# Patient Record
Sex: Female | Born: 1955
Health system: Southern US, Community
[De-identification: ages and names within clinical notes are randomized; demographics above are authoritative.]

## PROBLEM LIST (undated history)

## (undated) ENCOUNTER — Ambulatory Visit

## (undated) DIAGNOSIS — E785 Hyperlipidemia, unspecified: Secondary | ICD-10-CM

## (undated) DIAGNOSIS — I10 Essential (primary) hypertension: Secondary | ICD-10-CM

## (undated) DIAGNOSIS — C4491 Basal cell carcinoma of skin, unspecified: Secondary | ICD-10-CM

## (undated) DIAGNOSIS — M810 Age-related osteoporosis without current pathological fracture: Secondary | ICD-10-CM

## (undated) DIAGNOSIS — B019 Varicella without complication: Secondary | ICD-10-CM

## (undated) DIAGNOSIS — F419 Anxiety disorder, unspecified: Secondary | ICD-10-CM

## (undated) DIAGNOSIS — R1310 Dysphagia, unspecified: Secondary | ICD-10-CM

## (undated) DIAGNOSIS — K921 Melena: Secondary | ICD-10-CM

## (undated) DIAGNOSIS — K219 Gastro-esophageal reflux disease without esophagitis: Secondary | ICD-10-CM

## (undated) DIAGNOSIS — I341 Nonrheumatic mitral (valve) prolapse: Secondary | ICD-10-CM

## (undated) DIAGNOSIS — K828 Other specified diseases of gallbladder: Secondary | ICD-10-CM

## (undated) DIAGNOSIS — I471 Supraventricular tachycardia, unspecified: Secondary | ICD-10-CM

## (undated) HISTORY — DX: Varicella without complication: B01.9

## (undated) HISTORY — DX: Gastro-esophageal reflux disease without esophagitis: K21.9

## (undated) HISTORY — DX: Age-related osteoporosis without current pathological fracture: M81.0

## (undated) HISTORY — DX: Basal cell carcinoma of skin, unspecified: C44.91

## (undated) HISTORY — PX: TONSILLECTOMY: SUR1361

## (undated) HISTORY — DX: Hyperlipidemia, unspecified: E78.5

## (undated) HISTORY — DX: Melena: K92.1

## (undated) HISTORY — DX: Other specified diseases of gallbladder: K82.8

## (undated) HISTORY — DX: Supraventricular tachycardia, unspecified: I47.10

## (undated) HISTORY — DX: Anxiety disorder, unspecified: F41.9

## (undated) HISTORY — DX: Essential (primary) hypertension: I10

---

## 1999-02-24 HISTORY — PX: BREAST BIOPSY: SHX20

## 2013-02-23 HISTORY — PX: COLON SURGERY: SHX602

## 2013-02-23 HISTORY — PX: APPENDECTOMY: SHX54

## 2013-09-24 ENCOUNTER — Encounter (HOSPITAL_COMMUNITY): Payer: Self-pay | Admitting: Emergency Medicine

## 2013-09-24 DIAGNOSIS — K560 Paralytic ileus: Secondary | ICD-10-CM | POA: Diagnosis not present

## 2013-09-24 DIAGNOSIS — I519 Heart disease, unspecified: Secondary | ICD-10-CM | POA: Diagnosis not present

## 2013-09-24 DIAGNOSIS — R1031 Right lower quadrant pain: Secondary | ICD-10-CM | POA: Diagnosis not present

## 2013-09-24 DIAGNOSIS — R1310 Dysphagia, unspecified: Secondary | ICD-10-CM | POA: Diagnosis present

## 2013-09-24 DIAGNOSIS — T40605A Adverse effect of unspecified narcotics, initial encounter: Secondary | ICD-10-CM | POA: Diagnosis not present

## 2013-09-24 DIAGNOSIS — Z7982 Long term (current) use of aspirin: Secondary | ICD-10-CM

## 2013-09-24 DIAGNOSIS — I059 Rheumatic mitral valve disease, unspecified: Secondary | ICD-10-CM | POA: Diagnosis present

## 2013-09-24 DIAGNOSIS — R110 Nausea: Secondary | ICD-10-CM | POA: Diagnosis not present

## 2013-09-24 DIAGNOSIS — Z5331 Laparoscopic surgical procedure converted to open procedure: Secondary | ICD-10-CM

## 2013-09-24 DIAGNOSIS — K3580 Unspecified acute appendicitis: Principal | ICD-10-CM | POA: Diagnosis present

## 2013-09-24 LAB — COMPREHENSIVE METABOLIC PANEL
ALK PHOS: 102 U/L (ref 39–117)
ALT: 15 U/L (ref 0–35)
AST: 18 U/L (ref 0–37)
Albumin: 4.2 g/dL (ref 3.5–5.2)
Anion gap: 15 (ref 5–15)
BUN: 11 mg/dL (ref 6–23)
CO2: 23 mEq/L (ref 19–32)
Calcium: 10 mg/dL (ref 8.4–10.5)
Chloride: 100 mEq/L (ref 96–112)
Creatinine, Ser: 0.78 mg/dL (ref 0.50–1.10)
GFR calc Af Amer: >90 mL/min (ref >90)
GFR, EST NON AFRICAN AMERICAN: 90 mL/min — AB (ref >90)
GLUCOSE: 115 mg/dL — AB (ref 70–99)
POTASSIUM: 4.3 meq/L (ref 3.7–5.3)
Sodium: 138 mEq/L (ref 137–147)
Total Bilirubin: 0.7 mg/dL (ref 0.3–1.2)
Total Protein: 7.8 g/dL (ref 6.0–8.3)

## 2013-09-24 LAB — URINALYSIS, ROUTINE W REFLEX MICROSCOPIC
Bilirubin Urine: NEGATIVE
GLUCOSE, UA: NEGATIVE mg/dL
KETONES UR: NEGATIVE mg/dL
Nitrite: NEGATIVE
Protein, ur: NEGATIVE mg/dL
Specific Gravity, Urine: 1.013 (ref 1.005–1.030)
Urobilinogen, UA: 0.2 mg/dL (ref 0.0–1.0)
pH: 7 (ref 5.0–8.0)

## 2013-09-24 LAB — CBC WITH DIFFERENTIAL/PLATELET
BASOS ABS: 0 10*3/uL (ref 0.0–0.1)
Basophils Relative: 0 % (ref 0–1)
Eosinophils Absolute: 0.2 10*3/uL (ref 0.0–0.7)
Eosinophils Relative: 1 % (ref 0–5)
HCT: 39.3 % (ref 36.0–46.0)
Hemoglobin: 13.6 g/dL (ref 12.0–15.0)
LYMPHS ABS: 1.7 10*3/uL (ref 0.7–4.0)
LYMPHS PCT: 11 % — AB (ref 12–46)
MCH: 31.8 pg (ref 26.0–34.0)
MCHC: 34.6 g/dL (ref 30.0–36.0)
MCV: 91.8 fL (ref 78.0–100.0)
Monocytes Absolute: 1.4 10*3/uL — ABNORMAL HIGH (ref 0.1–1.0)
Monocytes Relative: 9 % (ref 3–12)
NEUTROS ABS: 12.5 10*3/uL — AB (ref 1.7–7.7)
NEUTROS PCT: 79 % — AB (ref 43–77)
PLATELETS: 332 10*3/uL (ref 150–400)
RBC: 4.28 MIL/uL (ref 3.87–5.11)
RDW: 12.9 % (ref 11.5–15.5)
WBC: 15.8 10*3/uL — ABNORMAL HIGH (ref 4.0–10.5)

## 2013-09-24 LAB — LIPASE, BLOOD: Lipase: 30 U/L (ref 11–59)

## 2013-09-24 LAB — URINE MICROSCOPIC-ADD ON

## 2013-09-24 NOTE — ED Notes (Signed)
Pt reports starting on Thursday she started having mis abdominal pain that sometimes radiates to RLQ. Denies nvd. sts she had fever of 101 at home 2 hours ago.

## 2013-09-25 ENCOUNTER — Encounter (HOSPITAL_COMMUNITY): Payer: BC Managed Care – PPO | Admitting: Anesthesiology

## 2013-09-25 ENCOUNTER — Emergency Department (HOSPITAL_COMMUNITY): Payer: BC Managed Care – PPO

## 2013-09-25 ENCOUNTER — Inpatient Hospital Stay (HOSPITAL_COMMUNITY)
Admission: EM | Admit: 2013-09-25 | Discharge: 2013-10-01 | DRG: 330 | Disposition: A | Discharge status: Home / Self Care | Payer: BC Managed Care – PPO | Attending: Surgery | Admitting: Surgery

## 2013-09-25 ENCOUNTER — Observation Stay (HOSPITAL_COMMUNITY): Payer: BC Managed Care – PPO | Admitting: Anesthesiology

## 2013-09-25 ENCOUNTER — Encounter (HOSPITAL_COMMUNITY): Payer: Self-pay | Admitting: Emergency Medicine

## 2013-09-25 ENCOUNTER — Encounter (HOSPITAL_COMMUNITY): Admission: EM | Disposition: A | Discharge status: Home / Self Care | Payer: Self-pay | Source: Home / Self Care

## 2013-09-25 DIAGNOSIS — K3580 Unspecified acute appendicitis: Secondary | ICD-10-CM

## 2013-09-25 DIAGNOSIS — K37 Unspecified appendicitis: Secondary | ICD-10-CM

## 2013-09-25 DIAGNOSIS — Z9049 Acquired absence of other specified parts of digestive tract: Secondary | ICD-10-CM

## 2013-09-25 DIAGNOSIS — R1031 Right lower quadrant pain: Secondary | ICD-10-CM

## 2013-09-25 HISTORY — DX: Nonrheumatic mitral (valve) prolapse: I34.1

## 2013-09-25 HISTORY — PX: LAPAROSCOPIC APPENDECTOMY: SHX408

## 2013-09-25 HISTORY — DX: Dysphagia, unspecified: R13.10

## 2013-09-25 HISTORY — DX: Unspecified appendicitis: K37

## 2013-09-25 LAB — TROPONIN I

## 2013-09-25 LAB — SURGICAL PCR SCREEN
MRSA, PCR: NEGATIVE
Staphylococcus aureus: NEGATIVE

## 2013-09-25 SURGERY — APPENDECTOMY, LAPAROSCOPIC
Anesthesia: General | Site: Abdomen

## 2013-09-25 MED ORDER — ARTIFICIAL TEARS OP OINT
TOPICAL_OINTMENT | OPHTHALMIC | Status: AC
  Filled 2013-09-25: qty 3.5

## 2013-09-25 MED ORDER — PROPOFOL 10 MG/ML IV BOLUS
INTRAVENOUS | Status: DC | PRN
  Administered 2013-09-25: 200 mg via INTRAVENOUS

## 2013-09-25 MED ORDER — 0.9 % SODIUM CHLORIDE (POUR BTL) OPTIME
TOPICAL | Status: DC | PRN
  Administered 2013-09-25 (×2): 1000 mL

## 2013-09-25 MED ORDER — HYDROMORPHONE HCL PF 1 MG/ML IJ SOLN
INTRAMUSCULAR | Status: AC
  Administered 2013-09-25: 0.5 mg via INTRAVENOUS
  Filled 2013-09-25: qty 1

## 2013-09-25 MED ORDER — PANTOPRAZOLE SODIUM 40 MG IV SOLR
40.0000 mg | Freq: Every day | INTRAVENOUS | Status: DC
  Administered 2013-09-25 – 2013-09-27 (×3): 40 mg via INTRAVENOUS
  Filled 2013-09-25 (×6): qty 40

## 2013-09-25 MED ORDER — ONDANSETRON HCL 4 MG/2ML IJ SOLN
INTRAMUSCULAR | Status: AC
  Filled 2013-09-25: qty 2

## 2013-09-25 MED ORDER — LIDOCAINE HCL (CARDIAC) 20 MG/ML IV SOLN
INTRAVENOUS | Status: DC | PRN
  Administered 2013-09-25: 60 mg via INTRAVENOUS

## 2013-09-25 MED ORDER — GLYCOPYRROLATE 0.2 MG/ML IJ SOLN
INTRAMUSCULAR | Status: DC | PRN
  Administered 2013-09-25: 0.2 mg via INTRAVENOUS
  Administered 2013-09-25: 0.6 mg via INTRAVENOUS
  Administered 2013-09-25: 0.2 mg via INTRAVENOUS

## 2013-09-25 MED ORDER — SODIUM CHLORIDE 0.9 % IR SOLN
Status: DC | PRN
  Administered 2013-09-25: 1000 mL

## 2013-09-25 MED ORDER — NEOSTIGMINE METHYLSULFATE 10 MG/10ML IV SOLN
INTRAVENOUS | Status: AC
  Filled 2013-09-25: qty 1

## 2013-09-25 MED ORDER — ONDANSETRON HCL 4 MG/2ML IJ SOLN
4.0000 mg | Freq: Once | INTRAMUSCULAR | Status: AC
  Administered 2013-09-25: 4 mg via INTRAVENOUS
  Filled 2013-09-25: qty 2

## 2013-09-25 MED ORDER — BUPIVACAINE-EPINEPHRINE 0.25% -1:200000 IJ SOLN
INTRAMUSCULAR | Status: DC | PRN
  Administered 2013-09-25: 30 mL

## 2013-09-25 MED ORDER — LIDOCAINE HCL (CARDIAC) 20 MG/ML IV SOLN
INTRAVENOUS | Status: AC
  Filled 2013-09-25: qty 5

## 2013-09-25 MED ORDER — DEXTROSE 5 % IV SOLN
2.0000 g | INTRAVENOUS | Status: DC
  Administered 2013-09-25 – 2013-09-30 (×6): 2 g via INTRAVENOUS
  Filled 2013-09-25 (×7): qty 2

## 2013-09-25 MED ORDER — PROPOFOL 10 MG/ML IV BOLUS
INTRAVENOUS | Status: AC
  Filled 2013-09-25: qty 20

## 2013-09-25 MED ORDER — ONDANSETRON HCL 4 MG/2ML IJ SOLN: 4.0000 mg | Freq: Once | INTRAMUSCULAR | Status: DC | PRN

## 2013-09-25 MED ORDER — MORPHINE SULFATE 4 MG/ML IJ SOLN
4.0000 mg | Freq: Once | INTRAMUSCULAR | Status: AC
  Administered 2013-09-25: 4 mg via INTRAVENOUS
  Filled 2013-09-25: qty 1

## 2013-09-25 MED ORDER — ARTIFICIAL TEARS OP OINT
TOPICAL_OINTMENT | OPHTHALMIC | Status: DC | PRN
  Administered 2013-09-25: 1 via OPHTHALMIC

## 2013-09-25 MED ORDER — FENTANYL CITRATE 0.05 MG/ML IJ SOLN
INTRAMUSCULAR | Status: AC
  Filled 2013-09-25: qty 5

## 2013-09-25 MED ORDER — ENOXAPARIN SODIUM 40 MG/0.4ML ~~LOC~~ SOLN
40.0000 mg | SUBCUTANEOUS | Status: DC
  Administered 2013-09-26 – 2013-09-30 (×5): 40 mg via SUBCUTANEOUS
  Filled 2013-09-25 (×10): qty 0.4

## 2013-09-25 MED ORDER — FENTANYL CITRATE 0.05 MG/ML IJ SOLN
INTRAMUSCULAR | Status: DC | PRN
  Administered 2013-09-25 (×4): 50 ug via INTRAVENOUS
  Administered 2013-09-25: 100 ug via INTRAVENOUS

## 2013-09-25 MED ORDER — BUPIVACAINE-EPINEPHRINE (PF) 0.25% -1:200000 IJ SOLN
INTRAMUSCULAR | Status: AC
  Filled 2013-09-25: qty 30

## 2013-09-25 MED ORDER — KCL IN DEXTROSE-NACL 20-5-0.45 MEQ/L-%-% IV SOLN
INTRAVENOUS | Status: DC
  Administered 2013-09-25 – 2013-09-26 (×4): via INTRAVENOUS
  Filled 2013-09-25 (×9): qty 1000

## 2013-09-25 MED ORDER — GLYCOPYRROLATE 0.2 MG/ML IJ SOLN
INTRAMUSCULAR | Status: AC
  Filled 2013-09-25: qty 3

## 2013-09-25 MED ORDER — LACTATED RINGERS IV SOLN
INTRAVENOUS | Status: DC | PRN
  Administered 2013-09-25 (×2): via INTRAVENOUS

## 2013-09-25 MED ORDER — ROCURONIUM BROMIDE 50 MG/5ML IV SOLN
INTRAVENOUS | Status: AC
  Filled 2013-09-25: qty 1

## 2013-09-25 MED ORDER — KCL IN DEXTROSE-NACL 20-5-0.45 MEQ/L-%-% IV SOLN
INTRAVENOUS | Status: AC
Start: 2013-09-25 — End: 2013-09-26
  Filled 2013-09-25: qty 1000

## 2013-09-25 MED ORDER — IOHEXOL 300 MG/ML  SOLN
100.0000 mL | Freq: Once | INTRAMUSCULAR | Status: AC | PRN
  Administered 2013-09-25: 100 mL via INTRAVENOUS

## 2013-09-25 MED ORDER — SCOPOLAMINE 1 MG/3DAYS TD PT72
MEDICATED_PATCH | TRANSDERMAL | Status: AC
  Filled 2013-09-25: qty 1

## 2013-09-25 MED ORDER — PROPOFOL INFUSION 10 MG/ML OPTIME
INTRAVENOUS | Status: DC | PRN
  Administered 2013-09-25: 25 ug/kg/min via INTRAVENOUS

## 2013-09-25 MED ORDER — LIDOCAINE HCL 4 % MT SOLN
OROMUCOSAL | Status: DC | PRN
  Administered 2013-09-25: 4 mL via TOPICAL

## 2013-09-25 MED ORDER — ROCURONIUM BROMIDE 100 MG/10ML IV SOLN
INTRAVENOUS | Status: DC | PRN
  Administered 2013-09-25: 35 mg via INTRAVENOUS
  Administered 2013-09-25: 15 mg via INTRAVENOUS

## 2013-09-25 MED ORDER — ONDANSETRON HCL 4 MG/2ML IJ SOLN
INTRAMUSCULAR | Status: DC | PRN
  Administered 2013-09-25 (×2): 4 mg via INTRAVENOUS

## 2013-09-25 MED ORDER — PHENYLEPHRINE HCL 10 MG/ML IJ SOLN
INTRAMUSCULAR | Status: AC
  Filled 2013-09-25: qty 1

## 2013-09-25 MED ORDER — SCOPOLAMINE 1 MG/3DAYS TD PT72
MEDICATED_PATCH | TRANSDERMAL | Status: DC | PRN
  Administered 2013-09-25: 1 via TRANSDERMAL

## 2013-09-25 MED ORDER — MORPHINE SULFATE 2 MG/ML IJ SOLN
2.0000 mg | INTRAMUSCULAR | Status: DC | PRN
  Administered 2013-09-25: 2 mg via INTRAVENOUS
  Administered 2013-09-26 (×2): 4 mg via INTRAVENOUS
  Filled 2013-09-25: qty 2
  Filled 2013-09-25: qty 1
  Filled 2013-09-25: qty 2

## 2013-09-25 MED ORDER — MORPHINE SULFATE 2 MG/ML IJ SOLN
2.0000 mg | INTRAMUSCULAR | Status: DC | PRN
  Administered 2013-09-25 (×2): 4 mg via INTRAVENOUS
  Filled 2013-09-25 (×2): qty 2

## 2013-09-25 MED ORDER — HYDROMORPHONE HCL PF 1 MG/ML IJ SOLN
0.2500 mg | INTRAMUSCULAR | Status: DC | PRN
Start: 2013-09-25 — End: 2013-09-25
  Administered 2013-09-25 (×4): 0.5 mg via INTRAVENOUS

## 2013-09-25 MED ORDER — NEOSTIGMINE METHYLSULFATE 10 MG/10ML IV SOLN
INTRAVENOUS | Status: DC | PRN
  Administered 2013-09-25: 4 mg via INTRAVENOUS

## 2013-09-25 MED ORDER — ONDANSETRON HCL 4 MG/2ML IJ SOLN
4.0000 mg | Freq: Four times a day (QID) | INTRAMUSCULAR | Status: DC | PRN
  Administered 2013-09-25 – 2013-09-28 (×8): 4 mg via INTRAVENOUS
  Filled 2013-09-25 (×9): qty 2

## 2013-09-25 MED ORDER — METRONIDAZOLE IN NACL 5-0.79 MG/ML-% IV SOLN
500.0000 mg | Freq: Three times a day (TID) | INTRAVENOUS | Status: DC
  Administered 2013-09-25 – 2013-09-30 (×15): 500 mg via INTRAVENOUS
  Filled 2013-09-25 (×21): qty 100

## 2013-09-25 MED ORDER — IOHEXOL 300 MG/ML  SOLN
25.0000 mL | INTRAMUSCULAR | Status: DC
  Administered 2013-09-25: 25 mL via ORAL

## 2013-09-25 MED ORDER — ALBUMIN HUMAN 5 % IV SOLN
INTRAVENOUS | Status: DC | PRN
  Administered 2013-09-25: 12:00:00 via INTRAVENOUS

## 2013-09-25 SURGICAL SUPPLY — 79 items
APPLIER CLIP ROT 10 11.4 M/L (STAPLE)
BENZOIN TINCTURE PRP APPL 2/3 (GAUZE/BANDAGES/DRESSINGS) IMPLANT
BLADE SURG 10 STRL SS (BLADE) ×3 IMPLANT
BLADE SURG ROTATE 9660 (MISCELLANEOUS) IMPLANT
CANISTER SUCTION 2500CC (MISCELLANEOUS) ×3 IMPLANT
CELLS DAT CNTRL 66122 CELL SVR (MISCELLANEOUS) ×1 IMPLANT
CHLORAPREP W/TINT 26ML (MISCELLANEOUS) ×3 IMPLANT
CLIP APPLIE ROT 10 11.4 M/L (STAPLE) IMPLANT
CONT SPEC 4OZ CLIKSEAL STRL BL (MISCELLANEOUS) ×3 IMPLANT
COVER SURGICAL LIGHT HANDLE (MISCELLANEOUS) ×3 IMPLANT
CUTTER FLEX LINEAR 45M (STAPLE) ×3 IMPLANT
DERMABOND ADVANCED (GAUZE/BANDAGES/DRESSINGS)
DERMABOND ADVANCED .7 DNX12 (GAUZE/BANDAGES/DRESSINGS) IMPLANT
DRAPE UTILITY 15X26 W/TAPE STR (DRAPE) ×6 IMPLANT
ELECT CAUTERY BLADE 6.4 (BLADE) ×3 IMPLANT
ELECT REM PT RETURN 9FT ADLT (ELECTROSURGICAL) ×3
ELECTRODE REM PT RTRN 9FT ADLT (ELECTROSURGICAL) ×1 IMPLANT
ENDOLOOP SUT PDS II  0 18 (SUTURE)
ENDOLOOP SUT PDS II 0 18 (SUTURE) IMPLANT
GAUZE SPONGE 2X2 8PLY STRL LF (GAUZE/BANDAGES/DRESSINGS) IMPLANT
GLOVE BIOGEL M STRL SZ7.5 (GLOVE) ×6 IMPLANT
GLOVE BIOGEL PI IND STRL 6 (GLOVE) ×1 IMPLANT
GLOVE BIOGEL PI IND STRL 6.5 (GLOVE) ×1 IMPLANT
GLOVE BIOGEL PI IND STRL 7.0 (GLOVE) ×3 IMPLANT
GLOVE BIOGEL PI IND STRL 8 (GLOVE) ×1 IMPLANT
GLOVE BIOGEL PI INDICATOR 6 (GLOVE) ×2
GLOVE BIOGEL PI INDICATOR 6.5 (GLOVE) ×2
GLOVE BIOGEL PI INDICATOR 7.0 (GLOVE) ×6
GLOVE BIOGEL PI INDICATOR 8 (GLOVE) ×2
GLOVE ECLIPSE 6.5 STRL STRAW (GLOVE) ×3 IMPLANT
GLOVE SS BIOGEL STRL SZ 6.5 (GLOVE) ×2 IMPLANT
GLOVE SS BIOGEL STRL SZ 7.5 (GLOVE) ×2 IMPLANT
GLOVE SUPERSENSE BIOGEL SZ 6.5 (GLOVE) ×4
GLOVE SUPERSENSE BIOGEL SZ 7.5 (GLOVE) ×4
GLOVE SURG SS PI 7.0 STRL IVOR (GLOVE) ×9 IMPLANT
GOWN STRL REUS W/ TWL LRG LVL3 (GOWN DISPOSABLE) ×3 IMPLANT
GOWN STRL REUS W/ TWL XL LVL3 (GOWN DISPOSABLE) ×1 IMPLANT
GOWN STRL REUS W/TWL LRG LVL3 (GOWN DISPOSABLE) ×6
GOWN STRL REUS W/TWL XL LVL3 (GOWN DISPOSABLE) ×2
KIT BASIN OR (CUSTOM PROCEDURE TRAY) ×3 IMPLANT
KIT ROOM TURNOVER OR (KITS) ×3 IMPLANT
LIGASURE IMPACT 36 18CM CVD LR (INSTRUMENTS) ×3 IMPLANT
NS IRRIG 1000ML POUR BTL (IV SOLUTION) ×3 IMPLANT
PAD ABD 8X10 STRL (GAUZE/BANDAGES/DRESSINGS) ×3 IMPLANT
PAD ARMBOARD 7.5X6 YLW CONV (MISCELLANEOUS) ×6 IMPLANT
PENCIL BUTTON HOLSTER BLD 10FT (ELECTRODE) ×6 IMPLANT
POUCH SPECIMEN RETRIEVAL 10MM (ENDOMECHANICALS) ×3 IMPLANT
RELOAD 45 VASCULAR/THIN (ENDOMECHANICALS) ×3 IMPLANT
RELOAD PROXIMATE 75MM BLUE (ENDOMECHANICALS) ×6 IMPLANT
RELOAD STAPLE TA45 3.5 REG BLU (ENDOMECHANICALS) IMPLANT
RTRCTR WOUND ALEXIS 18CM MED (MISCELLANEOUS) ×3
SCALPEL HARMONIC ACE (MISCELLANEOUS) ×3 IMPLANT
SCISSORS LAP 5X35 DISP (ENDOMECHANICALS) ×3 IMPLANT
SET IRRIG TUBING LAPAROSCOPIC (IRRIGATION / IRRIGATOR) ×3 IMPLANT
SPECIMEN JAR LARGE (MISCELLANEOUS) ×3 IMPLANT
SPECIMEN JAR SMALL (MISCELLANEOUS) ×3 IMPLANT
SPONGE GAUZE 2X2 STER 10/PKG (GAUZE/BANDAGES/DRESSINGS)
SPONGE GAUZE 4X4 12PLY STER LF (GAUZE/BANDAGES/DRESSINGS) ×3 IMPLANT
SPONGE LAP 18X18 X RAY DECT (DISPOSABLE) ×3 IMPLANT
STAPLER GUN LINEAR PROX 60 (STAPLE) ×3 IMPLANT
STAPLER PROXIMATE 75MM BLUE (STAPLE) ×3 IMPLANT
SUT MNCRL AB 4-0 PS2 18 (SUTURE) ×3 IMPLANT
SUT NOVA 1 T20/GS 25DT (SUTURE) ×3 IMPLANT
SUT SILK 2 0 (SUTURE) ×2
SUT SILK 2 0 SH CR/8 (SUTURE) ×3 IMPLANT
SUT SILK 2-0 18XBRD TIE 12 (SUTURE) ×1 IMPLANT
SUT SILK 3 0 (SUTURE) ×2
SUT SILK 3 0 SH CR/8 (SUTURE) ×3 IMPLANT
SUT SILK 3-0 18XBRD TIE 12 (SUTURE) ×1 IMPLANT
SUT VICRYL 0 UR6 27IN ABS (SUTURE) IMPLANT
TOWEL OR 17X24 6PK STRL BLUE (TOWEL DISPOSABLE) IMPLANT
TOWEL OR 17X26 10 PK STRL BLUE (TOWEL DISPOSABLE) ×3 IMPLANT
TRAY FOLEY CATH 16FR SILVER (SET/KITS/TRAYS/PACK) ×3 IMPLANT
TRAY LAPAROSCOPIC (CUSTOM PROCEDURE TRAY) ×3 IMPLANT
TROCAR XCEL BLADELESS 5X75MML (TROCAR) ×6 IMPLANT
TROCAR XCEL BLUNT TIP 100MML (ENDOMECHANICALS) ×3 IMPLANT
TUBE CONNECTING 12'X1/4 (SUCTIONS) ×1
TUBE CONNECTING 12X1/4 (SUCTIONS) ×2 IMPLANT
YANKAUER SUCT BULB TIP NO VENT (SUCTIONS) ×6 IMPLANT

## 2013-09-25 NOTE — Anesthesia Procedure Notes (Signed)
Procedure Name: Intubation Date/Time: 09/25/2013 10:35 AM Performed by: Terrill Mohr Pre-anesthesia Checklist: Patient identified, Emergency Drugs available, Suction available and Patient being monitored Patient Re-evaluated:Patient Re-evaluated prior to inductionOxygen Delivery Method: Circle system utilized Preoxygenation: Pre-oxygenation with 100% oxygen Intubation Type: IV induction Ventilation: Mask ventilation without difficulty Laryngoscope Size: Mac and 3 Grade View: Grade II Tube type: Oral Tube size: 7.5 mm Number of attempts: 1 Airway Equipment and Method: Stylet Placement Confirmation: ETT inserted through vocal cords under direct vision,  breath sounds checked- equal and bilateral and positive ETCO2 Secured at: 22 (cm at teeth) cm Tube secured with: Tape Dental Injury: Teeth and Oropharynx as per pre-operative assessment

## 2013-09-25 NOTE — ED Notes (Signed)
Patient returned from CT

## 2013-09-25 NOTE — Transfer of Care (Signed)
Immediate Anesthesia Transfer of Care Note  Patient: Lenola Lockner  Procedure(s) Performed: Procedure(s):  LAPAROSCOPIC APPENDECTOMY CONVERTED TO LAPAROSCOPIC ASSISTED RIGHT COLECTOMY (N/A)  Patient Location: PACU  Anesthesia Type:General  Level of Consciousness: awake, sedated and patient cooperative  Airway & Oxygen Therapy: Patient Spontanous Breathing and Patient connected to nasal cannula oxygen  Post-op Assessment: Report given to PACU RN, Post -op Vital signs reviewed and stable and Patient moving all extremities  Post vital signs: Reviewed and stable  Complications: No apparent anesthesia complications

## 2013-09-25 NOTE — ED Notes (Signed)
Transporting patient to new room assignment. 

## 2013-09-25 NOTE — Brief Op Note (Addendum)
09/25/2013  12:58 PM  PATIENT:  Donna Hawkins  58 y.o. female  PRE-OPERATIVE DIAGNOSIS:  Appendicitis  POST-OPERATIVE DIAGNOSIS:  Gangrenous appendicitis , possible cecal mass  PROCEDURE:  Procedure(s):  LAPAROSCOPIC APPENDECTOMY CONVERTED TO LAPAROSCOPIC ASSISTED RIGHT COLECTOMY (N/A)  SURGEON:  Surgeon(s) and Role:    * Gayland Curry, MD - Primary  PHYSICIAN ASSISTANT: none  ASSISTANTS: none   ANESTHESIA:   general  EBL:  Total I/O In: 1250 [I.V.:1000; IV Piggyback:250] Out: 100 [Urine:100]  BLOOD ADMINISTERED:none  DRAINS: Urinary Catheter (Foley)   LOCAL MEDICATIONS USED:  MARCAINE     SPECIMEN:  Source of Specimen:  TI and rt colon with gangrenous appendix  DISPOSITION OF SPECIMEN:  PATHOLOGY  COUNTS:  YES  TOURNIQUET:  * No tourniquets in log *  DICTATION: .Other Dictation: Dictation Number [id number]   PLAN OF CARE: PACU then SDU  PATIENT DISPOSITION:  PACU - hemodynamically stable.   Delay start of Pharmacological VTE agent (>24hrs) due to surgical blood loss or risk of bleeding: not applicable   Leighton Ruff. [name], MD, FACS General, Bariatric, & Minimally Invasive Surgery [facility], [place]

## 2013-09-25 NOTE — ED Provider Notes (Signed)
CSN: [id number]     Arrival date & time 09/24/13  1953 History   First MD Initiated Contact with Patient 09/25/13 0019     Chief Complaint  Patient presents with  . Abdominal Pain     (Consider location/radiation/quality/duration/timing/severity/associated sxs/prior Treatment) HPI 58 year old female presents to the emergency department with complaint of worsening abdominal pain.  She reports that she had abdominal discomfort starting on Thursday, pain has progressively gotten worse over the next 4 days.  She denies any nausea vomiting diarrhea.  She had fever today to 101. No prior abdominal surgeries.  Pain is worse in the right lower quadrant.  She denies any urinary symptoms, no vaginal discharge. Past Medical History  Diagnosis Date  . Dysphagia   . Mitral valve prolapse    Past Surgical History  Procedure Laterality Date  . Tonsillectomy     No family history on file. History  Substance Use Topics  . Smoking status: Never Smoker   . Smokeless tobacco: Not on file  . Alcohol Use: Yes   OB History   Grav Para Term Preterm Abortions TAB SAB Ect Mult Living                 Review of Systems   See History of Present Illness; otherwise all other systems are reviewed and negative  Allergies  Review of patient's allergies indicates no known allergies.  Home Medications   Prior to Admission medications   Medication Sig Start Date End Date Taking? Authorizing Provider  aspirin 81 MG chewable tablet Chew 648 mg by mouth daily as needed for headache.   Yes Historical Provider, MD  Multiple Vitamin (MULTIVITAMIN WITH MINERALS) TABS tablet Take 1 tablet by mouth daily.   Yes Historical Provider, MD  omega-3 acid ethyl esters (LOVAZA) 1 G capsule Take 1 g by mouth daily.   Yes Historical Provider, MD  OVER THE COUNTER MEDICATION Take 0.5 tablets by mouth at bedtime as needed (for sleep OTC).   Yes Historical Provider, MD   BP 157/91  Pulse 82  Temp(Src) 98.7 F (37.1 C)  (Oral)  Resp 15  Ht 5\' 7"  (1.702 m)  Wt 131 lb 8 oz (59.648 kg)  BMI 20.59 kg/m2  SpO2 100% Physical Exam  Nursing note and vitals reviewed. Constitutional: She is oriented to person, place, and time. She appears well-developed and well-nourished.  HENT:  Head: Normocephalic and atraumatic.  Nose: Nose normal.  Mouth/Throat: Oropharynx is clear and moist.  Eyes: Conjunctivae and EOM are normal. Pupils are equal, round, and reactive to light.  Neck: Normal range of motion. Neck supple. No JVD present. No tracheal deviation present. No thyromegaly present.  Cardiovascular: Normal rate, regular rhythm, normal heart sounds and intact distal pulses.  Exam reveals no gallop and no friction rub.   No murmur heard. Pulmonary/Chest: Effort normal and breath sounds normal. No stridor. No respiratory distress. She has no wheezes. She has no rales. She exhibits no tenderness.  Abdominal: Soft. Bowel sounds are normal. She exhibits no distension and no mass. There is tenderness (patient has significant tenderness with rebound to right lower quadrant at McBurney's point). There is rebound. There is no guarding.  Musculoskeletal: Normal range of motion. She exhibits no edema and no tenderness.  Lymphadenopathy:    She has no cervical adenopathy.  Neurological: She is alert and oriented to person, place, and time. She exhibits normal muscle tone. Coordination normal.  Skin: Skin is warm and dry. No rash noted. No erythema. No  pallor.  Psychiatric: She has a normal mood and affect. Her behavior is normal. Judgment and thought content normal.    ED Course  Procedures (including critical care time) Labs Review Labs Reviewed  CBC WITH DIFFERENTIAL - Abnormal; Notable for the following:    WBC 15.8 (*)    Neutrophils Relative % 79 (*)    Neutro Abs 12.5 (*)    Lymphocytes Relative 11 (*)    Monocytes Absolute 1.4 (*)    All other components within normal limits  COMPREHENSIVE METABOLIC PANEL -  Abnormal; Notable for the following:    Glucose, Bld 115 (*)    GFR calc non Af Amer 90 (*)    All other components within normal limits  URINALYSIS, ROUTINE W REFLEX MICROSCOPIC - Abnormal; Notable for the following:    APPearance CLOUDY (*)    Hgb urine dipstick SMALL (*)    Leukocytes, UA TRACE (*)    All other components within normal limits  LIPASE, BLOOD  URINE MICROSCOPIC-ADD ON    Imaging Review Ct Abdomen Pelvis W Contrast  09/25/2013   ADDENDUM REPORT: 09/25/2013 03:25  ADDENDUM: There is a dictation error in the body portion of the report. The body should stage findings are consistent with acute appendicitis (not pancreatitis). This is correctly stated in the impression portion of the report.   Electronically Signed   By: Jeannine Boga M.D.   On: 09/25/2013 03:25   09/25/2013   CLINICAL DATA:  ABDOMINAL PAIN concern for appendicitis, concern for appendicitis.  EXAM: CT ABDOMEN AND PELVIS WITH CONTRAST  TECHNIQUE: Multidetector CT imaging of the abdomen and pelvis was performed using the standard protocol following bolus administration of intravenous contrast.  CONTRAST:  148mL OMNIPAQUE IOHEXOL 300 MG/ML  SOLN  COMPARISON:  None.  FINDINGS: Mild subsegmental atelectasis present dependently within the visualized lung bases.  The liver demonstrates a normal contrast enhanced appearance. Gallbladder within normal limits. No biliary ductal dilatation. The spleen, adrenal glands, and pancreas demonstrate a normal contrast enhanced appearance.  Kidneys are equal size with symmetric enhancement. No nephrolithiasis, hydronephrosis, or focal enhancing renal mass.  Stomach within normal limits.  No evidence of bowel obstruction.  The appendix is well visualized in the right lower quadrant, and is markedly dilated up to 1.3 cm with mucosal enhancement and periappendiceal fat stranding. Findings are consistent with acute pancreatitis. No free air to suggest perforation. There is associated free  fluid within the right pelvic cul-de-sac and right lower quadrant, likely reactive in nature. No periappendiceal abscess.  Bladder within normal limits.  Uterus and ovaries are unremarkable.  No pathologically enlarged intra-abdominal pelvic lymph nodes. Normal intravascular enhancement seen throughout the intra-abdominal aorta and its branch vessels.  No acute osseous abnormality. No worrisome lytic or blastic osseous lesions.  IMPRESSION: Findings consistent with acute appendicitis. No evidence of perforation.  Electronically Signed: By: Jeannine Boga M.D. On: 09/25/2013 03:09     EKG Interpretation None      MDM   Final diagnoses:  Acute appendicitis, unspecified acute appendicitis type    58 year old female with right lower quadrant pain fever elevated white blood cell count concerning for appendicitis.  Differential also includes diverticulitis.  Plan for CT abdomen pelvis with contrast.   3:32 AM CT scan with acute appendicitis.  We'll discuss with surgery.  Patient has been updated on findings and plan.   Kalman Drape, MD 09/25/13 [phone]

## 2013-09-25 NOTE — Anesthesia Preprocedure Evaluation (Addendum)
Anesthesia Evaluation  Patient identified by MRN, date of birth, ID band Patient awake    Reviewed: Allergy & Precautions, H&P , NPO status , Patient's Chart, lab work & pertinent test results  Airway       Dental   Pulmonary          Cardiovascular + Valvular Problems/Murmurs MVP     Neuro/Psych    GI/Hepatic   Endo/Other    Renal/GU      Musculoskeletal   Abdominal   Peds  Hematology   Anesthesia Other Findings Dysphagia  Reproductive/Obstetrics                           Anesthesia Physical Anesthesia Plan  ASA: I  Anesthesia Plan: General   Post-op Pain Management:    Induction: Intravenous  Airway Management Planned: Oral ETT  Additional Equipment:   Intra-op Plan:   Post-operative Plan: Extubation in OR  Informed Consent:   Plan Discussed with: CRNA, Anesthesiologist and Surgeon  Anesthesia Plan Comments:         Anesthesia Quick Evaluation

## 2013-09-25 NOTE — Anesthesia Postprocedure Evaluation (Signed)
  Anesthesia Post-op Note  Patient: Donna Hawkins  Procedure(s) Performed: Procedure(s):  LAPAROSCOPIC APPENDECTOMY CONVERTED TO LAPAROSCOPIC ASSISTED RIGHT COLECTOMY (N/A)  Patient Location: PACU  Anesthesia Type:General  Level of Consciousness: awake, alert  and oriented  Airway and Oxygen Therapy: Patient Spontanous Breathing and Patient connected to nasal cannula oxygen  Post-op Pain: mild  Post-op Assessment: Post-op Vital signs reviewed, Patient's Cardiovascular Status Stable, Respiratory Function Stable, Patent Airway and Pain level controlled  Post-op Vital Signs: stable  Last Vitals:  Filed Vitals:   09/25/13 1800  BP: 128/70  Pulse: 109  Temp:   Resp: 19    Complications: No apparent anesthesia complications

## 2013-09-25 NOTE — Progress Notes (Signed)
Subjective: Pt still with some RLQ pain. Nervous about surgery  Objective: Vital signs in last 24 hours: Temp:  [98.7 F (37.1 C)] 98.7 F (37.1 C) (08/02 2055) Pulse Rate:  [82-96] 82 (08/03 0500) Resp:  [14-22] 16 (08/03 0500) BP: (127-174)/(57-94) 143/72 mmHg (08/03 0500) SpO2:  [98 %-100 %] 100 % (08/03 0500) Weight:  [131 lb 8 oz (59.648 kg)] 131 lb 8 oz (59.648 kg) (08/02 2055) Last BM Date: 09/23/13  Intake/Output from previous day:   Intake/Output this shift:    Alert, a little anxious cta b/l Reg Soft, nd, RLQ TTP  Lab Results:   Recent Labs  09/24/13 2058  WBC 15.8*  HGB 13.6  HCT 39.3  PLT 332   BMET  Recent Labs  09/24/13 2058  NA 138  K 4.3  CL 100  CO2 23  GLUCOSE 115*  BUN 11  CREATININE 0.78  CALCIUM 10.0   PT/INR No results found for this basename: LABPROT, INR,  in the last 72 hours ABG No results found for this basename: PHART, PCO2, PO2, HCO3,  in the last 72 hours  Studies/Results: Ct Abdomen Pelvis W Contrast  09/25/2013   ADDENDUM REPORT: 09/25/2013 03:51  ADDENDUM: In addition to the above findings for acute appendicitis, a possible underlying mucinous neoplasm of the appendix could be considered given the relative marked appendiceal dilatation and hypodense fluid signal intensity seen throughout the appendiceal lumen. No evidence of metastatic mucinous neoplasm identified in the abdomen (pseudomyxoma peritonei or other peritoneal metastasis).  These results were called by telephone at the time of interpretation on 09/25/2013 at 3:40 am to Dr. Linton Flemings , who verbally acknowledged these results. Findings were also communicated to Dr. Grandville Silos of the surgical service at 3:49 a.m. on 09/25/2013.   Electronically Signed   By: Jeannine Boga M.D.   On: 09/25/2013 03:51   09/25/2013   ADDENDUM REPORT: 09/25/2013 03:25  ADDENDUM: There is a dictation error in the body portion of the report. The body should stage findings are consistent  with acute appendicitis (not pancreatitis). This is correctly stated in the impression portion of the report.   Electronically Signed   By: Jeannine Boga M.D.   On: 09/25/2013 03:25   09/25/2013   CLINICAL DATA:  ABDOMINAL PAIN concern for appendicitis, concern for appendicitis.  EXAM: CT ABDOMEN AND PELVIS WITH CONTRAST  TECHNIQUE: Multidetector CT imaging of the abdomen and pelvis was performed using the standard protocol following bolus administration of intravenous contrast.  CONTRAST:  123mL OMNIPAQUE IOHEXOL 300 MG/ML  SOLN  COMPARISON:  None.  FINDINGS: Mild subsegmental atelectasis present dependently within the visualized lung bases.  The liver demonstrates a normal contrast enhanced appearance. Gallbladder within normal limits. No biliary ductal dilatation. The spleen, adrenal glands, and pancreas demonstrate a normal contrast enhanced appearance.  Kidneys are equal size with symmetric enhancement. No nephrolithiasis, hydronephrosis, or focal enhancing renal mass.  Stomach within normal limits.  No evidence of bowel obstruction.  The appendix is well visualized in the right lower quadrant, and is markedly dilated up to 1.3 cm with mucosal enhancement and periappendiceal fat stranding. Findings are consistent with acute pancreatitis. No free air to suggest perforation. There is associated free fluid within the right pelvic cul-de-sac and right lower quadrant, likely reactive in nature. No periappendiceal abscess.  Bladder within normal limits.  Uterus and ovaries are unremarkable.  No pathologically enlarged intra-abdominal pelvic lymph nodes. Normal intravascular enhancement seen throughout the intra-abdominal aorta and its branch vessels.  No acute osseous abnormality. No worrisome lytic or blastic osseous lesions.  IMPRESSION: Findings consistent with acute appendicitis. No evidence of perforation.  Electronically Signed: By: Jeannine Boga M.D. On: 09/25/2013 03:09     Anti-infectives: Anti-infectives   Start     Dose/Rate Route Frequency Ordered Stop   09/25/13 0500  metroNIDAZOLE (FLAGYL) IVPB 500 mg     500 mg 100 mL/hr over 60 Minutes Intravenous Every 8 hours 09/25/13 0447     09/25/13 0500  cefTRIAXone (ROCEPHIN) 2 g in dextrose 5 % 50 mL IVPB     2 g 100 mL/hr over 30 Minutes Intravenous Every 24 hours 09/25/13 0447        Assessment/Plan: Acute appendicitis.   I recommended operative management along with IV antibiotics.  We discussed laparoscopic appendectomy. We discussed the risk and benefits of surgery including but not limited to bleeding, infection, injury to surrounding structures, need to convert to an open procedure, blood clot formation, post operative abscess or wound infection, staple line complications such as leak or bleeding, hernia formation, post operative ileus, need for additional procedures, anesthesia complications, and the typical postoperative course. I explained that the patient should expect a good improvement in their symptoms.  Leighton Ruff. [name], MD, FACS General, Bariatric, & Minimally Invasive Surgery [facility], [place]   LOS: 0 days    Gayland Curry 09/25/2013

## 2013-09-25 NOTE — Op Note (Signed)
NAMESHELBI, VACCARO NO.:  [id number]  MEDICAL RECORD NO.:  [id number]  LOCATION:  2C05C                        FACILITY:  [place]  PHYSICIAN:  Leighton Ruff. [name], MD, FACSDATE OF BIRTH:  11-May-1955  DATE OF PROCEDURE:  09/25/2013 DATE OF DISCHARGE:                              OPERATIVE REPORT   PREOPERATIVE DIAGNOSIS:  Acute appendicitis.  POSTOPERATIVE DIAGNOSIS:  Gangrenous appendicitis, possible cecal mass.  PROCEDURE:  Laparoscopic appendectomy converted to laparoscopic-assisted right colectomy.  SURGEON:  Leighton Ruff. [name], MD, FACS.  ANESTHESIA:  General.  ESTIMATED BLOOD LOSS:  Less than 100.  SPECIMEN:  Terminal ileum and right colon with gangrenous appendix.  INDICATIONS FOR PROCEDURE:  The patient is a very pleasant, Caucasian female, who presented to our service and was admitted yesterday early this morning with 3-day history of mid abdominal pain that gradually localized to her right lower quadrant.  She underwent CT scan in the emergency department which demonstrated a markedly dilated appendix. Findings was acute appendicitis.  There was nothing to suggest a free perforation on the CT.  There was a little bit of fluid, but no peripendular abscess.  We discussed the risks and benefits of surgery including, but not limited to, bleeding, infection, injury to surrounding structures, need to convert to an open procedure and more extensive resection, blood clot formation, abscess formation, hernia formation, and general anesthesia complications.  We did discuss the potential need for bowel resection, if there was a question about the staple line.  We discussed the typical postop recovery course.  She elected to proceed with surgery.  DESCRIPTION OF PROCEDURE:  After obtaining informed consent, the patient was taken to the operating room 1 at [facility] placed supine on the operating room table.  General endotracheal anesthesia  was established.  A surgical time-out was performed.  She was prepped and draped in usual standard surgical fashion with ChloraPrep.  A Foley catheter had been placed.  She was on therapeutic antibiotics.  A surgical time-out was performed.  Local was infiltrated at the base of her umbilicus.  Next, an incision was made just below her umbilicus. The fascia was grasped and lifted anteriorly.  The fascia was incised and a 12 mm Hasson trocar was placed and pneumoperitoneum was smoothly established up to a patient pressure of 15 mmHg.  Laparoscope was advanced.  There was no evidence of injury to surrounding structures.  A 5 mm trocar was placed in the suprapubic location and 1 in the left lower quadrant all under direct visualization after local had been infiltrated.  The patient was placed in Trendelenburg and rotated to the left.  The small bowel was swept out of the pelvis.  The tip of the cecum was very thickened and hyperemic.  The terminal ileum was also inflamed and thickened.  The midportion of the appendix was inflamed and indurated in gangrenous and appeared that the inflammation and the necrosis extended toward the base.  I grabbed the distal part of the appendix and when I did the proximal appendix essentially ruptured leaking yellow white mucinous material.  I quickly evacuated that with the suction irrigator catheter.  I took down some  of the mesoappendix with a Harmonic Scalpel with the more I dissected close to the base of the appendix, it became quite obvious that the base of the appendix was not intact.  I took down some of the lateral attachments of the cecum and right colon with the Harmonic Scalpel, mobilizing it to see if I could get a stapler across the cecum without compromising the terminal ileum.  The more I mobilized the cecum and the right colon it became quite obvious that it would not accommodate the stapler due to the induration and inflammation of the cecum.   Therefore, I decided to make a small mini midline incision extending from the Hasson trocar down a several inches.  A wound protector was placed.  Because when I exteriorized the colon, I felt what appeared to be a hard mass in the cecum.  I was concerned that she might have in fact a cecal mass which had caused the appendicitis, therefore, I decided to do a formal right hemicolectomy.  She also had adenopathy in the mesentery and it was unclear if this was due to reactive inflammation or true carcinoma and, therefore, I decided just to do a formal right hemicolectomy.  The terminal ileum was divided with a GIA stapler with a 75 mm blue load.  I identified the middle colic vessels and divided the transverse colon just to the right of the middle colic vessels and at the right branch of the middle colic vessel with another fire of the GIA stapler.  I then took down the mesentery in a serial fashion using the LigaSure device going down to the near the base of the mesentery near the ileocolonic pedicle.  The ileocolonic pedicle was clamped with a Claiborne Billings and then ligated twice with a 2-0 silk suture.  I had previously identified the duodenum prior to resecting any bowel.  I then brought the terminal ileum and the transverse colon together in a side-by-side fashion, aligning them with a stay suture.  I ensured that the small bowel was not twisted.  Enterotomies were made in the terminal ileum and in the transverse colon.  One limb of the GIA 75 stapler with a blue load was placed through each enterotomy and the staples brought together and fired to create a common channel.  A TIA 60 stapler was used to close the defect.  The mesenteric defect was closed with interrupted 2-0 silk sutures.  The tension relieving suture was placed in the crotch of the anastomosis.  The anastomosis was widely patent.  It had excellent blood supply.  The staple line was invaginated with 3-0 silk sutures in a Lembert  fashion.  The abdomen was copiously irrigated.  We then changed gloves and removed the wound protector.  The midline fascia was closed with interrupted #1 Novafil.  I then reestablished pneumoperitoneum, inspected the fascia closure.  There was nothing trapped within our closure.  The fascia was airtight.  The 2 trocars were removed.  The pneumoperitoneum was released.  The 2 trocar sites were closed with a 4- 0 Monocryl and the midline subcutaneous base was left open and packed with a moist 4 x 4 and covered with gauze.  The patient was extubated and taken to the recovery room in stable condition.  It should be noted that when we converted to an open procedure and around the same time that I lifted up and exteriorized the bowel, the patient had a vagal episode and there where 1 or 2 beats of  asystole.  She was given Robinul and quickly recovered with no additional EKG findings.  She will be getting an EKG in PACU and going to step-down for monitoring.  All needle, instrument, and sponge counts were correct x2.  The patient tolerated the procedure well.     Leighton Ruff. [name], MD, FACS     EMW/MEDQ  D:  09/25/2013  T:  09/25/2013  Job:  [id number]

## 2013-09-25 NOTE — Interval H&P Note (Signed)
History and Physical Interval Note:  09/25/2013 10:07 AM  Donna Hawkins  has presented today for surgery, with the diagnosis of Appendicitis  The various methods of treatment have been discussed with the patient and family. After consideration of risks, benefits and other options for treatment, the patient has consented to  Procedure(s): APPENDECTOMY LAPAROSCOPIC (N/A) as a surgical intervention .  The patient's history has been reviewed, patient examined, no change in status, stable for surgery.  I have reviewed the patient's chart and labs.  Questions were answered to the patient's satisfaction.    Leighton Ruff. [name], MD, [place], Bariatric, & Minimally Invasive Surgery [facility], [place]   [facility]

## 2013-09-25 NOTE — H&P (Signed)
Donna Hawkins is an 58 y.o. female.   Chief Complaint: Right lower quadrant abdominal pain HPI: Patient presented to the emergency room with three-day history of mid abdominal pain that has gradually localized to her right lower quadrant. No significant nausea or vomiting. The pain initially was mild but gradually worsened. No change in bowel function. Evaluation in the emergency department demonstrated leukocytosis and CT scan of the abdomen and pelvis is consistent with acute appendicitis.  Past Medical History  Diagnosis Date  . Dysphagia   . Mitral valve prolapse     Past Surgical History  Procedure Laterality Date  . Tonsillectomy      No family history on file. Social History:  reports that she has never smoked. She does not have any smokeless tobacco history on file. She reports that she drinks alcohol. She reports that she does not use illicit drugs.  Allergies: No Known Allergies   (Not in a hospital admission)  Results for orders placed during the hospital encounter of 09/25/13 (from the past 48 hour(s))  CBC WITH DIFFERENTIAL     Status: Abnormal   Collection Time    09/24/13  8:58 PM      Result Value Ref Range   WBC 15.8 (*) 4.0 - 10.5 K/uL   RBC 4.28  3.87 - 5.11 MIL/uL   Hemoglobin 13.6  12.0 - 15.0 g/dL   HCT 39.3  36.0 - 46.0 %   MCV 91.8  78.0 - 100.0 fL   MCH 31.8  26.0 - 34.0 pg   MCHC 34.6  30.0 - 36.0 g/dL   RDW 12.9  11.5 - 15.5 %   Platelets 332  150 - 400 K/uL   Neutrophils Relative % 79 (*) 43 - 77 %   Neutro Abs 12.5 (*) 1.7 - 7.7 K/uL   Lymphocytes Relative 11 (*) 12 - 46 %   Lymphs Abs 1.7  0.7 - 4.0 K/uL   Monocytes Relative 9  3 - 12 %   Monocytes Absolute 1.4 (*) 0.1 - 1.0 K/uL   Eosinophils Relative 1  0 - 5 %   Eosinophils Absolute 0.2  0.0 - 0.7 K/uL   Basophils Relative 0  0 - 1 %   Basophils Absolute 0.0  0.0 - 0.1 K/uL  COMPREHENSIVE METABOLIC PANEL     Status: Abnormal   Collection Time    09/24/13  8:58 PM      Result Value Ref  Range   Sodium 138  137 - 147 mEq/L   Potassium 4.3  3.7 - 5.3 mEq/L   Chloride 100  96 - 112 mEq/L   CO2 23  19 - 32 mEq/L   Glucose, Bld 115 (*) 70 - 99 mg/dL   BUN 11  6 - 23 mg/dL   Creatinine, Ser 0.78  0.50 - 1.10 mg/dL   Calcium 10.0  8.4 - 10.5 mg/dL   Total Protein 7.8  6.0 - 8.3 g/dL   Albumin 4.2  3.5 - 5.2 g/dL   AST 18  0 - 37 U/L   ALT 15  0 - 35 U/L   Alkaline Phosphatase 102  39 - 117 U/L   Total Bilirubin 0.7  0.3 - 1.2 mg/dL   GFR calc non Af Amer 90 (*) >90 mL/min   GFR calc Af Amer >90  >90 mL/min   Comment: (NOTE)     The eGFR has been calculated using the CKD EPI equation.     This calculation has not  been validated in all clinical situations.     eGFR's persistently <90 mL/min signify possible Chronic Kidney     Disease.   Anion gap 15  5 - 15  LIPASE, BLOOD     Status: None   Collection Time    09/24/13  8:58 PM      Result Value Ref Range   Lipase 30  11 - 59 U/L  URINALYSIS, ROUTINE W REFLEX MICROSCOPIC     Status: Abnormal   Collection Time    09/24/13 10:14 PM      Result Value Ref Range   Color, Urine YELLOW  YELLOW   APPearance CLOUDY (*) CLEAR   Specific Gravity, Urine 1.013  1.005 - 1.030   pH 7.0  5.0 - 8.0   Glucose, UA NEGATIVE  NEGATIVE mg/dL   Hgb urine dipstick SMALL (*) NEGATIVE   Bilirubin Urine NEGATIVE  NEGATIVE   Ketones, ur NEGATIVE  NEGATIVE mg/dL   Protein, ur NEGATIVE  NEGATIVE mg/dL   Urobilinogen, UA 0.2  0.0 - 1.0 mg/dL   Nitrite NEGATIVE  NEGATIVE   Leukocytes, UA TRACE (*) NEGATIVE  URINE MICROSCOPIC-ADD ON     Status: None   Collection Time    09/24/13 10:14 PM      Result Value Ref Range   Squamous Epithelial / LPF RARE  RARE   WBC, UA 3-6  <3 WBC/hpf   RBC / HPF 3-6  <3 RBC/hpf   Bacteria, UA RARE  RARE   Ct Abdomen Pelvis W Contrast  09/25/2013   ADDENDUM REPORT: 09/25/2013 03:51  ADDENDUM: In addition to the above findings for acute appendicitis, a possible underlying mucinous neoplasm of the appendix could  be considered given the relative marked appendiceal dilatation and hypodense fluid signal intensity seen throughout the appendiceal lumen. No evidence of metastatic mucinous neoplasm identified in the abdomen (pseudomyxoma peritonei or other peritoneal metastasis).  These results were called by telephone at the time of interpretation on 09/25/2013 at 3:40 am to Dr. Linton Flemings , who verbally acknowledged these results. Findings were also communicated to Dr. Grandville Silos of the surgical service at 3:49 a.m. on 09/25/2013.   Electronically Signed   By: Jeannine Boga M.D.   On: 09/25/2013 03:51   09/25/2013   ADDENDUM REPORT: 09/25/2013 03:25  ADDENDUM: There is a dictation error in the body portion of the report. The body should stage findings are consistent with acute appendicitis (not pancreatitis). This is correctly stated in the impression portion of the report.   Electronically Signed   By: Jeannine Boga M.D.   On: 09/25/2013 03:25   09/25/2013   CLINICAL DATA:  ABDOMINAL PAIN concern for appendicitis, concern for appendicitis.  EXAM: CT ABDOMEN AND PELVIS WITH CONTRAST  TECHNIQUE: Multidetector CT imaging of the abdomen and pelvis was performed using the standard protocol following bolus administration of intravenous contrast.  CONTRAST:  144m OMNIPAQUE IOHEXOL 300 MG/ML  SOLN  COMPARISON:  None.  FINDINGS: Mild subsegmental atelectasis present dependently within the visualized lung bases.  The liver demonstrates a normal contrast enhanced appearance. Gallbladder within normal limits. No biliary ductal dilatation. The spleen, adrenal glands, and pancreas demonstrate a normal contrast enhanced appearance.  Kidneys are equal size with symmetric enhancement. No nephrolithiasis, hydronephrosis, or focal enhancing renal mass.  Stomach within normal limits.  No evidence of bowel obstruction.  The appendix is well visualized in the right lower quadrant, and is markedly dilated up to 1.3 cm with mucosal  enhancement and periappendiceal  fat stranding. Findings are consistent with acute pancreatitis. No free air to suggest perforation. There is associated free fluid within the right pelvic cul-de-sac and right lower quadrant, likely reactive in nature. No periappendiceal abscess.  Bladder within normal limits.  Uterus and ovaries are unremarkable.  No pathologically enlarged intra-abdominal pelvic lymph nodes. Normal intravascular enhancement seen throughout the intra-abdominal aorta and its branch vessels.  No acute osseous abnormality. No worrisome lytic or blastic osseous lesions.  IMPRESSION: Findings consistent with acute appendicitis. No evidence of perforation.  Electronically Signed: By: Jeannine Boga M.D. On: 09/25/2013 03:09    Review of Systems  Constitutional: Negative for fever and chills.  HENT: Negative.   Eyes: Negative.   Respiratory: Negative.   Gastrointestinal: Positive for abdominal pain. Negative for nausea, vomiting, constipation and blood in stool.  Genitourinary: Negative.   Musculoskeletal: Negative.   Skin: Negative.   Neurological: Negative.   Endo/Heme/Allergies: Negative.   Psychiatric/Behavioral: Negative.     Blood pressure 127/57, pulse 82, temperature 98.7 F (37.1 C), temperature source Oral, resp. rate 14, height 5' 7"  (1.702 m), weight 131 lb 8 oz (59.648 kg), SpO2 99.00%. Physical Exam  Constitutional: She is oriented to person, place, and time. She appears well-developed and well-nourished.  HENT:  Head: Normocephalic and atraumatic.  Nose: Nose normal.  Mouth/Throat: Oropharynx is clear and moist. No oropharyngeal exudate.  Eyes: EOM are normal. Right eye exhibits no discharge. Left eye exhibits no discharge. No scleral icterus.  Neck: Normal range of motion. Neck supple.  Cardiovascular: Normal rate and normal heart sounds.   Respiratory: Effort normal and breath sounds normal. No respiratory distress. She has no wheezes.  GI: Soft. She  exhibits no distension. There is tenderness. There is guarding. There is no rebound.  Tender right lower corner with voluntary guarding, no generalized peritonitis, hypoactive bowel sounds  Musculoskeletal: Normal range of motion.  Neurological: She is alert and oriented to person, place, and time.  Skin: Skin is warm.  Psychiatric: She has a normal mood and affect.     Assessment/Plan Acute appendicitis - will admit, give IV fluids IV antibiotics, and plan laparoscopic appendectomy later today. I will discuss with my partner Dr. [name]. Additionally, her appendix is enlarged with hypodense fluid inside. I did let her know there is a chance this is due to a tumor of her appendix. I answered her questions and she is agreeable.   Tupac Jeffus E 09/25/2013, 4:11 AM

## 2013-09-25 NOTE — H&P (View-Only) (Signed)
Subjective: Pt still with some RLQ pain. Nervous about surgery  Objective: Vital signs in last 24 hours: Temp:  [98.7 F (37.1 C)] 98.7 F (37.1 C) (08/02 2055) Pulse Rate:  [82-96] 82 (08/03 0500) Resp:  [14-22] 16 (08/03 0500) BP: (127-174)/(57-94) 143/72 mmHg (08/03 0500) SpO2:  [98 %-100 %] 100 % (08/03 0500) Weight:  [131 lb 8 oz (59.648 kg)] 131 lb 8 oz (59.648 kg) (08/02 2055) Last BM Date: 09/23/13  Intake/Output from previous day:   Intake/Output this shift:    Alert, a little anxious cta b/l Reg Soft, nd, RLQ TTP  Lab Results:   Recent Labs  09/24/13 2058  WBC 15.8*  HGB 13.6  HCT 39.3  PLT 332   BMET  Recent Labs  09/24/13 2058  NA 138  K 4.3  CL 100  CO2 23  GLUCOSE 115*  BUN 11  CREATININE 0.78  CALCIUM 10.0   PT/INR No results found for this basename: LABPROT, INR,  in the last 72 hours ABG No results found for this basename: PHART, PCO2, PO2, HCO3,  in the last 72 hours  Studies/Results: Ct Abdomen Pelvis W Contrast  09/25/2013   ADDENDUM REPORT: 09/25/2013 03:51  ADDENDUM: In addition to the above findings for acute appendicitis, a possible underlying mucinous neoplasm of the appendix could be considered given the relative marked appendiceal dilatation and hypodense fluid signal intensity seen throughout the appendiceal lumen. No evidence of metastatic mucinous neoplasm identified in the abdomen (pseudomyxoma peritonei or other peritoneal metastasis).  These results were called by telephone at the time of interpretation on 09/25/2013 at 3:40 am to Dr. Linton Flemings , who verbally acknowledged these results. Findings were also communicated to Dr. Grandville Silos of the surgical service at 3:49 a.m. on 09/25/2013.   Electronically Signed   By: Jeannine Boga M.D.   On: 09/25/2013 03:51   09/25/2013   ADDENDUM REPORT: 09/25/2013 03:25  ADDENDUM: There is a dictation error in the body portion of the report. The body should stage findings are consistent  with acute appendicitis (not pancreatitis). This is correctly stated in the impression portion of the report.   Electronically Signed   By: Jeannine Boga M.D.   On: 09/25/2013 03:25   09/25/2013   CLINICAL DATA:  ABDOMINAL PAIN concern for appendicitis, concern for appendicitis.  EXAM: CT ABDOMEN AND PELVIS WITH CONTRAST  TECHNIQUE: Multidetector CT imaging of the abdomen and pelvis was performed using the standard protocol following bolus administration of intravenous contrast.  CONTRAST:  191mL OMNIPAQUE IOHEXOL 300 MG/ML  SOLN  COMPARISON:  None.  FINDINGS: Mild subsegmental atelectasis present dependently within the visualized lung bases.  The liver demonstrates a normal contrast enhanced appearance. Gallbladder within normal limits. No biliary ductal dilatation. The spleen, adrenal glands, and pancreas demonstrate a normal contrast enhanced appearance.  Kidneys are equal size with symmetric enhancement. No nephrolithiasis, hydronephrosis, or focal enhancing renal mass.  Stomach within normal limits.  No evidence of bowel obstruction.  The appendix is well visualized in the right lower quadrant, and is markedly dilated up to 1.3 cm with mucosal enhancement and periappendiceal fat stranding. Findings are consistent with acute pancreatitis. No free air to suggest perforation. There is associated free fluid within the right pelvic cul-de-sac and right lower quadrant, likely reactive in nature. No periappendiceal abscess.  Bladder within normal limits.  Uterus and ovaries are unremarkable.  No pathologically enlarged intra-abdominal pelvic lymph nodes. Normal intravascular enhancement seen throughout the intra-abdominal aorta and its branch vessels.  No acute osseous abnormality. No worrisome lytic or blastic osseous lesions.  IMPRESSION: Findings consistent with acute appendicitis. No evidence of perforation.  Electronically Signed: By: Jeannine Boga M.D. On: 09/25/2013 03:09     Anti-infectives: Anti-infectives   Start     Dose/Rate Route Frequency Ordered Stop   09/25/13 0500  metroNIDAZOLE (FLAGYL) IVPB 500 mg     500 mg 100 mL/hr over 60 Minutes Intravenous Every 8 hours 09/25/13 0447     09/25/13 0500  cefTRIAXone (ROCEPHIN) 2 g in dextrose 5 % 50 mL IVPB     2 g 100 mL/hr over 30 Minutes Intravenous Every 24 hours 09/25/13 0447        Assessment/Plan: Acute appendicitis.   I recommended operative management along with IV antibiotics.  We discussed laparoscopic appendectomy. We discussed the risk and benefits of surgery including but not limited to bleeding, infection, injury to surrounding structures, need to convert to an open procedure, blood clot formation, post operative abscess or wound infection, staple line complications such as leak or bleeding, hernia formation, post operative ileus, need for additional procedures, anesthesia complications, and the typical postoperative course. I explained that the patient should expect a good improvement in their symptoms.  Donna Hawkins. [name], MD, FACS General, Bariatric, & Minimally Invasive Surgery [facility], [place]   LOS: 0 days    Gayland Curry 09/25/2013

## 2013-09-26 DIAGNOSIS — I059 Rheumatic mitral valve disease, unspecified: Secondary | ICD-10-CM | POA: Diagnosis present

## 2013-09-26 DIAGNOSIS — Z5331 Laparoscopic surgical procedure converted to open procedure: Secondary | ICD-10-CM | POA: Diagnosis not present

## 2013-09-26 DIAGNOSIS — R1310 Dysphagia, unspecified: Secondary | ICD-10-CM | POA: Diagnosis present

## 2013-09-26 DIAGNOSIS — K560 Paralytic ileus: Secondary | ICD-10-CM | POA: Diagnosis not present

## 2013-09-26 DIAGNOSIS — K3580 Unspecified acute appendicitis: Secondary | ICD-10-CM | POA: Diagnosis present

## 2013-09-26 DIAGNOSIS — R1031 Right lower quadrant pain: Secondary | ICD-10-CM | POA: Diagnosis present

## 2013-09-26 DIAGNOSIS — Z7982 Long term (current) use of aspirin: Secondary | ICD-10-CM | POA: Diagnosis not present

## 2013-09-26 DIAGNOSIS — Z9049 Acquired absence of other specified parts of digestive tract: Secondary | ICD-10-CM

## 2013-09-26 DIAGNOSIS — I519 Heart disease, unspecified: Secondary | ICD-10-CM | POA: Diagnosis not present

## 2013-09-26 DIAGNOSIS — R110 Nausea: Secondary | ICD-10-CM | POA: Diagnosis not present

## 2013-09-26 DIAGNOSIS — T40605A Adverse effect of unspecified narcotics, initial encounter: Secondary | ICD-10-CM | POA: Diagnosis not present

## 2013-09-26 LAB — BASIC METABOLIC PANEL
ANION GAP: 12 (ref 5–15)
BUN: 5 mg/dL — ABNORMAL LOW (ref 6–23)
CHLORIDE: 100 meq/L (ref 96–112)
CO2: 22 mEq/L (ref 19–32)
Calcium: 8.7 mg/dL (ref 8.4–10.5)
Creatinine, Ser: 0.54 mg/dL (ref 0.50–1.10)
Glucose, Bld: 186 mg/dL — ABNORMAL HIGH (ref 70–99)
Potassium: 4.2 mEq/L (ref 3.7–5.3)
SODIUM: 134 meq/L — AB (ref 137–147)

## 2013-09-26 LAB — CBC
HCT: 33.5 % — ABNORMAL LOW (ref 36.0–46.0)
HEMOGLOBIN: 11.4 g/dL — AB (ref 12.0–15.0)
MCH: 31.8 pg (ref 26.0–34.0)
MCHC: 34 g/dL (ref 30.0–36.0)
MCV: 93.6 fL (ref 78.0–100.0)
PLATELETS: 279 10*3/uL (ref 150–400)
RBC: 3.58 MIL/uL — AB (ref 3.87–5.11)
RDW: 12.6 % (ref 11.5–15.5)
WBC: 11.8 10*3/uL — AB (ref 4.0–10.5)

## 2013-09-26 LAB — MAGNESIUM: MAGNESIUM: 1.9 mg/dL (ref 1.5–2.5)

## 2013-09-26 LAB — PHOSPHORUS: PHOSPHORUS: 2.6 mg/dL (ref 2.3–4.6)

## 2013-09-26 LAB — TROPONIN I: Troponin I: <0.3 ng/mL (ref <0.30)

## 2013-09-26 MED ORDER — KCL IN DEXTROSE-NACL 20-5-0.45 MEQ/L-%-% IV SOLN
INTRAVENOUS | Status: DC
  Administered 2013-09-26 – 2013-09-29 (×6): via INTRAVENOUS
  Filled 2013-09-26 (×13): qty 1000

## 2013-09-26 MED ORDER — HYDROMORPHONE HCL PF 1 MG/ML IJ SOLN
0.5000 mg | INTRAMUSCULAR | Status: DC | PRN
  Administered 2013-09-26 (×3): 1 mg via INTRAVENOUS
  Filled 2013-09-26 (×3): qty 1

## 2013-09-26 MED ORDER — FENTANYL CITRATE 0.05 MG/ML IJ SOLN
12.5000 ug | INTRAMUSCULAR | Status: DC | PRN
  Administered 2013-09-26 – 2013-09-29 (×11): 25 ug via INTRAVENOUS
  Filled 2013-09-26 (×11): qty 2

## 2013-09-26 MED ORDER — METHOCARBAMOL 1000 MG/10ML IJ SOLN
1000.0000 mg | Freq: Three times a day (TID) | INTRAMUSCULAR | Status: DC | PRN
  Administered 2013-09-27: 1000 mg via INTRAVENOUS
  Filled 2013-09-26 (×2): qty 10

## 2013-09-26 NOTE — Progress Notes (Signed)
Pt seen and examined this am Gets nauseated each iv pain med given No IS since surgery  Alert, nad Soft, approp TTP  Change IV pain med - explained may have similar reaction to other narcotics Sips/chips Oob, IS PT/OT Cont iv abx for total 7 days tx to floor  Leighton Ruff. [name], MD, FACS General, Bariatric, & Minimally Invasive Surgery [facility], [place]

## 2013-09-26 NOTE — Addendum Note (Signed)
Addendum created 09/26/13 0820 by Terrill Mohr, CRNA   Modules edited: Anesthesia Medication Administration

## 2013-09-26 NOTE — Progress Notes (Signed)
Abdominal dressing changed with Coralie Keens, PA at bedside.

## 2013-09-26 NOTE — Progress Notes (Signed)
Patient has been OOB with 2 person assists.  Dilaudid was given prior to OOB.  Husband at bedside.  Pt tolerated getting out of bed.  Encouraged to use IS.

## 2013-09-26 NOTE — Progress Notes (Signed)
[facility] Progress Note  1 Day Post-Op  Subjective: Pt doing well.  Dr. [name] explained why her surgery was different than expected yesterday.  She denies any questions relating to that.  Surgical path pending.  No flatus or BM yet.  Not been OOB yet.  C/o pain, but morphine is making her very nauseated, thus she's having to get zofran at every dose.  Nurses note no cardiac events.  Troponins negative.  Objective: Vital signs in last 24 hours: Temp:  [97 F (36.1 C)-99.5 F (37.5 C)] 98.6 F (37 C) (08/04 0400) Pulse Rate:  [74-119] 86 (08/04 0600) Resp:  [14-32] 19 (08/04 0600) BP: (120-148)/(49-75) 142/69 mmHg (08/04 0600) SpO2:  [96 %-100 %] 99 % (08/04 0600) Last BM Date: 09/23/13  Intake/Output from previous day: 08/03 0701 - 08/04 0700 In: 5550 [I.V.:5050; IV Piggyback:500] Out: 1750 [Urine:1675; Blood:75] Intake/Output this shift:    PE: Gen:  Alert, NAD, pleasant Card:  RRR, no M/G/R heard Pulm:  CTA, no W/R/R Abd: Soft, mild distension, moderate tenderness, +BS, no HSM, incisions C/D/I, drain with minimal sanguinous drainage, no abdominal scars noted   Lab Results:   Recent Labs  09/24/13 2058 09/26/13 0350  WBC 15.8* 11.8*  HGB 13.6 11.4*  HCT 39.3 33.5*  PLT 332 279   BMET  Recent Labs  09/24/13 2058 09/26/13 0350  NA 138 134*  K 4.3 4.2  CL 100 100  CO2 23 22  GLUCOSE 115* 186*  BUN 11 5*  CREATININE 0.78 0.54  CALCIUM 10.0 8.7   PT/INR No results found for this basename: LABPROT, INR,  in the last 72 hours CMP     Component Value Date/Time   NA 134* 09/26/2013 0350   K 4.2 09/26/2013 0350   CL 100 09/26/2013 0350   CO2 22 09/26/2013 0350   GLUCOSE 186* 09/26/2013 0350   BUN 5* 09/26/2013 0350   CREATININE 0.54 09/26/2013 0350   CALCIUM 8.7 09/26/2013 0350   PROT 7.8 09/24/2013 2058   ALBUMIN 4.2 09/24/2013 2058   AST 18 09/24/2013 2058   ALT 15 09/24/2013 2058   ALKPHOS 102 09/24/2013 2058   BILITOT 0.7 09/24/2013 2058   GFRNONAA >90  09/26/2013 0350   GFRAA >90 09/26/2013 0350   Lipase     Component Value Date/Time   LIPASE 30 09/24/2013 2058       Studies/Results: Ct Abdomen Pelvis W Contrast  09/25/2013   ADDENDUM REPORT: 09/25/2013 03:51  ADDENDUM: In addition to the above findings for acute appendicitis, a possible underlying mucinous neoplasm of the appendix could be considered given the relative marked appendiceal dilatation and hypodense fluid signal intensity seen throughout the appendiceal lumen. No evidence of metastatic mucinous neoplasm identified in the abdomen (pseudomyxoma peritonei or other peritoneal metastasis).  These results were called by telephone at the time of interpretation on 09/25/2013 at 3:40 am to Dr. Linton Flemings , who verbally acknowledged these results. Findings were also communicated to Dr. Grandville Silos of the surgical service at 3:49 a.m. on 09/25/2013.   Electronically Signed   By: Jeannine Boga M.D.   On: 09/25/2013 03:51   09/25/2013   ADDENDUM REPORT: 09/25/2013 03:25  ADDENDUM: There is a dictation error in the body portion of the report. The body should stage findings are consistent with acute appendicitis (not pancreatitis). This is correctly stated in the impression portion of the report.   Electronically Signed   By: Jeannine Boga M.D.   On: 09/25/2013 03:25  09/25/2013   CLINICAL DATA:  ABDOMINAL PAIN concern for appendicitis, concern for appendicitis.  EXAM: CT ABDOMEN AND PELVIS WITH CONTRAST  TECHNIQUE: Multidetector CT imaging of the abdomen and pelvis was performed using the standard protocol following bolus administration of intravenous contrast.  CONTRAST:  145mL OMNIPAQUE IOHEXOL 300 MG/ML  SOLN  COMPARISON:  None.  FINDINGS: Mild subsegmental atelectasis present dependently within the visualized lung bases.  The liver demonstrates a normal contrast enhanced appearance. Gallbladder within normal limits. No biliary ductal dilatation. The spleen, adrenal glands, and pancreas  demonstrate a normal contrast enhanced appearance.  Kidneys are equal size with symmetric enhancement. No nephrolithiasis, hydronephrosis, or focal enhancing renal mass.  Stomach within normal limits.  No evidence of bowel obstruction.  The appendix is well visualized in the right lower quadrant, and is markedly dilated up to 1.3 cm with mucosal enhancement and periappendiceal fat stranding. Findings are consistent with acute pancreatitis. No free air to suggest perforation. There is associated free fluid within the right pelvic cul-de-sac and right lower quadrant, likely reactive in nature. No periappendiceal abscess.  Bladder within normal limits.  Uterus and ovaries are unremarkable.  No pathologically enlarged intra-abdominal pelvic lymph nodes. Normal intravascular enhancement seen throughout the intra-abdominal aorta and its branch vessels.  No acute osseous abnormality. No worrisome lytic or blastic osseous lesions.  IMPRESSION: Findings consistent with acute appendicitis. No evidence of perforation.  Electronically Signed: By: Jeannine Boga M.D. On: 09/25/2013 03:09    Anti-infectives: Anti-infectives   Start     Dose/Rate Route Frequency Ordered Stop   09/25/13 0500  metroNIDAZOLE (FLAGYL) IVPB 500 mg     500 mg 100 mL/hr over 60 Minutes Intravenous Every 8 hours 09/25/13 0447     09/25/13 0500  cefTRIAXone (ROCEPHIN) 2 g in dextrose 5 % 50 mL IVPB     2 g 100 mL/hr over 30 Minutes Intravenous Every 24 hours 09/25/13 0447         Assessment/Plan Acute gangrenous appendicitis POD #1 s/p lap appy converted to lap assisted right colectomy/appendectomy Leukocytosis - 11.8 Asystole during surgery - likely vagal, EKG okay, troponins negative CAD  Plan: 1.  NPO, IVF, pain control, antiemetics, antibiotics (Rocephin and Flagyl Day #2/7) 2.  Await bowel function prior to advancing diet 3.  Ambulate and IS 4.  SCD's and lovenox 5.  Can likely d/c tele tomorrow if no events after  transferred to the floor today 6.  D/c foley 7.  Dressing changed BID    LOS: 1 day    Coralie Keens 09/26/2013, 7:48 AM Pager: [phone]

## 2013-09-27 MED ORDER — ACETAMINOPHEN 325 MG PO TABS
650.0000 mg | ORAL_TABLET | Freq: Four times a day (QID) | ORAL | Status: DC | PRN
  Administered 2013-09-29: 650 mg via ORAL
  Filled 2013-09-27: qty 2

## 2013-09-27 MED ORDER — KETOROLAC TROMETHAMINE 15 MG/ML IJ SOLN: 15.0000 mg | Freq: Four times a day (QID) | INTRAMUSCULAR | Status: DC

## 2013-09-27 NOTE — Progress Notes (Signed)
Occupational Therapy Evaluation Patient Details Name: Donna Hawkins MRN: [id number] DOB: Jul 09, 1955 Today's Date: 09/27/2013    History of Present Illness Donna Hawkins is a 58 y.o. Female admitted 09/25/13 with lower R quadrant abdominal pain. Pt is s/p laparoscopic appendectomy converted to laparoscopic-assisted right colectomy on 8/4.    Clinical Impression   PTA pt lived at home with spouse and was independent with ADLs and functional mobility. Pt declined OOB activities and per PT note ambulated 300 ft min guard with IV pole and requires min (A) for sit<>stand. Educated pt on fall prevention, energy conservation, and surgical site protection through environmental modifications. Pt reports she will have assistance at home and has no ADL concerns. Acute OT to sign off at this time.     Follow Up Recommendations  No OT follow up;Supervision/Assistance - 24 hour    Equipment Recommendations  Tub/shower seat       Precautions / Restrictions Restrictions Weight Bearing Restrictions: No      Mobility Bed Mobility Overal bed mobility: Needs Assistance Bed Mobility: Rolling;Sidelying to Sit Rolling: Min guard Sidelying to sit: Min assist       General bed mobility comments: Pt in bed for duration.  Transfers Overall transfer level: Needs assistance   Transfers: Sit to/from Stand Sit to Stand: Min assist         General transfer comment: Pt declined OOB activities. Per PT note, pt requires min (A) for sit<>stand.          ADL Overall ADL's : Needs assistance/impaired Eating/Feeding: Independent;Sitting   Grooming: Sitting;Set up   Upper Body Bathing: Set up;Sitting       Upper Body Dressing : Set up;Sitting                     General ADL Comments: Pt declined OOB activities as she just completed wound dressing change and is fatigued. Educated pt on minimizing trunk movements to protect wound, including compensatory techniques for UB dressing. Educated pt on  fall prevention and sitting for bathing and dressing, including crossing legs in figure four to don/doff socks and shoes. Pt reports she will have assistance from family. Educated pt on energy conservation and environmental modification for increased independence at home.      Vision  Pt reports no change from baseline. No apparent visual deficits.                    Perception Perception Perception Tested?: No   Praxis Praxis Praxis tested?: Within functional limits    Pertinent Vitals/Pain Pt reports pain in surgical site from wound dressing change. RN is aware. Pt declined repositioning. Limited evaluation due to pain level.      Hand Dominance Right   Extremity/Trunk Assessment Upper Extremity Assessment Upper Extremity Assessment: Overall WFL for tasks assessed   Lower Extremity Assessment Lower Extremity Assessment: Overall WFL for tasks assessed;Defer to PT evaluation   Cervical / Trunk Assessment Cervical / Trunk Assessment:  (flexed posture due to pain)   Communication Communication Communication: No difficulties   Cognition Arousal/Alertness: Awake/alert Behavior During Therapy: WFL for tasks assessed/performed Overall Cognitive Status: Within Functional Limits for tasks assessed                                Home Living Family/patient expects to be discharged to:: Private residence Living Arrangements: Spouse/significant other Available Help at Discharge: Family Type of Home: [place]  Access: Stairs to enter [employer] of Steps: several Entrance Stairs-Rails: Right;Left Home Layout: One level     Bathroom Shower/Tub: Tub/shower unit Shower/tub characteristics: Architectural technologist: Standard     Home Equipment: None   Additional Comments: Pt/husband live in [place]; were here and dtr and Son in [place] for the w/e when pain started.  They will work on plan for pt assist post d/c once they have more infor.  Husband works  days.      Prior Functioning/Environment Level of Independence: Independent                                       End of Session  Activity Tolerance: Patient limited by fatigue;Patient limited by pain Patient left: in bed;with call bell/phone within reach;with family/visitor present   Time: 8527-7824 OT Time Calculation (min): 11 min Charges:  OT General Charges $OT Visit: 1 Procedure OT Evaluation $Initial OT Evaluation Tier I: 1 Procedure  Juluis Rainier [phone] 09/27/2013, 5:08 PM

## 2013-09-27 NOTE — Evaluation (Signed)
Physical Therapy Evaluation Patient Details Name: Donna Hawkins MRN: [id number] DOB: January 29, 1956 Today's Date: 09/27/2013   History of Present Illness  Admitted with Lower R quadrant abdominal pain, s/p lap appy converted to open colectomy.  Clinical Impression  Pt admitted with/for abd. Pain s/p colectomy.  Pt currently limited functionally due to the problems listed below.  (see problems list.)  Pt will benefit from PT to maximize function and safety to be able to get home safely with available assist of family.     Follow Up Recommendations No PT follow up    Equipment Recommendations  Other (comment) (TBA, like will have no needs)    Recommendations for Other Services       Precautions / Restrictions        Mobility  Bed Mobility Overal bed mobility: Needs Assistance Bed Mobility: Rolling;Sidelying to Sit Rolling: Min guard Sidelying to sit: Min assist       General bed mobility comments: cues for technique; assist due to pain  Transfers Overall transfer level: Needs assistance   Transfers: Sit to/from Stand Sit to Stand: Min assist         General transfer comment: cues for hand placement and assist needs based on height of surface.  Ambulation/Gait Ambulation/Gait assistance: Min guard Ambulation Distance (Feet): 300 Feet Assistive device:  (pushing iv pole) Gait Pattern/deviations: Step-through pattern   Gait velocity interpretation: at or above normal speed for age/gender General Gait Details: steady, but guarded  Stairs            Wheelchair Mobility    Modified Rankin (Stroke Patients Only)       Balance Overall balance assessment: No apparent balance deficits (not formally assessed)                                           Pertinent Vitals/Pain Incisional pain. Asked for [name] expects to be discharged to:: Private residence Living Arrangements: Spouse/significant other Available  Help at Discharge: Family Type of Home: House Home Access: Stairs to enter Entrance Stairs-Rails: Psychiatric nurse of Steps: several Home Layout: One level Farmer: None Additional Comments: Pt/husband live in [place]; were here and dtr and Son in [place] for the w/e when pain started.  They will work on plan for pt assist post d/c once they have more infor.  Husband works days.    Prior Function Level of Independence: Independent               Hand Dominance        Extremity/Trunk Assessment               Lower Extremity Assessment: Overall WFL for tasks assessed      Cervical / Trunk Assessment:  (flexed posture due to pain)  Communication   Communication: No difficulties  Cognition Arousal/Alertness: Awake/alert Behavior During Therapy: WFL for tasks assessed/performed Overall Cognitive Status: Within Functional Limits for tasks assessed                      General Comments      Exercises        Assessment/Plan    PT Assessment Patient needs continued PT services  PT Diagnosis Acute pain   PT Problem List Decreased mobility;Decreased knowledge of precautions;Pain;Decreased activity tolerance  PT Treatment Interventions Gait training;Stair training;Functional mobility training;Therapeutic activities;Patient/family  education   PT Goals (Current goals can be found in the Care Plan section) Acute Rehab PT Goals Patient Stated Goal: Back independent and able to help out with my grand baby to be. PT Goal Formulation: With patient Time For Goal Achievement: 10/04/13 Potential to Achieve Goals: Good    Frequency Min 3X/week   Barriers to discharge        Co-evaluation               End of Session   Activity Tolerance: Patient tolerated treatment well Patient left: in chair;with call bell/phone within reach;with family/visitor present Nurse Communication: Mobility status         Time: 8127-5170 PT Time  Calculation (min): 23 min   Charges:   PT Evaluation $Initial PT Evaluation Tier I: 1 Procedure PT Treatments $Gait Training: 8-22 mins   PT G Codes:          Amena Dockham, Tessie Fass 09/27/2013, 2:06 PM 09/27/2013  Donnella Sham, PT [phone]  (pager)

## 2013-09-27 NOTE — Progress Notes (Signed)
2 Days Post-Op  Subjective: Feels nausea. abd wall pain. No flatus.   Objective: Vital signs in last 24 hours: Temp:  [98 F (36.7 C)-98.3 F (36.8 C)] 98 F (36.7 C) (08/05 0522) Pulse Rate:  [84-88] 84 (08/05 0522) Resp:  [15-20] 20 (08/05 0522) BP: (113-138)/(56-72) 127/62 mmHg (08/05 0522) SpO2:  [96 %-97 %] 96 % (08/05 0522) Last BM Date: 09/23/13  Intake/Output from previous day: 08/04 0701 - 08/05 0700 In: 970 [I.V.:970] Out: 400 [Urine:400] Intake/Output this shift:    Alert, sitting in chair cta  Reg Soft, mild distension, hypobs. Dressing intact.  No edema  Lab Results:   Recent Labs  09/24/13 2058 09/26/13 0350  WBC 15.8* 11.8*  HGB 13.6 11.4*  HCT 39.3 33.5*  PLT 332 279   BMET  Recent Labs  09/24/13 2058 09/26/13 0350  NA 138 134*  K 4.3 4.2  CL 100 100  CO2 23 22  GLUCOSE 115* 186*  BUN 11 5*  CREATININE 0.78 0.54  CALCIUM 10.0 8.7   PT/INR No results found for this basename: LABPROT, INR,  in the last 72 hours ABG No results found for this basename: PHART, PCO2, PO2, HCO3,  in the last 72 hours  Studies/Results: No results found.  Anti-infectives: Anti-infectives   Start     Dose/Rate Route Frequency Ordered Stop   09/25/13 0500  metroNIDAZOLE (FLAGYL) IVPB 500 mg     500 mg 100 mL/hr over 60 Minutes Intravenous Every 8 hours 09/25/13 0447     09/25/13 0500  cefTRIAXone (ROCEPHIN) 2 g in dextrose 5 % 50 mL IVPB     2 g 100 mL/hr over 30 Minutes Intravenous Every 24 hours 09/25/13 0447        Assessment/Plan: s/p Procedure(s):  LAPAROSCOPIC APPENDECTOMY CONVERTED TO LAPAROSCOPIC ASSISTED RIGHT COLECTOMY (N/A)  Oob, is pulm toilet Will allow sips of clears.  vte prophylaxis Discussed path Pain control  Cont IV abx due to ruptured appx  Donna Hawkins, Donna Hawkins, Donna Hawkins, Donna Hawkins, & Donna Hawkins   Donna Hawkins: 2 days    Donna Hawkins 09/27/2013

## 2013-09-28 ENCOUNTER — Inpatient Hospital Stay (HOSPITAL_COMMUNITY): Payer: BC Managed Care – PPO

## 2013-09-28 ENCOUNTER — Encounter (HOSPITAL_COMMUNITY): Payer: Self-pay | Admitting: General Surgery

## 2013-09-28 MED ORDER — PROCHLORPERAZINE EDISYLATE 5 MG/ML IJ SOLN
10.0000 mg | Freq: Four times a day (QID) | INTRAMUSCULAR | Status: DC | PRN
  Administered 2013-09-28 (×2): 10 mg via INTRAVENOUS
  Filled 2013-09-28 (×3): qty 2

## 2013-09-28 MED ORDER — METHOCARBAMOL 1000 MG/10ML IJ SOLN
500.0000 mg | Freq: Three times a day (TID) | INTRAVENOUS | Status: DC
  Administered 2013-09-28 – 2013-10-01 (×8): 500 mg via INTRAVENOUS
  Filled 2013-09-28 (×12): qty 5

## 2013-09-28 MED ORDER — PANTOPRAZOLE SODIUM 40 MG IV SOLR
40.0000 mg | Freq: Two times a day (BID) | INTRAVENOUS | Status: DC
  Administered 2013-09-28 – 2013-09-30 (×5): 40 mg via INTRAVENOUS
  Filled 2013-09-28 (×4): qty 40

## 2013-09-28 MED ORDER — METHOCARBAMOL 1000 MG/10ML IJ SOLN
500.0000 mg | Freq: Three times a day (TID) | INTRAMUSCULAR | Status: DC
  Filled 2013-09-28 (×3): qty 5

## 2013-09-28 NOTE — Progress Notes (Signed)
Pt seen and examined late pm Had n/v around lunchtime. Still with some nausea. No flatus. A little sleepy with recent nausea med  Sitting in chair cta Some bloating, soft, expected TTP. Incision wound ok  Xray shows findings c/w ileus - dilated loops of SB  Discussed ileus with pt and husband. Vitals stabe. No fever/tachy Will repeat labs in am to r/o electrolyte issues Await return of bowel function, if persistent vomiting - NG tube  Leighton Ruff. [name], MD, FACS General, Bariatric, & Minimally Invasive Surgery [facility], [place]

## 2013-09-28 NOTE — Progress Notes (Signed)
Pt had been ambulating in hall 4x now with standby assist and no assistive device and tolerating well.  Noted edema at bilateral feet with right foot +2 and left foot +1.  Elevated both feet while on bed and recliner. New PRN medication Compazine for nausea was effective, patient able to sleep well after administration and managed the nausea.Marland Kitchen

## 2013-09-28 NOTE — Progress Notes (Signed)
Patient ID: Donna Hawkins, female   DOB: October 29, 1955, 58 y.o.   MRN: [id number] 3 Days Post-Op  Subjective: Patient states her nausea is even worse today. She feels like she has horrible reflux and that she gets her up at any point in time.  Objective: Vital signs in last 24 hours: Temp:  [98.5 F (36.9 C)-99.7 F (37.6 C)] 99.1 F (37.3 C) (08/06 1004) Pulse Rate:  [84-98] 84 (08/06 1004) Resp:  [16-18] 18 (08/06 1004) BP: (140-150)/(72-86) 144/73 mmHg (08/06 1004) SpO2:  [96 %-98 %] 96 % (08/06 1004) Last BM Date: 09/23/13  Intake/Output from previous day: 08/05 0701 - 08/06 0700 In: -  Out: 800 [Urine:800] Intake/Output this shift:    PE: Abd:  Soft, minimally tender, a few bowel sounds. Her incision is open and packed. Her wound is clean. Lab Results:   Recent Labs  09/26/13 0350  WBC 11.8*  HGB 11.4*  HCT 33.5*  PLT 279   BMET  Recent Labs  09/26/13 0350  NA 134*  K 4.2  CL 100  CO2 22  GLUCOSE 186*  BUN 5*  CREATININE 0.54  CALCIUM 8.7   PT/INR No results found for this basename: LABPROT, INR,  in the last 72 hours CMP     Component Value Date/Time   NA 134* 09/26/2013 0350   K 4.2 09/26/2013 0350   CL 100 09/26/2013 0350   CO2 22 09/26/2013 0350   GLUCOSE 186* 09/26/2013 0350   BUN 5* 09/26/2013 0350   CREATININE 0.54 09/26/2013 0350   CALCIUM 8.7 09/26/2013 0350   PROT 7.8 09/24/2013 2058   ALBUMIN 4.2 09/24/2013 2058   AST 18 09/24/2013 2058   ALT 15 09/24/2013 2058   ALKPHOS 102 09/24/2013 2058   BILITOT 0.7 09/24/2013 2058   GFRNONAA >90 09/26/2013 0350   GFRAA >90 09/26/2013 0350   Lipase     Component Value Date/Time   LIPASE 30 09/24/2013 2058       Studies/Results: No results found.  Anti-infectives: Anti-infectives   Start     Dose/Rate Route Frequency Ordered Stop   09/25/13 0500  metroNIDAZOLE (FLAGYL) IVPB 500 mg     500 mg 100 mL/hr over 60 Minutes Intravenous Every 8 hours 09/25/13 0447     09/25/13 0500  cefTRIAXone (ROCEPHIN) 2 g in dextrose 5  % 50 mL IVPB     2 g 100 mL/hr over 30 Minutes Intravenous Every 24 hours 09/25/13 0447         Assessment/Plan  Acute gangrenous appendicitis POD #3 s/p lap appy converted to lap assisted right colectomy/appendectomy  Leukocytosis - 11.8  Asystole during surgery - likely vagal, EKG okay, troponins negative  CAD  Plan: 1. Given continual nausea, I ordered a plain film today. This is fairly unremarkable. I suspect the patient is having continued nausea secondary to narcotic pain medicine. I have added Compazine today. The patient has a family history of seizures with Phenergan. Therefore Phenergan should not be tried. 2. Continue ice chips and sips for now. I will discuss this patient with Dr. [name] further to try and help determine a better plan to get rid of her nausea so we can start advancing her diet    LOS: 3 days    Donna Hawkins E 09/28/2013, 11:06 AM Pager: [phone]

## 2013-09-29 LAB — BASIC METABOLIC PANEL
Anion gap: 11 (ref 5–15)
BUN: 10 mg/dL (ref 6–23)
CO2: 24 meq/L (ref 19–32)
Calcium: 8.3 mg/dL — ABNORMAL LOW (ref 8.4–10.5)
Chloride: 108 mEq/L (ref 96–112)
Creatinine, Ser: 0.57 mg/dL (ref 0.50–1.10)
GFR calc Af Amer: >90 mL/min (ref >90)
GFR calc non Af Amer: >90 mL/min (ref >90)
Glucose, Bld: 124 mg/dL — ABNORMAL HIGH (ref 70–99)
POTASSIUM: 3.6 meq/L — AB (ref 3.7–5.3)
SODIUM: 143 meq/L (ref 137–147)

## 2013-09-29 LAB — CBC
HCT: 33.3 % — ABNORMAL LOW (ref 36.0–46.0)
HEMOGLOBIN: 11.2 g/dL — AB (ref 12.0–15.0)
MCH: 31 pg (ref 26.0–34.0)
MCHC: 33.6 g/dL (ref 30.0–36.0)
MCV: 92.2 fL (ref 78.0–100.0)
Platelets: 395 10*3/uL (ref 150–400)
RBC: 3.61 MIL/uL — AB (ref 3.87–5.11)
RDW: 12.7 % (ref 11.5–15.5)
WBC: 10.1 10*3/uL (ref 4.0–10.5)

## 2013-09-29 NOTE — Progress Notes (Signed)
Patient alert and oriented, up to chair most of shift, only tylenol given for pain x1, ambulated hallway with husband and pt x5, patient states she feels great, tolerated 3 clear liquid diet meals, wanted to advance diet, spoke to PA encouraged to slow the advancement of diet, probably advance in am, patient passing gas, dressing changed, had an 1inch of stool, plan to discharge with home health

## 2013-09-29 NOTE — Progress Notes (Signed)
4 Days Post-Op  Subjective: Reports flatus and less nausea  Objective: Vital signs in last 24 hours: Temp:  [98.6 F (37 C)-99.3 F (37.4 C)] 98.6 F (37 C) (08/07 0549) Pulse Rate:  [82-88] 86 (08/07 0549) Resp:  [15-18] 16 (08/07 0549) BP: (135-146)/(71-77) 135/74 mmHg (08/07 0549) SpO2:  [96 %-98 %] 97 % (08/07 0549) Last BM Date: 09/25/13  Intake/Output from previous day: Oct 05, 2022 0701 - 08/07 0700 In: 6224.3 [I.V.:5469.3; IV Piggyback:755] Out: -  Intake/Output this shift:    Alert, nad, looks better cta  Reg Soft, nd, open wound. +BS  Lab Results:   Recent Labs  09/29/13 0015  WBC 10.1  HGB 11.2*  HCT 33.3*  PLT 395   BMET  Recent Labs  09/29/13 0015  NA 143  K 3.6*  CL 108  CO2 24  GLUCOSE 124*  BUN 10  CREATININE 0.57  CALCIUM 8.3*   PT/INR No results found for this basename: LABPROT, INR,  in the last 72 hours ABG No results found for this basename: PHART, PCO2, PO2, HCO3,  in the last 72 hours  Studies/Results: Dg Abd 2 Views  October 04, 2013   CLINICAL DATA:  58 year old female with nausea status post laparoscopic assisted right colectomy. Initial encounter.  EXAM: ABDOMEN - 2 VIEW  COMPARISON:  Preoperative CT Abdomen and Pelvis 09/25/2013.  FINDINGS: Small right greater than left pleural effusions are now visible on the upright view. No pneumoperitoneum I do have side. Otherwise negative lung bases.  Gas-filled small bowel loops measure up to 43 mm diameter, with air-fluid levels on the upright view. The oral contrast on the comparison has reached the distal colon. No colonic dilatation. No acute osseous abnormality identified.  IMPRESSION: 1. Dilated small bowel loops which could reflect postoperative ileus or partial small bowel obstruction. Oral contrast from the CT Abdomen and Pelvis on 09/25/2013 has reached the distal colon. 2. No pneumoperitoneum. 3. New small pleural effusions.   Electronically Signed   By: Lars Pinks M.D.   On: 2013/10/04 11:06     Anti-infectives: Anti-infectives   Start     Dose/Rate Route Frequency Ordered Stop   09/25/13 0500  metroNIDAZOLE (FLAGYL) IVPB 500 mg     500 mg 100 mL/hr over 60 Minutes Intravenous Every 8 hours 09/25/13 0447     09/25/13 0500  cefTRIAXone (ROCEPHIN) 2 g in dextrose 5 % 50 mL IVPB     2 g 100 mL/hr over 30 Minutes Intravenous Every 24 hours 09/25/13 0447        Assessment/Plan: s/p Procedure(s):  LAPAROSCOPIC APPENDECTOMY CONVERTED TO LAPAROSCOPIC ASSISTED RIGHT COLECTOMY (N/A)  Clear liquid diet Ambulate VTE prophylaxis IV abx for total 7 days  Leighton Ruff. [name], MD, FACS General, Bariatric, & Minimally Invasive Surgery [facility], [place]   LOS: 4 days    Gayland Curry 09/29/2013

## 2013-09-29 NOTE — Progress Notes (Signed)
Physical Therapy Treatment Patient Details Name: Donna Hawkins MRN: [id number] DOB: 10-20-1955 Today's Date: 09/29/2013    History of Present Illness Donna Hawkins is a 58 y.o. Female admitted 09/25/13 with lower R quadrant abdominal pain. Pt is s/p laparoscopic appendectomy converted to laparoscopic-assisted right colectomy on 8/4.     PT Comments    Pt and husband have got all mobility under control.  Pt's pain managed well and she has moved her bowels.  Will d/c at this time from PT.  Follow Up Recommendations  No PT follow up     Equipment Recommendations  Other (comment)    Recommendations for Other Services       Precautions / Restrictions      Mobility  Bed Mobility               General bed mobility comments: pt reports getting oob well enough with minimal assist  Transfers Overall transfer level: Needs assistance   Transfers: Sit to/from Stand Sit to Stand: Supervision         General transfer comment: safe technique  Ambulation/Gait Ambulation/Gait assistance: Supervision Ambulation Distance (Feet): 250 Feet Assistive device: None Gait Pattern/deviations: Step-through pattern   Gait velocity interpretation: at or above normal speed for age/gender General Gait Details: steady with mildly flexed posture   Stairs Stairs: Yes   Stair Management: One rail Right;Alternating pattern;Forwards Number of Stairs: 4 General stair comments: steady with rail  Wheelchair Mobility    Modified Rankin (Stroke Patients Only)       Balance Overall balance assessment: No apparent balance deficits (not formally assessed)                                  Cognition Arousal/Alertness: Awake/alert Behavior During Therapy: WFL for tasks assessed/performed Overall Cognitive Status: Within Functional Limits for tasks assessed                      Exercises      General Comments        Pertinent Vitals/Pain      Home Living                      Prior Function            PT Goals (current goals can now be found in the care plan section) Acute Rehab PT Goals Patient Stated Goal: Back independent and able to help out with my grand baby to be. PT Goal Formulation: With patient Time For Goal Achievement: 10/04/13 Potential to Achieve Goals: Good Progress towards PT goals: Progressing toward goals    Frequency  Min 3X/week    PT Plan      Co-evaluation             End of Session   Activity Tolerance: Patient tolerated treatment well Patient left: in chair;with call bell/phone within reach;with family/visitor present     Time: 3382-5053 PT Time Calculation (min): 15 min  Charges:  $Gait Training: 8-22 mins                    G Codes:      Renesme Kerrigan, Tessie Fass 09/29/2013, 3:49 PM 09/29/2013  Donnella Sham, PT [phone]  (pager)

## 2013-09-30 MED ORDER — AMOXICILLIN-POT CLAVULANATE 400-57 MG/5ML PO SUSR
400.0000 mg | Freq: Two times a day (BID) | ORAL | Status: DC
  Filled 2013-09-30 (×2): qty 5

## 2013-09-30 MED ORDER — AMOXICILLIN-POT CLAVULANATE 250-62.5 MG/5ML PO SUSR
500.0000 mg | Freq: Two times a day (BID) | ORAL | Status: DC
  Filled 2013-09-30 (×3): qty 10

## 2013-09-30 MED ORDER — AMOXICILLIN-POT CLAVULANATE 400-57 MG/5ML PO SUSR
500.0000 mg | Freq: Two times a day (BID) | ORAL | Status: DC
  Administered 2013-09-30 – 2013-10-01 (×3): 500 mg via ORAL
  Filled 2013-09-30 (×6): qty 6.3

## 2013-09-30 MED ORDER — PANTOPRAZOLE SODIUM 40 MG PO TBEC
40.0000 mg | DELAYED_RELEASE_TABLET | Freq: Two times a day (BID) | ORAL | Status: DC
  Filled 2013-09-30: qty 1

## 2013-09-30 NOTE — Care Management Note (Signed)
    Page 1 of 1   09/30/2013     12:14:41 PM CARE MANAGEMENT NOTE 09/30/2013  Patient:  Donna Hawkins, Donna Hawkins   Account Number:  [id number]  Date Initiated:  09/28/2013  Documentation initiated by:  Sandi Mariscal  Subjective/Objective Assessment:   Acute gangrenous appendicitis POD #3 s/p lap appy converted to lap assisted right colectomy/appendectomy     Action/Plan:   home when stable postop   Anticipated DC Date:  09/30/2013   Anticipated DC Plan:  [place]  CM consult      Choice offered to / List presented to:          [facility] arranged  [place] - 11 Patient Refused      Status of service:  Completed, signed off Medicare Important Message given?  NA - LOS <3 / Initial given by admissions (If response is "NO", the following Medicare IM given date fields will be blank) Date Medicare IM given:   Medicare IM given by:   Date Additional Medicare IM given:   Additional Medicare IM given by:    Discharge Disposition:  HOME/SELF CARE  Per UR Regulation:  Reviewed for med. necessity/level of care/duration of stay  If discussed at [place] of Stay Meetings, dates discussed:    Comments:  09/30/13 10:00 CM met with family in room to offer choice for HH/RN for dressing changes.  Both husband and pt refuse HHRN as they feel they can handle the BID dressing changes. CM spoke to the RN who states shw will teach husband the proper dressing change tehnique.  No other CM needs were communicated.  Mariane Masters, BSN, CM [phone].

## 2013-09-30 NOTE — Progress Notes (Signed)
5 Days Post-Op  Subjective: PT TOLERATING DIET HAVING BM  FEELS WELL  Objective: Vital signs in last 24 hours: Temp:  [98.1 F (36.7 C)-98.6 F (37 C)] 98.1 F (36.7 C) (08/08 0520) Pulse Rate:  [72-96] 72 (08/08 0520) Resp:  [16-19] 17 (08/08 0520) BP: (143-166)/(76-85) 153/76 mmHg (08/08 0520) SpO2:  [94 %-99 %] 94 % (08/08 0520) Last BM Date: 09/29/13  Intake/Output from previous day: 08/07 0701 - 08/08 0700 In: 525 [I.V.:525] Out: -  Intake/Output this shift:    Extremities: edema 1 PLUS BILATERAL Incision/Wound:OPEN AND CLEAN SOFT MIN TENDERNESS  Lab Results:   Recent Labs  09/29/13 0015  WBC 10.1  HGB 11.2*  HCT 33.3*  PLT 395   BMET  Recent Labs  09/29/13 0015  NA 143  K 3.6*  CL 108  CO2 24  GLUCOSE 124*  BUN 10  CREATININE 0.57  CALCIUM 8.3*   PT/INR No results found for this basename: LABPROT, INR,  in the last 72 hours ABG No results found for this basename: PHART, PCO2, PO2, HCO3,  in the last 72 hours  Studies/Results: Dg Abd 2 Views  2013-10-10   CLINICAL DATA:  58 year old female with nausea status post laparoscopic assisted right colectomy. Initial encounter.  EXAM: ABDOMEN - 2 VIEW  COMPARISON:  Preoperative CT Abdomen and Pelvis 09/25/2013.  FINDINGS: Small right greater than left pleural effusions are now visible on the upright view. No pneumoperitoneum I do have side. Otherwise negative lung bases.  Gas-filled small bowel loops measure up to 43 mm diameter, with air-fluid levels on the upright view. The oral contrast on the comparison has reached the distal colon. No colonic dilatation. No acute osseous abnormality identified.  IMPRESSION: 1. Dilated small bowel loops which could reflect postoperative ileus or partial small bowel obstruction. Oral contrast from the CT Abdomen and Pelvis on 09/25/2013 has reached the distal colon. 2. No pneumoperitoneum. 3. New small pleural effusions.   Electronically Signed   By: Lars Pinks M.D.   On:  10/10/2013 11:06    Anti-infectives: Anti-infectives   Start     Dose/Rate Route Frequency Ordered Stop   09/30/13 1000  amoxicillin-clavulanate (AUGMENTIN) 400-57 MG/5ML suspension 400 mg     400 mg Oral Every 12 hours 09/30/13 0856     09/25/13 0500  metroNIDAZOLE (FLAGYL) IVPB 500 mg     500 mg 100 mL/hr over 60 Minutes Intravenous Every 8 hours 09/25/13 0447     09/25/13 0500  cefTRIAXone (ROCEPHIN) 2 g in dextrose 5 % 50 mL IVPB  Status:  Discontinued     2 g 100 mL/hr over 30 Minutes Intravenous Every 24 hours 09/25/13 0447 09/30/13 0856      Assessment/Plan: s/p Procedure(s):  LAPAROSCOPIC APPENDECTOMY CONVERTED TO LAPAROSCOPIC ASSISTED RIGHT COLECTOMY (N/A) ADV DIET CHANGE TO PO ABX SL IV HOME SUNDAY  LOS: 5 days    Donna Hawkins A. 09/30/2013

## 2013-09-30 NOTE — Progress Notes (Signed)
Pt's husband did an excellent job of changing the dressing with my supervision.  Supplies given for home use.

## 2013-10-01 MED ORDER — PANTOPRAZOLE SODIUM 40 MG PO PACK
40.0000 mg | PACK | Freq: Two times a day (BID) | ORAL | Status: DC
  Administered 2013-10-01: 40 mg via ORAL
  Filled 2013-10-01 (×2): qty 20

## 2013-10-01 MED ORDER — AMOXICILLIN-POT CLAVULANATE 400-57 MG/5ML PO SUSR: 500.0000 mg | Freq: Two times a day (BID) | ORAL | Status: DC

## 2013-10-01 MED ORDER — HYDROCODONE-ACETAMINOPHEN 5-325 MG PO TABS: 1.0000 | ORAL_TABLET | Freq: Four times a day (QID) | ORAL | Status: DC | PRN

## 2013-10-01 NOTE — Progress Notes (Signed)
Pt's husband has changed his wifes dressing twice with supervision and did excellent.

## 2013-10-01 NOTE — Discharge Summary (Signed)
Physician Discharge Summary  Patient ID: Donna Hawkins MRN: [id number] DOB/AGE: [birth year] 58 y.o.  Admit date: 09/25/2013 Discharge date: 10/01/2013  Admission Diagnoses:APPENDICITIS  Discharge Diagnoses:  Active Problems:   Appendicitis   S/P right colectomy   Discharged Condition: good  Hospital Course: PT ADMITTED WITH ACUTE APPENDICITIS AND TAKEN BY DR [name] TO OR FOR LAPAROSCOPIC CONVERTED TO LAPAROSCOPIC RIGHT HEMICOLECTOMY DUE TO SEVERE INFLAMMATION.  SHE HAD MILD ILEUS POST OP BUT THIS RESOLVED AFTER 2 DAYS AND DIET ADVANCED.  PAIN WELL CONTROLLED AND BOWELS MOVING. TRANSITIONED TO ORAL ANTIBIOTICS AND WBC NORMAL. TOLERATING DIET AT DISCHERGE AND HUSBAND CHANGING DRESSING TO INCISION WITHOUT DIFFICULTY.    Consults: None    Treatments: surgery: LAPAROSCOPIC HEMICOLECTOMY  Discharge Exam: Blood pressure 121/56, pulse 93, temperature 98.6 F (37 C), temperature source Oral, resp. rate 18, height 5\' 7"  (1.702 m), weight 131 lb 8 oz (59.648 kg), SpO2 97.00%. Incision/Wound:OPEN AND CLEAN.  SOFT ABDOMEN NON DISTENDED WITH MINIMAL TENDERNESS  Disposition: Final discharge disposition not confirmed  Discharge Instructions   Diet - low sodium heart healthy    Complete by:  As directed      Discharge wound care:    Complete by:  As directed   WET TO DRY DRESSING TWICE A DAY.     Driving Restrictions    Complete by:  As directed   MAY DRIVE IN 2 WEEKS     Increase activity slowly    Complete by:  As directed      Lifting restrictions    Complete by:  As directed   NO LIFTING MORE THAN 15 LBS FOR 3 WEEKS.            Medication List         amoxicillin-clavulanate 400-57 MG/5ML suspension  Commonly known as:  AUGMENTIN  Take 6.3 mLs (500 mg total) by mouth every 12 (twelve) hours.     aspirin 81 MG chewable tablet  Chew 648 mg by mouth daily as needed for headache.     HYDROcodone-acetaminophen 5-325 MG per tablet  Commonly known as:  NORCO  Take 1 tablet by mouth  every 6 (six) hours as needed for moderate pain.     multivitamin with minerals Tabs tablet  Take 1 tablet by mouth daily.     omega-3 acid ethyl esters 1 G capsule  Commonly known as:  LOVAZA  Take 1 g by mouth daily.     OVER THE COUNTER MEDICATION  Take 0.5 tablets by mouth at bedtime as needed (for sleep OTC).         Signed: Nizhoni Parlow A. 10/01/2013, 8:32 AM

## 2013-10-01 NOTE — Progress Notes (Signed)
Pharmacist from [facility] had called me earlier about transference of Rx of augmentin.  I called Mikki Santee (spouse) to make sure it had gotten transferred to the [facility] [address] correctly.  He assured me that it had and had been there to pick it up.  Everything was going well at home and there were no further questions or concerns.

## 2013-10-01 NOTE — Progress Notes (Signed)
6 Days Post-Op  Subjective: DOING WELL MOVING BOWELS AND TOLERATING DIET.  PAIN CONTROL GOOD  Objective: Vital signs in last 24 hours: Temp:  [98.2 F (36.8 C)-98.6 F (37 C)] 98.6 F (37 C) (08/09 0535) Pulse Rate:  [83-93] 93 (08/09 0535) Resp:  [16-18] 18 (08/09 0535) BP: (121-149)/(56-89) 121/56 mmHg (08/09 0535) SpO2:  [97 %-100 %] 97 % (08/09 0535) Last BM Date: 09/29/13  Intake/Output from previous day: 08/08 0701 - 08/09 0700 In: 1785 [P.O.:560; I.V.:950; IV Piggyback:275] Out: -  Intake/Output this shift:    Incision/Wound:WOUND OPEN AND CLEAN  SOFT NT ABDOMEN WITHOUT GUARDING OR REBOUND ND SORE   Lab Results:   Recent Labs  09/29/13 0015  WBC 10.1  HGB 11.2*  HCT 33.3*  PLT 395   BMET  Recent Labs  09/29/13 0015  NA 143  K 3.6*  CL 108  CO2 24  GLUCOSE 124*  BUN 10  CREATININE 0.57  CALCIUM 8.3*   PT/INR No results found for this basename: LABPROT, INR,  in the last 72 hours ABG No results found for this basename: PHART, PCO2, PO2, HCO3,  in the last 72 hours  Studies/Results: No results found.  Anti-infectives: Anti-infectives   Start     Dose/Rate Route Frequency Ordered Stop   09/30/13 1200  amoxicillin-clavulanate (AUGMENTIN) 250-62.5 MG/5ML suspension 500 mg  Status:  Discontinued     500 mg Oral Every 12 hours 09/30/13 1100 09/30/13 1151   09/30/13 1200  amoxicillin-clavulanate (AUGMENTIN) 400-57 MG/5ML suspension 500 mg     500 mg Oral Every 12 hours 09/30/13 1151     09/30/13 1000  amoxicillin-clavulanate (AUGMENTIN) 400-57 MG/5ML suspension 400 mg  Status:  Discontinued     400 mg Oral Every 12 hours 09/30/13 0856 09/30/13 1100   09/25/13 0500  metroNIDAZOLE (FLAGYL) IVPB 500 mg  Status:  Discontinued     500 mg 100 mL/hr over 60 Minutes Intravenous Every 8 hours 09/25/13 0447 09/30/13 1100   09/25/13 0500  cefTRIAXone (ROCEPHIN) 2 g in dextrose 5 % 50 mL IVPB  Status:  Discontinued     2 g 100 mL/hr over 30 Minutes Intravenous  Every 24 hours 09/25/13 0447 09/30/13 0856      Assessment/Plan: s/p Procedure(s):  LAPAROSCOPIC APPENDECTOMY CONVERTED TO LAPAROSCOPIC ASSISTED RIGHT COLECTOMY (N/A) Discharge  LOS: 6 days    Donna Hawkins A. 10/01/2013

## 2013-10-01 NOTE — Progress Notes (Signed)
Pt discharged to home of dtr who lives in [place] (pt lives in [place] but is not going back there until after her postop check.  Pts husband is involved in her care and will do the dressing changes at home.  He has completed this twice with RN supervision and is doing a great job.  Supplies given for them to take home to do the dressing changes.  Amoxicillin Rx was sent electronically to the [facility] in [place] but will call and have them send it to the [facility] [address].  (Have attempted to call already but they are closed until 1100).  Pt given tonights dose of Amoxicillin to take tonight just in case they are unable to get the Pharmacy changed today.  Pt and husband understand all of this and are totally competent to follow up on any issues.  Gave them my # here at hospital as well so they can call me if they are any issues.  They are to FU with Dr. [name] at [facility] in 7-10 days per Dr. Brantley Stage and they understand this.  Dr. Brantley Stage left a message for his office staff to call them tomorrow but I also told the pt/husb to call them if they had not heard from them by mid day tomorrow and gave them the # for [facility] if there are any concerns.

## 2013-10-01 NOTE — Discharge Instructions (Signed)
[facility]      [facility] [phone]  OPEN ABDOMINAL SURGERY: POST OP INSTRUCTIONS  Always review your discharge instruction sheet given to you by the facility where your surgery was performed.  IF YOU HAVE DISABILITY OR FAMILY LEAVE FORMS, YOU MUST BRING THEM TO THE OFFICE FOR PROCESSING.  PLEASE DO NOT GIVE THEM TO YOUR DOCTOR.  1. A prescription for pain medication may be given to you upon discharge.  Take your pain medication as prescribed, if needed.  If narcotic pain medicine is not needed, then you may take acetaminophen (Tylenol) or ibuprofen (Advil) as needed. 2. Take your usually prescribed medications unless otherwise directed. 3. If you need a refill on your pain medication, please contact your pharmacy. They will contact our office to request authorization.  Prescriptions will not be filled after 5pm or on week-ends. 4. You should follow a light diet the first few days after arrival home, such as soup and crackers, pudding, etc.unless your doctor has advised otherwise. A high-fiber, low fat diet can be resumed as tolerated.   Be sure to include lots of fluids daily. Most patients will experience some swelling and bruising on the chest and neck area.  Ice packs will help.  Swelling and bruising can take several days to resolve 5. Most patients will experience some swelling and bruising in the area of the incision. Ice pack will help. Swelling and bruising can take several days to resolve..  6. It is common to experience some constipation if taking pain medication after surgery.  Increasing fluid intake and taking a stool softener will usually help or prevent this problem from occurring.  A mild laxative (Milk of Magnesia or Miralax) should be taken according to package directions if there are no bowel movements after 48 hours. 7.  You may have steri-strips (small skin tapes) in place directly over the incision.  These strips should be left on the skin for 7-10 days.  If your  surgeon used skin glue on the incision, you may shower in 24 hours.  The glue will flake off over the next 2-3 weeks.  Any sutures or staples will be removed at the office during your follow-up visit. You may find that a light gauze bandage over your incision may keep your staples from being rubbed or pulled. You may shower and replace the bandage daily. 8. ACTIVITIES:  You may resume regular (light) daily activities beginning the next day--such as daily self-care, walking, climbing stairs--gradually increasing activities as tolerated.  You may have sexual intercourse when it is comfortable.  Refrain from any heavy lifting or straining until approved by your doctor. a. You may drive when you no longer are taking prescription pain medication, you can comfortably wear a seatbelt, and you can safely maneuver your car and apply brakes b. Return to Work: ___________________________________ 9. You should see your doctor in the office for a follow-up appointment approximately two weeks after your surgery.  Make sure that you call for this appointment within a day or two after you arrive home to insure a convenient appointment time. OTHER INSTRUCTIONS:  _____________________________________________________________ _____________________________________________________________  WHEN TO CALL YOUR DOCTOR: 1. Fever over 101.0 2. Inability to urinate 3. Nausea and/or vomiting 4. Extreme swelling or bruising 5. Continued bleeding from incision. 6. Increased pain, redness, or drainage from the incision. 7. Difficulty swallowing or breathing 8. Muscle cramping or spasms. 9. Numbness or tingling in hands or feet or around lips.  The clinic staff is available to   answer your questions during regular business hours.  Please don't hesitate to call and ask to speak to one of the nurses if you have concerns.  For further questions, please visit [email or web address]   

## 2013-10-04 ENCOUNTER — Telehealth (INDEPENDENT_AMBULATORY_CARE_PROVIDER_SITE_OTHER): Payer: Self-pay

## 2013-10-04 NOTE — Telephone Encounter (Signed)
Pt called office asking if they should continue abx after the prescribed 10 days. She will still have some left over after the 10th day. Her follow up appt is on 11th day. Advised Dr [name] will discuss if she needs to continue the abx at scheduled appt.

## 2013-10-10 ENCOUNTER — Encounter (INDEPENDENT_AMBULATORY_CARE_PROVIDER_SITE_OTHER): Payer: Self-pay | Admitting: General Surgery

## 2013-10-10 ENCOUNTER — Ambulatory Visit (INDEPENDENT_AMBULATORY_CARE_PROVIDER_SITE_OTHER): Payer: BC Managed Care – PPO | Admitting: General Surgery

## 2013-10-10 VITALS — BP 122/70 | HR 73 | Temp 97.4°F | Ht 67.0 in | Wt 127.0 lb

## 2013-10-10 DIAGNOSIS — Z9049 Acquired absence of other specified parts of digestive tract: Secondary | ICD-10-CM

## 2013-10-10 DIAGNOSIS — Z9889 Other specified postprocedural states: Secondary | ICD-10-CM

## 2013-10-10 NOTE — Progress Notes (Signed)
Subjective:     Patient ID: Donna Hawkins, female   DOB: [birth year], 58 y.o.   MRN: [id number]  HPI 58 year old female comes in today for her postoperative appointment after being in the hospital from August 3 through the ninth. She underwent laparoscopic appendectomy converted to laparoscopic assisted right hemicolectomy. During surgery she was found to have a perforated appendix that was gangrenous at the base. Moreover the inferior portion of her cecum was extremely thickened and inflamed it was concerning for a possible mass therefore proceeded with a right hemicolectomy. She states that she is doing well. She states her biggest issue is fatigue and energy. However it has gotten better since she has been discharged from the hospital. She is no longer on antibodies. She reports her appetite is slowly improving. She denies any fever, chills, nausea or vomiting. She denies any melena or hematochezia. She reports normal urination. She does live about 2 hours away and is currently staying with her daughter. Her lower midline incision is still getting wet-to-dry dressings  Review of Systems     Objective:   Physical Exam BP 122/70  Pulse 73  Temp(Src) 97.4 F (36.3 C)  Ht 5\' 7"  (6.837 m)  Wt 127 lb (57.607 kg)  BMI 19.89 kg/m2  Gen: alert, NAD, non-toxic appearing Pupils: equal, no scleral icterus Pulm: Lungs clear to auscultation, symmetric chest rise CV: regular rate and rhythm Abd: soft, nontender, nondistended. Well-healed trocar sites. No cellulitis. No incisional hernia. Almost completely lower midline incision - +granulation tissue. Not really that deep. About 28mm deep in some areas Ext: no edema, no calf tenderness Skin: no rash, no jaundice     Assessment:     Status post laparoscopic appendectomy converted to laparoscopic right hemicolectomy for gangrenous appendicitis     Plan:     Overall I think she is doing quite well. They were provided a copy of the formal pathology  report. We discussed it. I advised them to his no longer do wet-to-dry dressings and to just keep area covered with a dry gauze. I reminded her not to do any heavy lifting for another 4 weeks. I advised her she could slowly start resuming cardiovascular activities. I think she is doing quite well. Explained that the wound will continue to heal. Offer her the option of following up with her primary care physician or coming back and see me. Patient has elected to followup with her primary care physician. Advised patient and husband to call me with any additional questions or concerns  Leighton Ruff. [name], MD, FACS General, Bariatric, & Minimally Invasive Surgery [facility] [place]

## 2013-10-10 NOTE — Patient Instructions (Signed)
Stop wet -dry dressing; can just cover with dry gauze followup with PCP in several weeks for wound check Please call if an issue arises Can resume full activities in 4 weeks  Can do light cardio now (walking, low incline ellipitcal) Can gradually resume a high fiber diet over the next several weeks

## 2013-10-11 ENCOUNTER — Encounter (INDEPENDENT_AMBULATORY_CARE_PROVIDER_SITE_OTHER): Payer: Self-pay

## 2013-10-11 NOTE — Progress Notes (Unsigned)
Patient ID: Donna Hawkins, female   DOB: [birth year], 58 y.o.   MRN: [id number] Pt's H&P, operative report and post op note of Dr. Dois Davenport all faxed to her PCP, Hilton Cork, MD., in [place], [place]., at [phone].  Confirmation received.

## 2013-10-18 ENCOUNTER — Encounter (INDEPENDENT_AMBULATORY_CARE_PROVIDER_SITE_OTHER): Payer: BC Managed Care – PPO | Admitting: General Surgery

## 2016-12-25 DIAGNOSIS — Z23 Encounter for immunization: Secondary | ICD-10-CM | POA: Diagnosis not present

## 2018-10-11 DIAGNOSIS — R221 Localized swelling, mass and lump, neck: Secondary | ICD-10-CM | POA: Diagnosis not present

## 2018-10-25 ENCOUNTER — Ambulatory Visit: Payer: Self-pay | Admitting: Internal Medicine

## 2018-11-11 DIAGNOSIS — H43811 Vitreous degeneration, right eye: Secondary | ICD-10-CM | POA: Diagnosis not present

## 2018-11-21 DIAGNOSIS — H43811 Vitreous degeneration, right eye: Secondary | ICD-10-CM | POA: Diagnosis not present

## 2018-12-09 ENCOUNTER — Other Ambulatory Visit: Payer: Self-pay

## 2018-12-09 ENCOUNTER — Encounter: Payer: Self-pay | Admitting: Family Medicine

## 2018-12-09 ENCOUNTER — Ambulatory Visit (INDEPENDENT_AMBULATORY_CARE_PROVIDER_SITE_OTHER): Payer: BC Managed Care – PPO | Admitting: Family Medicine

## 2018-12-09 VITALS — BP 170/90 | HR 113 | Temp 98.0°F | Wt 140.0 lb

## 2018-12-09 DIAGNOSIS — I10 Essential (primary) hypertension: Secondary | ICD-10-CM

## 2018-12-09 DIAGNOSIS — M2550 Pain in unspecified joint: Secondary | ICD-10-CM

## 2018-12-09 DIAGNOSIS — R5383 Other fatigue: Secondary | ICD-10-CM | POA: Diagnosis not present

## 2018-12-09 DIAGNOSIS — G629 Polyneuropathy, unspecified: Secondary | ICD-10-CM

## 2018-12-09 DIAGNOSIS — R002 Palpitations: Secondary | ICD-10-CM | POA: Diagnosis not present

## 2018-12-09 DIAGNOSIS — Z8679 Personal history of other diseases of the circulatory system: Secondary | ICD-10-CM | POA: Diagnosis not present

## 2018-12-09 DIAGNOSIS — R109 Unspecified abdominal pain: Secondary | ICD-10-CM

## 2018-12-09 DIAGNOSIS — Z862 Personal history of diseases of the blood and blood-forming organs and certain disorders involving the immune mechanism: Secondary | ICD-10-CM | POA: Diagnosis not present

## 2018-12-09 DIAGNOSIS — Z13220 Encounter for screening for lipoid disorders: Secondary | ICD-10-CM | POA: Diagnosis not present

## 2018-12-09 LAB — LIPID PANEL
Cholesterol: 266 mg/dL — ABNORMAL HIGH (ref 0–200)
HDL: 78.4 mg/dL (ref >39.00)
LDL Cholesterol: 171 mg/dL — ABNORMAL HIGH (ref 0–99)
NonHDL: 187.15
Total CHOL/HDL Ratio: 3
Triglycerides: 83 mg/dL (ref 0.0–149.0)
VLDL: 16.6 mg/dL (ref 0.0–40.0)

## 2018-12-09 LAB — COMPREHENSIVE METABOLIC PANEL
ALT: 20 U/L (ref 0–35)
AST: 19 U/L (ref 0–37)
Albumin: 4.8 g/dL (ref 3.5–5.2)
Alkaline Phosphatase: 88 U/L (ref 39–117)
BUN: 14 mg/dL (ref 6–23)
CO2: 28 mEq/L (ref 19–32)
Calcium: 10 mg/dL (ref 8.4–10.5)
Chloride: 99 mEq/L (ref 96–112)
Creatinine, Ser: 0.7 mg/dL (ref 0.40–1.20)
GFR: 84.08 mL/min (ref >60.00)
Glucose, Bld: 99 mg/dL (ref 70–99)
Potassium: 4.4 mEq/L (ref 3.5–5.1)
Sodium: 137 mEq/L (ref 135–145)
Total Bilirubin: 0.6 mg/dL (ref 0.2–1.2)
Total Protein: 7.5 g/dL (ref 6.0–8.3)

## 2018-12-09 LAB — T4, FREE: Free T4: 0.88 ng/dL (ref 0.60–1.60)

## 2018-12-09 LAB — CBC WITH DIFFERENTIAL/PLATELET
Basophils Absolute: 0.1 10*3/uL (ref 0.0–0.1)
Basophils Relative: 0.9 % (ref 0.0–3.0)
Eosinophils Absolute: 0.1 10*3/uL (ref 0.0–0.7)
Eosinophils Relative: 0.8 % (ref 0.0–5.0)
HCT: 39.5 % (ref 36.0–46.0)
Hemoglobin: 13.5 g/dL (ref 12.0–15.0)
Lymphocytes Relative: 18.5 % (ref 12.0–46.0)
Lymphs Abs: 1.6 10*3/uL (ref 0.7–4.0)
MCHC: 34.1 g/dL (ref 30.0–36.0)
MCV: 93.8 fl (ref 78.0–100.0)
Monocytes Absolute: 0.8 10*3/uL (ref 0.1–1.0)
Monocytes Relative: 9.5 % (ref 3.0–12.0)
Neutro Abs: 6 10*3/uL (ref 1.4–7.7)
Neutrophils Relative %: 70.3 % (ref 43.0–77.0)
Platelets: 383 10*3/uL (ref 150.0–400.0)
RBC: 4.22 Mil/uL (ref 3.87–5.11)
RDW: 13.8 % (ref 11.5–15.5)
WBC: 8.6 10*3/uL (ref 4.0–10.5)

## 2018-12-09 LAB — SEDIMENTATION RATE: Sed Rate: 14 mm/hr (ref 0–30)

## 2018-12-09 LAB — TSH: TSH: 3.78 u[IU]/mL (ref 0.35–4.50)

## 2018-12-09 LAB — VITAMIN B12: Vitamin B-12: 1210 pg/mL — ABNORMAL HIGH (ref 211–911)

## 2018-12-09 LAB — C-REACTIVE PROTEIN: CRP: <1 mg/dL (ref 0.5–20.0)

## 2018-12-09 LAB — T3, FREE: T3, Free: 3.3 pg/mL (ref 2.3–4.2)

## 2018-12-09 MED ORDER — ATENOLOL 25 MG PO TABS: 25.0000 mg | ORAL_TABLET | Freq: Every day | ORAL | 3 refills | Status: DC

## 2018-12-09 NOTE — Progress Notes (Signed)
Donna Hawkins DOB: [birth year] Encounter date: 12/09/2018  This is a 63 y.o. female who presents to establish care. No chief complaint on file.   History of present illness: Dad has been in nursing home since June which has been stressful. Dog had to be put down 3 weeks ago.   Not sleeping well. Took melatonin for awhile, but didn't do much for her. Tried to do all the "good sleep hygiene" tips but not helping. Goes to bed at midnight; then wakes multiple times/night. Going to the bathroom a lot at night. Not sure if just because she is awake. Not sure what is waking. Hasn't been following with primary. Takes otc sleeping pills sometimes which helps, but then she feels groggy for full next day. Anti-histamine doesn't help as well. No snoring. Not feeling rested when she wakes up. Not slept as well with menopause.   Does get some heart pounding. Has MVP which was diagnosed years ago. Hasn't had this evaluated in quite awhile. Skips a lot. Wore a monitor years ago and they thought more benign condition at that time. Was given beta blockers for a little while, but didn't take these long (this was at least 20 years ago). Has spells where it is very irregular. Has gotten dizzy before. No passing out. Feels like heart is pounding hard. Not skipping every day, but in last several months seems to pound more. Can go a week without skipping, but then will have bad spell. Skips for minutes, but can pound for longer. Sometimes will keep her awake at night.    Also has lump center of neck. Has noticed this for several months. Thought it was just fatty, but then worried about it because it is near thyroid.   Sometimes feels that throat is tight. Neck feels like it is getting squeezed. Sometimes feels like she can't get enough air.   Was blaming sx on stress for a long time. Dad was living with her, then daughter was pregnant and born with anal malformation - so underwent three surgeries. 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   Stomach burns. Was diagnosed with gastritis 10 years ago. 9 years ago got to where she couldn't swallow easily. Even lost significant weight. Had endoscopy and evaluation and never found anything. Even had botox injection in cricothyroid musculature. Has adapted, but eats slower in general. Cannot swallow pills. Stomach burns intermittently. Was on acid blocking medications for awhile. Hasn't had colonoscopy since rectal bleeding 20 years ago. Had transfusion, colonoscopy, endoscopy and didn't find anything wrong.   Had basal cell cancer removed from nose in past. Saw dermatology in [place], but hasn't established here.   She was taken in for acute appendicitis and they did laproscopic converted to right hemicolectomy due to severe inflammation.   Last mammogram was a long time ago.   In 2000, was told that she might have autoimmune disease. Not sure why they were testing, but she was told there could be something going on. No further evaluation. She recalls later in visit that she had facial rash and joint stiffness that prompted this evaluation.   When she checks bp at home is between 140-170/90.    Past Medical History:  Diagnosis Date  . Blood in stool   . Chicken pox   . Dysphagia   . GERD (gastroesophageal reflux disease)   . High blood pressure   . Mitral valve prolapse    Past Surgical History:  Procedure Laterality  Date  . BREAST BIOPSY  2001   benign  . COLON SURGERY  2015   right hemicolectomy done at time of appendectomy  . LAPAROSCOPIC APPENDECTOMY N/A 09/25/2013   Procedure:  LAPAROSCOPIC APPENDECTOMY CONVERTED TO LAPAROSCOPIC ASSISTED RIGHT COLECTOMY;  Surgeon: Gayland Curry, MD;  Location: [facility];  Service: General;  Laterality: N/A;  . TONSILLECTOMY     No Known Allergies Current Meds  Medication Sig  . aspirin 81 MG chewable tablet Chew 648 mg by mouth daily as needed for headache.  Marland Kitchen atenolol (TENORMIN) 25 MG tablet Take  1 tablet (25 mg total) by mouth daily.  . Coenzyme Q10 (CO Q 10 PO) Take by mouth.  . Multiple Vitamin (MULTIVITAMIN WITH MINERALS) TABS tablet Take 1 tablet by mouth daily.  . [DISCONTINUED] atenolol (TENORMIN) 25 MG tablet Take 1 tablet (25 mg total) by mouth daily.   Social History   Tobacco Use  . Smoking status: Never Smoker  . Smokeless tobacco: Never Used  Substance Use Topics  . Alcohol use: Yes    Comment: 1 drink, watered down 4 times/week   Family History  Problem Relation Age of Onset  . Stroke Mother 68       brain hemorrhage  . Healthy Father   . Autoimmune disease Neg Hx      Review of Systems  Constitutional: Negative for chills, fatigue and fever.  HENT: Positive for trouble swallowing.   Respiratory: Negative for cough, chest tightness, shortness of breath and wheezing.   Cardiovascular: Positive for palpitations. Negative for chest pain and leg swelling.  Gastrointestinal: Positive for abdominal pain.  Psychiatric/Behavioral: Positive for sleep disturbance. The patient is nervous/anxious.     Objective:  BP (!) 170/90 (BP Location: Left Arm, Patient Position: Sitting, Cuff Size: Normal)   Pulse (!) 113   Temp 98 F (36.7 C) (Temporal)   Wt 140 lb (63.5 kg)   SpO2 97%   BMI 21.93 kg/m   Weight: 140 lb (63.5 kg)   BP Readings from Last 3 Encounters:  12/09/18 (!) 170/90  10/10/13 122/70  10/01/13 (!) 121/56   Wt Readings from Last 3 Encounters:  12/09/18 140 lb (63.5 kg)  10/10/13 127 lb (57.6 kg)  09/24/13 131 lb 8 oz (59.6 kg)    Physical Exam Constitutional:      General: She is not in acute distress.    Appearance: She is well-developed.  Cardiovascular:     Rate and Rhythm: Normal rate and regular rhythm.     Heart sounds: Murmur present. Systolic murmur present with a grade of 2/6. No friction rub.  Pulmonary:     Effort: Pulmonary effort is normal. No respiratory distress.     Breath sounds: Normal breath sounds. No wheezing or  rales.  Musculoskeletal:     Right lower leg: No edema.     Left lower leg: No edema.  Neurological:     Mental Status: She is alert and oriented to person, place, and time.  Psychiatric:        Behavior: Behavior normal.     Assessment/Plan:  1. Palpitations This is ongoing issue for her, and her primary concern today.  EKG is normal sinus rhythm and stable from previous 5 years ago.  We discussed starting with blood work and then considering further follow-up pending this.  I will order an echo as well secondary to history of mitral valve prolapse.  Her blood pressure has been running higher, and I do think starting  atenolol daily for blood pressure as well as heart rate control. - EKG 12-Lead - TSH; Future - T4, free; Future - T3, free; Future - T3, free - T4, free - TSH - atenolol (TENORMIN) 25 MG tablet; Take 1 tablet (25 mg total) by mouth daily.  Dispense: 90 tablet; Refill: 3  2. History of mitral valve prolapse States it has been 20 years since her previous echo, I think we need to reestablish baseline. - ECHOCARDIOGRAM COMPLETE; Future  3. Stomach pain Seems to be an ongoing issue for her.  Consider GI evaluation in the future, but she wishes to pursue cardiac evaluation first.  4. Other fatigue - CBC with Differential/Platelet; Future - Comprehensive metabolic panel; Future - Comprehensive metabolic panel - CBC with Differential/Platelet  5. Lipid screening - Lipid panel; Future - Lipid panel  6. Neuropathy - Vitamin B12; Future - Vitamin B12  7. History of autoimmune disorder Uncertain of what this autoimmune disorder was before.  She does states she had a facial rash so perhaps lupus?  Does have some ongoing joint discomfort, but no obvious inflammation of the joints noted on exam. - Sedimentation rate; Future - ANA; Future - C-reactive protein; Future - Rheumatoid factor; Future - Rheumatoid factor - C-reactive protein - ANA - Sedimentation  rate  8. Arthralgia, unspecified joint See above.  9. Hypertension, unspecified type Blood pressure elevated today.  Start atenolol once daily.  She has difficulty with swallowing pills so once daily options that she is able to crush are ideal. - atenolol (TENORMIN) 25 MG tablet; Take 1 tablet (25 mg total) by mouth daily.  Dispense: 90 tablet; Refill: 3  Return for pending bloodwork.  Micheline Rough, MD

## 2018-12-10 LAB — RHEUMATOID FACTOR: Rhuematoid fact SerPl-aCnc: <14 IU/mL (ref <14)

## 2018-12-10 LAB — ANA: Anti Nuclear Antibody (ANA): NEGATIVE

## 2018-12-13 ENCOUNTER — Other Ambulatory Visit: Payer: Self-pay

## 2018-12-13 ENCOUNTER — Ambulatory Visit (HOSPITAL_COMMUNITY): Payer: BC Managed Care – PPO | Attending: Cardiology

## 2018-12-13 DIAGNOSIS — Z8679 Personal history of other diseases of the circulatory system: Secondary | ICD-10-CM | POA: Insufficient documentation

## 2019-01-13 ENCOUNTER — Other Ambulatory Visit: Payer: Self-pay

## 2019-01-16 ENCOUNTER — Encounter: Payer: Self-pay | Admitting: Family Medicine

## 2019-01-16 ENCOUNTER — Ambulatory Visit (INDEPENDENT_AMBULATORY_CARE_PROVIDER_SITE_OTHER): Payer: BC Managed Care – PPO | Admitting: Family Medicine

## 2019-01-16 ENCOUNTER — Other Ambulatory Visit: Payer: Self-pay

## 2019-01-16 VITALS — BP 130/90 | HR 67 | Temp 97.6°F | Ht 67.0 in | Wt 141.0 lb

## 2019-01-16 DIAGNOSIS — I10 Essential (primary) hypertension: Secondary | ICD-10-CM

## 2019-01-16 DIAGNOSIS — R109 Unspecified abdominal pain: Secondary | ICD-10-CM | POA: Diagnosis not present

## 2019-01-16 DIAGNOSIS — Z23 Encounter for immunization: Secondary | ICD-10-CM | POA: Diagnosis not present

## 2019-01-16 DIAGNOSIS — R002 Palpitations: Secondary | ICD-10-CM

## 2019-01-16 DIAGNOSIS — G4700 Insomnia, unspecified: Secondary | ICD-10-CM | POA: Diagnosis not present

## 2019-01-16 MED ORDER — TRAZODONE HCL 50 MG PO TABS: 25.0000 mg | ORAL_TABLET | Freq: Every evening | ORAL | 3 refills | Status: DC | PRN

## 2019-01-16 MED ORDER — ALPRAZOLAM 0.25 MG PO TABS: 0.2500 mg | ORAL_TABLET | Freq: Every day | ORAL | 0 refills | Status: DC | PRN

## 2019-01-16 NOTE — Patient Instructions (Signed)
Why is Exercise Important? If I told you I had a single pill that would help you decrease stress by improving anxiety, decreasing depression, help you achieve a healthy weight, give you more energy, make you more productive, help you focus, decrease your risk of dementia/heart attack/stroke/falls, improve your bone health, and more would you be interested? These are just some of the benefits that exercise brings to you. IT IS WORTH carving out some time every day to fit in exercise. It will help in every aspect of your health. Even if you have injuries that prevent you from participating in a type of exercise you used to do; there is always something that you can do to keep exercise a part of your life. If improving your health is important, make exercise your priority. It is worth the time! If you have questions about the type of exercise that is right for you, please talk with me about this!     Exercising to Stay Healthy  Exercising regularly is important. It has many health benefits, such as:  Improving your overall fitness, flexibility, and endurance.  Increasing your bone density.  Helping with weight control.  Decreasing your body fat.  Increasing your muscle strength.  Reducing stress and tension.  Improving your overall health.   In order to become healthy and stay healthy, it is recommended that you do moderate-intensity and vigorous-intensity exercise. You can tell that you are exercising at a moderate intensity if you have a higher heart rate and faster breathing, but you are still able to hold a conversation. You can tell that you are exercising at a vigorous intensity if you are breathing much harder and faster and cannot hold a conversation while exercising. How often should I exercise? Choose an activity that you enjoy and set realistic goals. Your health care provider can help you to make an activity plan that works for you. Exercise regularly as directed by your health care  provider. This may include:  Doing resistance training twice each week, such as: ? Push-ups. ? Sit-ups. ? Lifting weights. ? Using resistance bands.  Doing a given intensity of exercise for a given amount of time. Choose from these options: ? 150 minutes of moderate-intensity exercise every week. ? 75 minutes of vigorous-intensity exercise every week. ? A mix of moderate-intensity and vigorous-intensity exercise every week.   Children, pregnant women, people who are out of shape, people who are overweight, and older adults may need to consult a health care provider for individual recommendations. If you have any sort of medical condition, be sure to consult your health care provider before starting a new exercise program. What are some exercise ideas? Some moderate-intensity exercise ideas include:  Walking at a rate of 1 mile in 15 minutes.  Biking.  Hiking.  Golfing.  Dancing.   Some vigorous-intensity exercise ideas include:  Walking at a rate of at least 4.5 miles per hour.  Jogging or running at a rate of 5 miles per hour.  Biking at a rate of at least 10 miles per hour.  Lap swimming.  Roller-skating or in-line skating.  Cross-country skiing.  Vigorous competitive sports, such as football, basketball, and soccer.  Jumping rope.  Aerobic dancing.   What are some everyday activities that can help me to get exercise?  Yard work, such as: ? Pushing a lawn mower. ? Raking and bagging leaves.  Washing and waxing your car.  Pushing a stroller.  Shoveling snow.  Gardening.  Washing windows or   floors. How can I be more active in my day-to-day activities?  Use the stairs instead of the elevator.  Take a walk during your lunch break.  If you drive, park your car farther away from work or school.  If you take public transportation, get off one stop early and walk the rest of the way.  Make all of your phone calls while standing up and walking  around.  Get up, stretch, and walk around every 30 minutes throughout the day. What guidelines should I follow while exercising?  Do not exercise so much that you hurt yourself, feel dizzy, or get very short of breath.  Consult your health care provider before starting a new exercise program.  Wear comfortable clothes and shoes with good support.  Drink plenty of water while you exercise to prevent dehydration or heat stroke. Body water is lost during exercise and must be replaced.  Work out until you breathe faster and your heart beats faster. This information is not intended to replace advice given to you by your health care provider. Make sure you discuss any questions you have with your health care provider.  

## 2019-01-16 NOTE — Progress Notes (Signed)
Donna Hawkins DOB: Apr 16, 1955 Encounter date: 01/16/2019  This is a 63 y.o. female who presents with Chief Complaint  Patient presents with  . Follow-up    History of present illness: Last visit we discussed palpitations.  She was started on atenolol to help with blood pressure as well as heart rate control. Not much change in palpitations. Doesn't get them every day. Wore monitor years ago - said she skipped some beats. Has been checking at home. States that at home it is lower 111/55. Highest she got was 149/79. Uses arm cuff. Has been checked against office cuff in last few years and was pretty accurate. Does feel that HR is slower than it was prior to the atenolol. Hasn't noted any triggers for this.   We also discussed stomach pain, but held off on referral so that palpitations can be evaluated first. Still having stomach pain. Would like to see GI, but insurance is changing in Jan. Still gets some knotting in stomach; pain.   We also investigated for history of autoimmune disorder which she had been told she had, but could not remember what it was. Labwork was negative. She was disappointed as she was hoping that there was something easy to fix.   On blood work, B12 is too high, she using her urged to cut back on this. She was taking a multivitamin with small amount of B12.   Cholesterol is high, but she declines starting a statin.  Cardiac echo ordered due to history of mitral valve prolapse was stable with mild mitral valve prolapse.  Still has a lot of anxiety. Just doesn't cope well. Was on xanax in the past; helped her sleep. She can do a low dose and sleeps well. Usually tries to go to sleep at midnight, but has trouble falling and staying asleep.    No Known Allergies Current Meds  Medication Sig  . atenolol (TENORMIN) 25 MG tablet Take 1 tablet (25 mg total) by mouth daily.  . Coenzyme Q10 (CO Q 10 PO) Take by mouth.  . LUTEIN PO Take by mouth daily.  . Multiple  Vitamin (MULTIVITAMIN WITH MINERALS) TABS tablet Take 1 tablet by mouth daily.    Review of Systems  Constitutional: Negative for chills, fatigue and fever.  Respiratory: Negative for cough, chest tightness, shortness of breath and wheezing.   Cardiovascular: Negative for chest pain, palpitations and leg swelling.    Objective:  BP 130/90 (BP Location: Left Arm, Patient Position: Sitting, Cuff Size: Normal)   Pulse 67   Temp 97.6 F (36.4 C) (Temporal)   Ht 5\' 7"  (1.702 m)   Wt 141 lb (64 kg)   SpO2 97%   BMI 22.08 kg/m   Weight: 141 lb (64 kg)   BP Readings from Last 3 Encounters:  01/16/19 130/90  12/09/18 (!) 170/90  10/10/13 122/70   Wt Readings from Last 3 Encounters:  01/16/19 141 lb (64 kg)  12/09/18 140 lb (63.5 kg)  10/10/13 127 lb (57.6 kg)    Physical Exam Constitutional:      General: She is not in acute distress.    Appearance: She is well-developed.  Cardiovascular:     Rate and Rhythm: Normal rate and regular rhythm.     Heart sounds: Normal heart sounds. No murmur. No friction rub.  Pulmonary:     Effort: Pulmonary effort is normal. No respiratory distress.     Breath sounds: Normal breath sounds. No wheezing or rales.  Musculoskeletal:  Right lower leg: No edema.     Left lower leg: No edema.  Neurological:     Mental Status: She is alert and oriented to person, place, and time.  Psychiatric:        Behavior: Behavior normal.     Assessment/Plan 1. Palpitations These are stable. Continue to monitor.   2. Hypertension, unspecified type Improved control; elevated here but better at home.   3. Abdominal pain, unspecified abdominal location Referral will be placed; this is more chronic.  - Ambulatory referral to Gastroenterology  4. Insomnia, unspecified type She is going to try the sleep medication.  - traZODone (DESYREL) 50 MG tablet; Take 0.5-1 tablets (25-50 mg total) by mouth at bedtime as needed for sleep.  Dispense: 30 tablet;  Refill: 3 - ALPRAZolam (XANAX) 0.25 MG tablet; Take 1 tablet (0.25 mg total) by mouth daily as needed for anxiety or sleep.  Dispense: 30 tablet; Refill: 0  5. Need for Tdap; completed today.   Return for pending update in 3 week.    Micheline Rough, MD

## 2019-02-07 ENCOUNTER — Encounter: Payer: Self-pay | Admitting: Family Medicine

## 2019-02-08 NOTE — Telephone Encounter (Signed)
I like the average, and numbers seem better than those in office overall. I would suggest we have her return in 3 months for a physical in the office. Please hep her schedule.

## 2019-03-02 ENCOUNTER — Encounter: Payer: Self-pay | Admitting: Gastroenterology

## 2019-03-13 ENCOUNTER — Ambulatory Visit: Payer: Self-pay | Admitting: *Deleted

## 2019-03-13 ENCOUNTER — Other Ambulatory Visit: Payer: Self-pay | Admitting: Family Medicine

## 2019-03-13 DIAGNOSIS — G4700 Insomnia, unspecified: Secondary | ICD-10-CM

## 2019-03-13 MED ORDER — ALPRAZOLAM 0.25 MG PO TABS: 0.2500 mg | ORAL_TABLET | Freq: Two times a day (BID) | ORAL | 0 refills | Status: DC | PRN

## 2019-03-13 NOTE — Telephone Encounter (Signed)
Message sent to PCP for review

## 2019-03-13 NOTE — Telephone Encounter (Signed)
Patient has been under a lot of stress- father passed in January- patient has been having a hard time coping. Worst symptoms- heart pounding, when it slows- she feels weak and shaky.  Patient was seen in October for similar symptoms- she thinks the Atenolol may be causing drop in heart rate- advised ED per protocol- patient does not want to go- advised would send message to PCP for review- but she may also advise ED.  Reason for Disposition . Dizziness, lightheadedness, or weakness  Answer Assessment - Initial Assessment Questions 1. DESCRIPTION: "Please describe your heart rate or heart beat that you are having" (e.g., fast/slow, regular/irregular, skipped or extra beats, "palpitations")     Fast/slow- beating hard 2. ONSET: "When did it start?" (Minutes, hours or days)      Worse in the last 2 weeks 3. DURATION: "How long does it last" (e.g., seconds, minutes, hours)     hours 4. PATTERN "Does it come and go, or has it been constant since it started?"  "Does it get worse with exertion?"   "Are you feeling it now?"     Comes and goes 5. TAP: "Using your hand, can you tap out what you are feeling on a chair or table in front of you, so that I can hear?" (Note: not all patients can do this)       More apparent during the past week 6. HEART RATE: "Can you tell me your heart rate?" "How many beats in 15 seconds?"  (Note: not all patients can do this)       P- 74 7. RECURRENT SYMPTOM: "Have you ever had this before?" If so, ask: "When was the last time?" and "What happened that time?"      Patient was seen in October for this- rate is slowing into 50's 8. CAUSE: "What do you think is causing the palpitations?"     Stress- ? 9. CARDIAC HISTORY: "Do you have any history of heart disease?" (e.g., heart attack, angina, bypass surgery, angioplasty, arrhythmia)      Mitral valve prolapse 10. OTHER SYMPTOMS: "Do you have any other symptoms?" (e.g., dizziness, chest pain, sweating, difficulty  breathing)       Shaky, weak, lightheaded 11. PREGNANCY: "Is there any chance you are pregnant?" "When was your last menstrual period?"       n/a  Protocols used: HEART RATE AND HEARTBEAT QUESTIONS-A-AH

## 2019-03-13 NOTE — Telephone Encounter (Signed)
*  Donna Hawkins:please check in on weds  I spoke with patient.  She was still grieving over the loss of her dog that we discussed at her last visit, but then two weekends ago was informed that her dad (who is in a nursing home) had Covid, and 2 days later he passed away.  Since this, she is not felt as well, and felt increased stress.  For the last 1 and half weeks, she has felt weak, dizzy, heart pounding, lightheaded, and some chest tightness.  She got concerned when she noted a heart rate of 50.  This did increase when she got up and started walking to the high 60s.  When asked about heart pounding, she states that it is not beating fast, she is just more aware of it beating.  The chest tightness does not increase with exertion, and she is able to walk and climb stairs without any increased discomfort.  She does feel like the chest tightness improved with Xanax.  She does not notice that her heart is skipping beats like it used to.  Her most recent blood pressure is 133/77.  She was worried about using the Xanax regularly, because she had such a small supply.  She found the trazodone to be very sedating for her so stopped using this.  Xanax does help her sleep, and seems to help with her symptoms above.  Without Xanax, she is not sleeping well.  She is eating and drinking okay.  She does have a good support system including her husband and children.  I advised her to cut her atenolol in half and I will send in a refill of her Xanax to use as needed to help with current anxiety.  I have urged her to seek evaluation if she has any worsening of symptoms, but encouraged her that she can always call with questions.  We can schedule follow-up pending her response on Wednesday.

## 2019-03-15 NOTE — Telephone Encounter (Signed)
Patient informed of the message below.

## 2019-03-15 NOTE — Telephone Encounter (Signed)
Let's have her stay off the atenolol and can you check in with her on Friday again? Just have her monitor HR and BP in meanwhile. We can set up further visit based on update Friday. I suspect she will feel better off atenolol.

## 2019-03-15 NOTE — Telephone Encounter (Signed)
Spoke with the patient and she stated she is feeling better, still complains of weakness and some of the same symptoms.  Stated hear rate was 54 last night and she did not take Atenolol today due to this.  BP reading this AM was 120/77-pulse 60.  Message sent to PCP.

## 2019-03-17 NOTE — Telephone Encounter (Signed)
Patient stated she is feeling the same.  BP 133/83 and readings have been in different ranges with the highest being 141/82.  Stated her pulse still low 50s in the evenings.  Message sent to PCP.

## 2019-03-17 NOTE — Telephone Encounter (Signed)
Spoke with the pt and informed her of the message below.  Patient stated her pulse is currently 72 and she denies feeling worse, stated she feels the same.  Patient stated she feels she can await a visit on Monday, agreed to go the ER or an urgent care if needed prior to the appt.  Appt will be scheduled for Monday to arrive at 10:15am and message sent to PCP.

## 2019-03-17 NOTE — Telephone Encounter (Signed)
She needs an appointment in office. Please see what pulse is now? If below 60 I would suggest she be seen today. If she has any worsening of sx like dizziness, heart racing, she needs evaluation today. I do have appointments on Monday so if she feels stable and current HR is over 60; we can plan for 30 minute slot Monday but should be instructed to go to urgent care/ER if worsening sx over weekend.

## 2019-03-17 NOTE — Telephone Encounter (Signed)
noted 

## 2019-03-20 ENCOUNTER — Other Ambulatory Visit: Payer: Self-pay

## 2019-03-20 ENCOUNTER — Ambulatory Visit (INDEPENDENT_AMBULATORY_CARE_PROVIDER_SITE_OTHER): Payer: BC Managed Care – PPO | Admitting: Family Medicine

## 2019-03-20 ENCOUNTER — Encounter: Payer: Self-pay | Admitting: Family Medicine

## 2019-03-20 ENCOUNTER — Telehealth: Payer: Self-pay | Admitting: *Deleted

## 2019-03-20 VITALS — BP 140/70 | HR 106 | Temp 98.0°F | Ht 67.0 in | Wt 139.8 lb

## 2019-03-20 DIAGNOSIS — R002 Palpitations: Secondary | ICD-10-CM | POA: Diagnosis not present

## 2019-03-20 DIAGNOSIS — R0789 Other chest pain: Secondary | ICD-10-CM

## 2019-03-20 NOTE — Patient Instructions (Signed)
You should get a phone call before the end of the week from cardiology regarding picking up your holter monitor.   I will be back in touch with you once I see these results.

## 2019-03-20 NOTE — Progress Notes (Signed)
Donna Hawkins DOB: Jun 04, 1955 Encounter date: 03/20/2019  This is a 64 y.o. female who presents with Chief Complaint  Patient presents with  . Follow-up    History of present illness: Symptoms started over 2 weeks ago. 4 days after dad died felt weak, dizzy, heart pounding and racing. Chest tight, throat tight. Felt like breathing fast. Would have some days that felt a little better, but then next day would feel bad again. Just couldn't get enough air in lungs. Feels like this happens whenever she is stressed (has been going on for a year and a half).   Doesn't feel like xanax is helping a lot.   Has kept track of bp since last Monday. Highest was a week ago. Sunday night HR was down to 50. Chest felt weird at that time. bp that day (still on atenolol) was 133/77. Next day was first day she called here - highest was 140/88 and lowest was 125/70. HR down to 50 in evening. Would be just sitting on couch, relaxing, talking with husband and HR would go down to 50 without anything and jump up to 90. Feels "vibrating" when this happens. Then on Tuesday with half atenolol lowest was 117/74 and highest was 126/72 HR still in 50's at night. No atenolol weds. 113/69 low 137/80 high. That night noted heart rate being more erratic. Next day without atenolol - got out of bed and took a few steps - heart up to 110. 135/84. Friday morning 110/83 and later 144/88. 137/87 with HR 106. Has been similar since with highs in the 140's and lows around 120. Still noting fluctuating heart rate. Since stopping atenolol lowest HR she has seen was 56.   Felt really tired yesterday. With activity is noting increased pounding.   Doesn't feel that breathing is worse with episodes. Not sweaty. Hasn't been walking as much lately.    No Known Allergies Current Meds  Medication Sig  . ALPRAZolam (XANAX) 0.25 MG tablet Take 1 tablet (0.25 mg total) by mouth 2 (two) times daily as needed for anxiety or sleep.  .  Coenzyme Q10 (CO Q 10 PO) Take by mouth.  . LUTEIN PO Take by mouth daily.  . Multiple Vitamin (MULTIVITAMIN WITH MINERALS) TABS tablet Take 1 tablet by mouth daily.    Review of Systems  Constitutional: Negative for chills, fatigue and fever.  Respiratory: Positive for chest tightness. Negative for cough, shortness of breath and wheezing.   Cardiovascular: Positive for palpitations. Negative for chest pain and leg swelling.  Endocrine:       Hot flashes    Objective:  BP 140/70 (BP Location: Left Arm, Patient Position: Sitting, Cuff Size: Normal)   Pulse (!) 106   Temp 98 F (36.7 C) (Temporal)   Ht 5\' 7"  (1.702 m)   Wt 139 lb 12.8 oz (63.4 kg)   SpO2 98%   BMI 21.90 kg/m   Weight: 139 lb 12.8 oz (63.4 kg)   BP Readings from Last 3 Encounters:  03/20/19 140/70  01/16/19 130/90  12/09/18 (!) 170/90   Wt Readings from Last 3 Encounters:  03/20/19 139 lb 12.8 oz (63.4 kg)  01/16/19 141 lb (64 kg)  12/09/18 140 lb (63.5 kg)    Physical Exam Constitutional:      General: She is not in acute distress.    Appearance: She is well-developed.  Cardiovascular:     Rate and Rhythm: Normal rate and regular rhythm.     Heart sounds: Normal heart  sounds. No murmur. No friction rub.  Pulmonary:     Effort: Pulmonary effort is normal. No respiratory distress.     Breath sounds: Normal breath sounds. No wheezing or rales.  Musculoskeletal:     Right lower leg: No edema.     Left lower leg: No edema.  Neurological:     Mental Status: She is alert and oriented to person, place, and time.  Psychiatric:        Behavior: Behavior normal.     Assessment/Plan  1. Chest tightness EKG in the office is stable.  No acute changes.  Normal sinus rhythm.  2. Palpitations Symptoms really came on after increased stress of loss of her father.  Because she is experiencing regular palpitations, we are going to get a Holter monitor for further evaluation.  Follow-up will be pending this.   She had blood work that was very stable back in October, so I do not feel that we need to repeat this presently.  - EKG 12-Lead - Holter monitor - 48 hour; Future    Return for pending monitor report.     Micheline Rough, MD

## 2019-03-20 NOTE — Telephone Encounter (Signed)
Patient enrolled for [employer] to mail a 3 day ZIO XT long term holter monitor to her home.  Instructions reviewed briefly as they are included in the monitor kit.

## 2019-03-24 ENCOUNTER — Ambulatory Visit (INDEPENDENT_AMBULATORY_CARE_PROVIDER_SITE_OTHER): Payer: BC Managed Care – PPO

## 2019-03-24 DIAGNOSIS — R002 Palpitations: Secondary | ICD-10-CM

## 2019-04-03 ENCOUNTER — Encounter: Payer: Self-pay | Admitting: Gastroenterology

## 2019-04-03 ENCOUNTER — Ambulatory Visit: Payer: Medicare PPO | Admitting: Gastroenterology

## 2019-04-03 ENCOUNTER — Telehealth: Payer: Self-pay | Admitting: Gastroenterology

## 2019-04-03 VITALS — BP 170/110 | HR 128 | Temp 98.1°F | Ht 67.0 in | Wt 137.4 lb

## 2019-04-03 DIAGNOSIS — G8929 Other chronic pain: Secondary | ICD-10-CM

## 2019-04-03 DIAGNOSIS — R1310 Dysphagia, unspecified: Secondary | ICD-10-CM

## 2019-04-03 DIAGNOSIS — R1013 Epigastric pain: Secondary | ICD-10-CM

## 2019-04-03 DIAGNOSIS — Z01818 Encounter for other preprocedural examination: Secondary | ICD-10-CM | POA: Diagnosis not present

## 2019-04-03 MED ORDER — NA SULFATE-K SULFATE-MG SULF 17.5-3.13-1.6 GM/177ML PO SOLN: 1.0000 | ORAL | 0 refills | Status: DC

## 2019-04-03 MED ORDER — OMEPRAZOLE 2 MG/ML ORAL SUSPENSION: 40.0000 mg | Freq: Every day | ORAL | 0 refills | Status: DC

## 2019-04-03 MED ORDER — OMEPRAZOLE 40 MG PO CPDR: 40.0000 mg | DELAYED_RELEASE_CAPSULE | Freq: Every day | ORAL | 3 refills | Status: DC

## 2019-04-03 NOTE — Telephone Encounter (Signed)
Pt's husband called and stated that omeprazole suspension is over $200 and that they cannot afford it.

## 2019-04-03 NOTE — Patient Instructions (Signed)
We have sent the following medications to your pharmacy for you to pick up at your convenience:  Omeprazole 40 mg every morning 30-60 minutes before breakfast  You have been scheduled for an endoscopy and colonoscopy. Please follow the written instructions given to you at your visit today. Please pick up your prep supplies at the pharmacy within the next 1-3 days. If you use inhalers (even only as needed), please bring them with you on the day of your procedure.  I value your feedback and thank you for entrusting [place] with your care. If you get a [facility] patient survey, I would appreciate you taking the time to let us know about your experience today. Thank you!   Due to recent changes in healthcare laws, you may see the results of your imaging and laboratory studies on MyChart before your provider has had a chance to review them.  We understand that in some cases there may be results that are confusing or concerning to you. Not all laboratory results come back in the same time frame and the provider may be waiting for multiple results in order to interpret others.  Please give [place] 48 hours in order for your provider to thoroughly review all the results before contacting the office for clarification of your results.

## 2019-04-03 NOTE — Progress Notes (Signed)
Referring Provider: [name], MD Primary Care Physician:  [name], MD  Reason for Consultation: Abdominal pain   IMPRESSION:  Epigastric abdominal pain "stomach burning" x 10 years    - associated early satiety, bloating Globus x 10 years Recent palpitations History of gastritis History of dysphagia History of unexplained GI bleed 20 years ago    -Colonoscopy and EGD at that time did not reveal a source Hypertensive upper cricopharyngeal    - s/p botox injection at [place]  Severe pill dysphagia Wants to avoid long-term medications Appendectomy and colon resection 2015  Longstanding history of globus and dysphagia. Extensively evaluated in the past. Prior botox injection at [facility] in 2011. Will obtain prior records.  Dyspepsia: Must exclude dyspepsia due to organic disease such as peptic ulcer disease, gastroesophageal reflux, medications (nonsteroidal anti-inflammatory agents being the most common offender), biliary pain, gastroparesis, pancreatitis, carbohydrate malabsorption, celiac, bacterial overgrowth, malignancy (gastric, esophageal, and pancreatitis), and infections such as Giardia. Differential also includes functional dyspepsia. EGD recommended. Trial of omeprazole elixir in the meantime.   Need for colon cancer screening: Last colonoscopy >10 years ago. Colonoscopy recommended at this time.    PLAN: Omeprazole elixir 54m QAM Obtain records from prior gastroenterologist [facility]  EGD Colonoscopy  The nature of the procedure, as well as the risks, benefits, and alternatives were carefully and thoroughly reviewed with the patient. Ample time for discussion and questions allowed. The patient understood, was satisfied, and agreed to proceed.  I spent 70 minutes of time, including in depth chart review, independent review of results as outlined above, communicating results with the patient directly,  face-to-face time with the patient, coordinating care, ordering studies and medications as appropriate, and documentation.   HPI: Donna Ventressis a 64y.o. female referred by Dr. KEthlyn Galleryfor further evaluation of abdominal pain.  The history is obtained through the patient and review of her electronic health record. She has hypercholesterolemia,, hypertension, mild mitral valve prolapse, anxiety, and has recently been under evaluation for palpitations. Symptoms improved on Atenolol. Four days after her father died she developed mild bradycardia and the Atenolol was discontinued.  Dad died last month in the nursing home due to Covid. Her dog of 15 years died [id number]   [facility]  No source was identified on a a colonoscopy or upper endoscopy at that time.  Blood transfusion was required.  The etiology of the bleeding was not identified.  Had abdominal complaints in 2008.  [facility]  Developed dysphagia in May 2011.  Evaluated with endoscopy, a barium swallow study, esophageal manometry under the direction of Dr. TJeneen Rinks  She was evaluated by speech therapy and [place].  She was also treated with 2 sessions of hypnosis, acupuncture, and chiropractic care. She is unsure of the final diagnosis.  September 2011 [facility]   In August 2015 she had acute appendicitis [facility]  Today she presents for evaluation of "Stomach burning" further described as notting in the stomach.  Associated chest tightness. Occurs every couple of weeks and lasts about an hour. Present intermittently over the last 10 years. Initially diagnosed with gastritis on EGD with Dr. TRose Fillers2008.  Associated early satiety  and  bloating Rare heartburn No nausea Not associated with eating, movement, or defecation No change over the last 10 years. She needs to have a colonoscopy and wonders if she needs to have an EGD at the same time.    Also reports a lump in her throat. Present since her dysphagia developed in 2011.  Intermittent, nonpainful sensation of a lump or foreign body in the throat that occurs between meals. Intermittent heartburn and regurgitation. Occasionally feels hoarse. There is no pain, lateralization of the symptoms, dysphagia, odynophagia, or weight loss.   There is no history of radiation to the head neck, smoking. Drinks a couple of glasses of red wine or ice each week.   Feels like water goes down slowly. Often has to lean forward.  Took a PPI but she didn't find that it helped.  Occasionally wakes her from sleep.  Diagnosed with gastritis 10 years ago.    Ongoing pill dysphagia.   No known family history of colon cancer or polyps. No family history of uterine/endometrial cancer, pancreatic cancer or gastric/stomach cancer.  Labs from 12/09/2018 showing normal CBC with a hemoglobin of 13.5, MCV 93.8, RDW 13.8, platelets 383.  She also had a normal comprehensive metabolic panel with normal liver enzymes.  CRP less than 1.  ESR 14.  TSH 3.78.  Prior abdominal imaging from 2015 showed a CT of the abdomen and pelvis with contrast revealing acute appendicitis and suspicion for a possible underlying mucinous neoplasm of the appendix.   Past Medical History:  Diagnosis Date  . Blood in stool   . Chicken pox   . Dysphagia   . GERD (gastroesophageal reflux disease)   . High blood pressure   . Mitral valve prolapse     Past Surgical History:  Procedure Laterality Date  . BREAST BIOPSY  2001   benign  . COLON SURGERY  2015   right hemicolectomy done at time of appendectomy  . LAPAROSCOPIC APPENDECTOMY N/A 09/25/2013   Procedure:  LAPAROSCOPIC APPENDECTOMY CONVERTED TO LAPAROSCOPIC  ASSISTED RIGHT COLECTOMY;  Surgeon: Gayland Curry, MD;  Location: [facility];  Service: General;  Laterality: N/A;  . TONSILLECTOMY      Current Outpatient Medications  Medication Sig Dispense Refill  . ALPRAZolam (XANAX) 0.25 MG tablet Take 1 tablet (0.25 mg total) by mouth 2 (two) times daily as needed for anxiety or sleep. 60 tablet 0  . Coenzyme Q10 (CO Q 10 PO) Take by mouth.    . LUTEIN PO Take by mouth daily.    . Multiple Vitamin (MULTIVITAMIN WITH MINERALS) TABS tablet Take 1 tablet by mouth daily.     No current facility-administered medications for this visit.    Allergies as of 04/03/2019  . (No Known Allergies)    Family History  Problem Relation Age of Onset  . Stroke Mother 29       brain hemorrhage  . COPD Father   . Autoimmune disease Neg Hx     Social History   Socioeconomic History  . Marital status: Married    Spouse name: Not on file  . Number of children: Not on file  . Years of education: Not on file  . Highest education level: Not on file  Occupational History  . Not on file  Tobacco Use  . Smoking status: Never Smoker  . Smokeless tobacco: Never Used  Substance and Sexual Activity  . Alcohol use: Yes    Comment: 1 drink, watered down 4 times/week  . Drug  use: No  . Sexual activity: Not on file  Other Topics Concern  . Not on file  Social History Narrative  . Not on file   Social Determinants of Health   Financial Resource Strain:   . Difficulty of Paying Living Expenses: Not on file  Food Insecurity:   . Worried About Charity fundraiser in the Last Year: Not on file  . Ran Out of Food in the Last Year: Not on file  Transportation Needs:   . Lack of Transportation (Medical): Not on file  . Lack of Transportation (Non-Medical): Not on file  Physical Activity:   . Days of Exercise per Week: Not on file  . Minutes of Exercise per Session: Not on file  Stress:   . Feeling of Stress : Not on file  Social Connections:   . Frequency of  Communication with Friends and Family: Not on file  . Frequency of Social Gatherings with Friends and Family: Not on file  . Attends Religious Services: Not on file  . Active Member of Clubs or Organizations: Not on file  . Attends Archivist Meetings: Not on file  . Marital Status: Not on file  Intimate Partner Violence:   . Fear of Current or Ex-Partner: Not on file  . Emotionally Abused: Not on file  . Physically Abused: Not on file  . Sexually Abused: Not on file    Review of Systems: 12 system ROS is negative except as noted above with the additions of frequent rhinorrhea, anxiety, depression after the death of her dog and father, fatigue, shortness of breath, insomnia.Marland Kitchen   Physical Exam: General:   Alert,  well-nourished, pleasant and cooperative in NAD Head:  Normocephalic and atraumatic. Eyes:  Sclera clear, no icterus.   Conjunctiva pink. Ears:  Normal auditory acuity. Nose:  No deformity, discharge,  or lesions. Mouth:  No deformity or lesions.   Neck:  Supple; no masses or thyromegaly. Lungs:  Clear throughout to auscultation.   No wheezes. Heart:  Regular rate and rhythm; no murmurs. Abdomen:  Soft,nontender, nondistended, normal bowel sounds, no rebound or guarding. No hepatosplenomegaly.   Rectal:  Deferred  Msk:  Symmetrical. No boney deformities LAD: No inguinal or umbilical LAD Extremities:  No clubbing or edema. Neurologic:  Alert and  oriented x4;  grossly nonfocal Skin:  Intact without significant lesions or rashes. Psych:  Alert and cooperative. Normal mood and affect.     Donna Hawkins L. Tarri Glenn, MD, MPH 04/03/2019, 8:24 AM

## 2019-04-03 NOTE — Telephone Encounter (Signed)
Spoke with patient she agrees that she could tolerate opening the capsule and sprinkling it on applesauce or something of the same consistency. New Rx called into pharmacy.

## 2019-04-07 ENCOUNTER — Encounter: Payer: Self-pay | Admitting: Family Medicine

## 2019-04-07 NOTE — Progress Notes (Signed)
Tried to call patient, but got voicemail.  Sent patient my chart message.

## 2019-04-09 ENCOUNTER — Other Ambulatory Visit: Payer: Self-pay | Admitting: Family Medicine

## 2019-04-09 MED ORDER — LOSARTAN POTASSIUM 25 MG PO TABS: 25.0000 mg | ORAL_TABLET | Freq: Every day | ORAL | 1 refills | Status: DC

## 2019-04-10 ENCOUNTER — Encounter: Payer: Self-pay | Admitting: Gastroenterology

## 2019-04-12 ENCOUNTER — Other Ambulatory Visit: Payer: Self-pay | Admitting: Gastroenterology

## 2019-04-13 LAB — SARS CORONAVIRUS 2 (TAT 6-24 HRS): SARS Coronavirus 2: NEGATIVE

## 2019-04-17 ENCOUNTER — Encounter: Payer: Self-pay | Admitting: Gastroenterology

## 2019-04-17 ENCOUNTER — Ambulatory Visit (AMBULATORY_SURGERY_CENTER): Payer: Medicare PPO | Admitting: Gastroenterology

## 2019-04-17 ENCOUNTER — Other Ambulatory Visit: Payer: Self-pay

## 2019-04-17 VITALS — BP 149/76 | HR 79 | Temp 97.3°F | Resp 12 | Ht 67.0 in | Wt 137.0 lb

## 2019-04-17 DIAGNOSIS — K2950 Unspecified chronic gastritis without bleeding: Secondary | ICD-10-CM

## 2019-04-17 DIAGNOSIS — K3189 Other diseases of stomach and duodenum: Secondary | ICD-10-CM | POA: Diagnosis not present

## 2019-04-17 DIAGNOSIS — R1013 Epigastric pain: Secondary | ICD-10-CM | POA: Diagnosis not present

## 2019-04-17 DIAGNOSIS — D122 Benign neoplasm of ascending colon: Secondary | ICD-10-CM

## 2019-04-17 DIAGNOSIS — G8929 Other chronic pain: Secondary | ICD-10-CM

## 2019-04-17 DIAGNOSIS — D123 Benign neoplasm of transverse colon: Secondary | ICD-10-CM | POA: Diagnosis not present

## 2019-04-17 DIAGNOSIS — K219 Gastro-esophageal reflux disease without esophagitis: Secondary | ICD-10-CM | POA: Diagnosis not present

## 2019-04-17 DIAGNOSIS — Z1211 Encounter for screening for malignant neoplasm of colon: Secondary | ICD-10-CM

## 2019-04-17 DIAGNOSIS — K2100 Gastro-esophageal reflux disease with esophagitis, without bleeding: Secondary | ICD-10-CM | POA: Diagnosis not present

## 2019-04-17 DIAGNOSIS — D124 Benign neoplasm of descending colon: Secondary | ICD-10-CM

## 2019-04-17 DIAGNOSIS — R1310 Dysphagia, unspecified: Secondary | ICD-10-CM

## 2019-04-17 MED ORDER — SODIUM CHLORIDE 0.9 % IV SOLN: 500.0000 mL | Freq: Once | INTRAVENOUS | Status: DC

## 2019-04-17 NOTE — Op Note (Signed)
[facility] Patient Name: Donna Hawkins Procedure Date: 04/17/2019 10:34 AM MRN: [id number] Endoscopist: Thornton Park MD, MD Age: 64 Referring MD:  Date of Birth: 05-Jun-1955 Gender: Female Account #: [id number] Procedure:                Colonoscopy Indications:              Screening for colorectal malignant neoplasm                           Last colonoscopy >15 years ago                           Acute appendicitis requiring appendectomy and right                            hemicolectomy 2015                           No known family history of colon cancer or polyps Medicines:                Monitored Anesthesia Care Procedure:                Pre-Anesthesia Assessment:                           - Prior to the procedure, a History and Physical                            was performed, and patient medications and                            allergies were reviewed. The patient's tolerance of                            previous anesthesia was also reviewed. The risks                            and benefits of the procedure and the sedation                            options and risks were discussed with the patient.                            All questions were answered, and informed consent                            was obtained. Prior Anticoagulants: The patient has                            taken no previous anticoagulant or antiplatelet                            agents. ASA Grade Assessment: II - A patient with  mild systemic disease. After reviewing the risks                            and benefits, the patient was deemed in                            satisfactory condition to undergo the procedure.                           After obtaining informed consent, the colonoscope                            was passed under direct vision. Throughout the                            procedure, the patient's blood pressure, pulse, and              oxygen saturations were monitored continuously. The                            Colonoscope was introduced through the anus and                            advanced to the the ileocolonic anastomosis. A                            second forward view of the right colon was                            performed. The colonoscopy was performed without                            difficulty. The patient tolerated the procedure                            well. The quality of the bowel preparation was                            good. The terminal ileum, ileocecal valve,                            appendiceal orifice, and rectum were photographed. Scope In: 10:46:28 AM Scope Out: 11:01:53 AM Scope Withdrawal Time: 0 hours 11 minutes 49 seconds  Total Procedure Duration: 0 hours 15 minutes 25 seconds  Findings:                 The perianal and digital rectal examinations were                            normal.                           Multiple small and large-mouthed diverticula were  found in the sigmoid colon and descending colon.                           Three sessile polyps were found in the descending                            colon, hepatic flexure and ascending colon. The                            polyps were 1 mm in size. These polyps were removed                            with a cold snare. Resection and retrieval were                            complete. Estimated blood loss: none.                           Non-bleeding internal hemorrhoids were found. The                            hemorrhoids were small.                           The ileocolonic anastamosis was seen. No                            ulceration, erythema, or irregularity identified.                            The exam was otherwise without abnormality on                            direct and retroflexion views. Complications:            No immediate complications. Estimated blood loss:                             None. Estimated Blood Loss:     Estimated blood loss: none. Impression:               - Diverticulosis in the sigmoid colon and in the                            descending colon.                           - Three 1 mm polyps in the descending colon, at the                            hepatic flexure and in the ascending colon, removed                            with a cold snare. Resected and retrieved.                           -  Non-bleeding internal hemorrhoids.                           - No obvious source for abdominal pain identified                            on this examination.                           - The examination was otherwise normal on direct                            and retroflexion views. Recommendation:           - Patient has a contact number available for                            emergencies. The signs and symptoms of potential                            delayed complications were discussed with the                            patient. Return to normal activities tomorrow.                            Written discharge instructions were provided to the                            patient.                           - High fiber diet.                           - Continue present medications.                           - Await pathology results.                           - Repeat colonoscopy date to be determined after                            pending pathology results are reviewed for                            surveillance.                           - Follow-up in the office with me to further                            address abdominal pain. Thornton Park MD, MD 04/17/2019 11:20:40 AM This report has been signed electronically.

## 2019-04-17 NOTE — Progress Notes (Signed)
Temp taken by [facility] VS taken by DT

## 2019-04-17 NOTE — Progress Notes (Signed)
Called to room to assist during endoscopic procedure.  Patient ID and intended procedure confirmed with present staff. Received instructions for my participation in the procedure from the performing physician.  

## 2019-04-17 NOTE — Op Note (Signed)
[facility] Patient Name: Donna Hawkins Procedure Date: 04/17/2019 10:34 AM MRN: [id number] Endoscopist: Thornton Park MD, MD Age: 64 Referring MD:  Date of Birth: 08/07/1955 Gender: Female Account #: [id number] Procedure:                Upper GI endoscopy Indications:              Dyspepsia, Globus sensation Medicines:                Monitored Anesthesia Care Procedure:                Pre-Anesthesia Assessment:                           - Prior to the procedure, a History and Physical                            was performed, and patient medications and                            allergies were reviewed. The patient's tolerance of                            previous anesthesia was also reviewed. The risks                            and benefits of the procedure and the sedation                            options and risks were discussed with the patient.                            All questions were answered, and informed consent                            was obtained. Prior Anticoagulants: The patient has                            taken no previous anticoagulant or antiplatelet                            agents. ASA Grade Assessment: II - A patient with                            mild systemic disease. After reviewing the risks                            and benefits, the patient was deemed in                            satisfactory condition to undergo the procedure.                           After obtaining informed consent, the endoscope was  passed under direct vision. Throughout the                            procedure, the patient's blood pressure, pulse, and                            oxygen saturations were monitored continuously. The                            Endoscope was introduced through the mouth, and                            advanced to the third part of duodenum. The upper                            GI endoscopy was  accomplished without difficulty.                            The patient tolerated the procedure well. Scope In: Scope Out: Findings:                 LA Grade A (one or more mucosal breaks less than 5                            mm, not extending between tops of 2 mucosal folds)                            esophagitis with no bleeding was found. Biopsies                            were taken with a cold forceps for histology.                            Estimated blood loss was minimal.                           The entire examined stomach was normal. Biopsies                            were taken with a cold forceps for histology.                            Estimated blood loss was minimal.                           The examined duodenum was normal.                           The cardia and gastric fundus were normal on                            retroflexion. Complications:            No immediate complications. Estimated blood loss:  Minimal. Estimated Blood Loss:     Estimated blood loss was minimal. Impression:               - LA Grade A reflux esophagitis with no bleeding.                            Biopsied.                           - Normal stomach. Biopsied.                           - Normal examined duodenum. Recommendation:           - Patient has a contact number available for                            emergencies. The signs and symptoms of potential                            delayed complications were discussed with the                            patient. Return to normal activities tomorrow.                            Written discharge instructions were provided to the                            patient.                           - Resume previous diet.                           - Continue present medications.                           - No aspirin, ibuprofen, naproxen, or other                            non-steroidal anti-inflammatory drugs.                            - Await pathology results.                           - Proceed with colonoscopy today as previously                            planned. Thornton Park MD, MD 04/17/2019 11:10:20 AM This report has been signed electronically.

## 2019-04-17 NOTE — Patient Instructions (Addendum)
Please see handouts given to you on Polyps,Esophagitis, High Fiber Diet and Hemorrhoids. Thank you for letting us take care of your healthcare needs today.   YOU HAD AN ENDOSCOPIC PROCEDURE TODAY AT [facility]:   Refer to the procedure report that was given to you for any specific questions about what was found during the examination.  If the procedure report does not answer your questions, please call your gastroenterologist to clarify.  If you requested that your care partner not be given the details of your procedure findings, then the procedure report has been included in a sealed envelope for you to review at your convenience later.  YOU SHOULD EXPECT: Some feelings of bloating in the abdomen. Passage of more gas than usual.  Walking can help get rid of the air that was put into your GI tract during the procedure and reduce the bloating. If you had a lower endoscopy (such as a colonoscopy or flexible sigmoidoscopy) you may notice spotting of blood in your stool or on the toilet paper. If you underwent a bowel prep for your procedure, you may not have a normal bowel movement for a few days.  Please Note:  You might notice some irritation and congestion in your nose or some drainage.  This is from the oxygen used during your procedure.  There is no need for concern and it should clear up in a day or so.  SYMPTOMS TO REPORT IMMEDIATELY:   Following lower endoscopy (colonoscopy or flexible sigmoidoscopy):  Excessive amounts of blood in the stool  Significant tenderness or worsening of abdominal pains  Swelling of the abdomen that is new, acute  Fever of 100F or higher   Following upper endoscopy (EGD)  Vomiting of blood or coffee ground material  New chest pain or pain under the shoulder blades  Painful or persistently difficult swallowing  New shortness of breath  Fever of 100F or higher  Black, tarry-looking stools  For urgent or emergent issues, a  gastroenterologist can be reached at any hour by calling [phone].   DIET:  We do recommend a small meal at first, but then you may proceed to your regular diet.  Drink plenty of fluids but you should avoid alcoholic beverages for 24 hours.  ACTIVITY:  You should plan to take it easy for the rest of today and you should NOT DRIVE or use heavy machinery until tomorrow (because of the sedation medicines used during the test).    FOLLOW UP: Our staff will call the number listed on your records 48-72 hours following your procedure to check on you and address any questions or concerns that you may have regarding the information given to you following your procedure. If we do not reach you, we will leave a message.  We will attempt to reach you two times.  During this call, we will ask if you have developed any symptoms of COVID 19. If you develop any symptoms (ie: fever, flu-like symptoms, shortness of breath, cough etc.) before then, please call [phone].  If you test positive for Covid 19 in the 2 weeks post procedure, please call and report this information to [place].    If any biopsies were taken you will be contacted by phone or by letter within the next 1-3 weeks.  Please call us at [phone] if you have not heard about the biopsies in 3 weeks.    SIGNATURES/CONFIDENTIALITY: You and/or your care partner have signed paperwork which will be entered  into your electronic medical record.  These signatures attest to the fact that that the information above on your After Visit Summary has been reviewed and is understood.  Full responsibility of the confidentiality of this discharge information lies with you and/or your care-partner.

## 2019-04-17 NOTE — Progress Notes (Signed)
A and O x3. Report to RN. Tolerated MAC anesthesia well.Teeth unchanged after procedure.

## 2019-04-19 ENCOUNTER — Telehealth: Payer: Self-pay | Admitting: *Deleted

## 2019-04-19 ENCOUNTER — Telehealth: Payer: Self-pay

## 2019-04-19 NOTE — Telephone Encounter (Signed)
  Follow up Call-  Call back number 04/17/2019  Post procedure Call Back phone  # [phone]  Permission to leave phone message Yes  Some recent data might be hidden     Patient questions:  Do you have a fever, pain , or abdominal swelling? No. Pain Score  0 *  Have you tolerated food without any problems? Yes.    Have you been able to return to your normal activities? Yes.    Do you have any questions about your discharge instructions: Diet   No. Medications  No. Follow up visit  No.  Do you have questions or concerns about your Care? No.  Actions: * If pain score is 4 or above: No action needed, pain <4.  1. Have you developed a fever since your procedure? no  2.   Have you had an respiratory symptoms (SOB or cough) since your procedure? no  3.   Have you tested positive for COVID 19 since your procedure no  4.   Have you had any family members/close contacts diagnosed with the COVID 19 since your procedure?  no   If yes to any of these questions please route to Joylene John, RN and Alphonsa Gin, Therapist, sports.

## 2019-04-19 NOTE — Telephone Encounter (Signed)
Left message

## 2019-04-20 ENCOUNTER — Encounter: Payer: Self-pay | Admitting: *Deleted

## 2019-04-21 ENCOUNTER — Encounter: Payer: Self-pay | Admitting: Family Medicine

## 2019-04-21 ENCOUNTER — Encounter: Payer: Self-pay | Admitting: Gastroenterology

## 2019-05-04 ENCOUNTER — Encounter: Payer: Self-pay | Admitting: Family Medicine

## 2019-05-04 DIAGNOSIS — R0789 Other chest pain: Secondary | ICD-10-CM

## 2019-05-04 DIAGNOSIS — I341 Nonrheumatic mitral (valve) prolapse: Secondary | ICD-10-CM | POA: Insufficient documentation

## 2019-05-04 DIAGNOSIS — R002 Palpitations: Secondary | ICD-10-CM

## 2019-05-14 NOTE — Progress Notes (Signed)
Cardiology Office Note:   Date:  05/15/2019  NAME:  Donna Hawkins    MRN: [id number] DOB:  01-22-1956   PCP:  [name], MD  Cardiologist:  No primary care provider on file.   Referring MD: [name], MD   Chief Complaint  Patient presents with  . Palpitations   History of Present Illness:   Donna Hawkins is a 65 y.o. female with a hx of mitral valve prolapse, mild MR, palpitations who is being seen today for the evaluation of mitral valve prolapse at the request of Koberlein, Junell C, MD.  She reports for the last 2 to 3 months she has daily episodes of a sensation of heart racing.  She feels that her heart is beating out of her chest.  She did complete a 3-day Zio patch which showed no significant arrhythmias.  I did review with her in office.  When she did complain of heart racing she had normal sinus rhythm with heart rate in 60s.  She also apparently had sinus tachycardia with episodes with heart rate around 105.  She had no significant arrhythmias.  An echocardiogram also was obtained that showed normal left-ventricular function shows mild anterior mitral valve prolapse as well as mild mitral regurgitation.  He has no symptoms from this.  She does report intermittent shortness of breath as well.  This can occur anytime of motion repeated during activity.  Her most recent thyroid studies from October 2020 were normal with TSH 3.78.  She also had a cholesterol profile that time which showed total cholesterol 266 HDL 78 LDL 171.  Her triglycerides were 83.  She also reports that she has stomach pain and has been diagnosed with gastritis on EGD.  She does have no limitations when she walks but her symptoms do occur intermittently she reports that her dog died in 11/22/2022.  This was rather tough on her.  She is quite tearful over this.  She also lost her father in January for Covid.  He was 95 but this was out of the blue for her.  She reports she is still  recovering from both of these losses.  I do sympathize with her.  She is a never smoker.  She reports overall her symptoms have improved with Xanax.  Her blood pressure is elevated today.  She does report that she has whitecoat hypertension.  She reports she is been significantly stressed over this appointment today.  She is tachycardic with heart rate 108.  She is obviously tearful and anxious on my examination.  Problem List 1. HTN 2. Mitral valve prolapse -mild AMVL, mild MR 2021 3. Palpitations -normal zio patch   Past Medical History: Past Medical History:  Diagnosis Date  . Anxiety   . Basal cell carcinoma   . Blood in stool   . Chicken pox   . Dysphagia   . GERD (gastroesophageal reflux disease)   . High blood pressure   . HLD (hyperlipidemia)   . Mitral valve prolapse     Past Surgical History: Past Surgical History:  Procedure Laterality Date  . APPENDECTOMY  2015   Open appendectomy with right colectomy  . BREAST BIOPSY Right 2001   benign  . COLON SURGERY  2015   right hemicolectomy done at time of appendectomy  . LAPAROSCOPIC APPENDECTOMY N/A 09/25/2013   Procedure:  LAPAROSCOPIC APPENDECTOMY CONVERTED TO LAPAROSCOPIC ASSISTED RIGHT COLECTOMY;  Surgeon: Gayland Curry, MD;  Location: [facility];  Service: General;  Laterality: N/A;  . TONSILLECTOMY      Current Medications: Current Meds  Medication Sig  . ALPRAZolam (XANAX) 0.25 MG tablet Take 1 tablet (0.25 mg total) by mouth 2 (two) times daily as needed for anxiety or sleep.  . Coenzyme Q10 (CO Q 10 PO) Take by mouth.  . losartan (COZAAR) 25 MG tablet Take 1 tablet (25 mg total) by mouth daily.  . Melatonin 3 MG TABS Take 1.5 mg by mouth at bedtime.  . Multiple Vitamin (MULTIVITAMIN WITH MINERALS) TABS tablet Take 1 tablet by mouth daily.  Marland Kitchen omeprazole (PRILOSEC) 40 MG capsule Take 1 capsule (40 mg total) by mouth daily. Open capsule and sprinkle in applesauce     Allergies:    Patient has no known allergies.     Social History: Social History   Socioeconomic History  . Marital status: Married    Spouse name: Not on file  . Number of children: 2  . Years of education: Not on file  . Highest education level: Not on file  Occupational History  . Occupation: retired  Tobacco Use  . Smoking status: Never Smoker  . Smokeless tobacco: Never Used  Substance and Sexual Activity  . Alcohol use: Yes    Comment: 1 drink, watered down 4 times/week  . Drug use: No  . Sexual activity: Not on file  Other Topics Concern  . Not on file  Social History Narrative  . Not on file   Social Determinants of Health   Financial Resource Strain:   . Difficulty of Paying Living Expenses:   Food Insecurity:   . Worried About Charity fundraiser in the Last Year:   . Arboriculturist in the Last Year:   Transportation Needs:   . Film/video editor (Medical):   Marland Kitchen Lack of Transportation (Non-Medical):   Physical Activity:   . Days of Exercise per Week:   . Minutes of Exercise per Session:   Stress:   . Feeling of Stress :   Social Connections:   . Frequency of Communication with Friends and Family:   . Frequency of Social Gatherings with Friends and Family:   . Attends Religious Services:   . Active Member of Clubs or Organizations:   . Attends Archivist Meetings:   Marland Kitchen Marital Status:      Family History: The patient's family history includes COPD in her father; Heart disease (age of onset: 76) in her paternal grandfather; Hypertension in her daughter; Irritable bowel syndrome in her mother; Ovarian cancer in her maternal grandmother; Stroke (age of onset: 25) in her mother. There is no history of Autoimmune disease, Colon cancer, Rectal cancer, Stomach cancer, or Esophageal cancer.  ROS:   All other ROS reviewed and negative. Pertinent positives noted in the HPI.     EKGs/Labs/Other Studies Reviewed:   The following studies were personally reviewed by me today:  EKG:  EKG is ordered  today.  The ekg ordered today demonstrates sinus tachycardia, heart rate 108, incomplete right bundle branch block, normal intervals noted, and was personally reviewed by me.   TTE 12/13/2018 1. Left ventricular ejection fraction, by visual estimation, is 60 to  65%. The left ventricle has normal function. Normal left ventricular size.  There is mildly increased left ventricular hypertrophy.  2. Global right ventricle has normal systolic function.The right  ventricular size is normal. No increase in right ventricular wall  thickness.  3. The mitral valve is abnormal. Mild anterior leaflet  prolapse. Mild  mitral valve regurgitation.  4. Left atrial size was normal.  5. Right atrial size was normal.  6. The tricuspid valve is normal in structure. Tricuspid valve  regurgitation is trivial.  7. The aortic valve is tricuspid Aortic valve regurgitation was not  visualized by color flow Doppler.  8. The pulmonic valve was not well visualized. Pulmonic valve  regurgitation is trivial by color flow Doppler.  9. Normal pulmonary artery systolic pressure.  10. The inferior vena cava is normal in size with greater than 50%  respiratory variability, suggesting right atrial pressure of 3 mmHg.  Zio patch 04/05/2019 NSR average HR 74 bpm PAC/PVC <1% total beats One 4 beat run atrial tachycardia No arrhythmia with symptoms  No significant arrhythmias  Recent Labs: 12/09/2018: ALT 20; BUN 14; Creatinine, Ser 0.70; Hemoglobin 13.5; Platelets 383.0; Potassium 4.4; Sodium 137; TSH 3.78   Recent Lipid Panel    Component Value Date/Time   CHOL 266 (H) 12/09/2018 1410   TRIG 83.0 12/09/2018 1410   HDL 78.40 12/09/2018 1410   CHOLHDL 3 12/09/2018 1410   VLDL 16.6 12/09/2018 1410   LDLCALC 171 (H) 12/09/2018 1410    Physical Exam:   VS:  BP (!) 190/100 (BP Location: Right Arm, Patient Position: Sitting, Cuff Size: Normal)   Pulse (!) 108   Ht 5\' 7"  (1.702 m)   Wt 134 lb 6.4 oz (61 kg)    SpO2 99%   BMI 21.05 kg/m    Wt Readings from Last 3 Encounters:  05/15/19 134 lb 6.4 oz (61 kg)  04/17/19 137 lb (62.1 kg)  04/03/19 137 lb 6 oz (62.3 kg)    General: Well nourished, well developed, in no acute distress Heart: Atraumatic, normal size  Eyes: PEERLA, EOMI  Neck: Supple, no JVD Endocrine: No thryomegaly Cardiac: Normal S1, S2; RRR; no murmurs, rubs, or gallops Lungs: Clear to auscultation bilaterally, no wheezing, rhonchi or rales  Abd: Soft, nontender, no hepatomegaly  Ext: No edema, pulses 2+ Musculoskeletal: No deformities, BUE and BLE strength normal and equal Skin: Warm and dry, no rashes   Neuro: Alert and oriented to person, place, time, and situation, CNII-XII grossly intact, no focal deficits  Psych: Normal mood and affect   ASSESSMENT:   Donna Hawkins is a 64 y.o. female who presents for the following: 1. Mitral valve prolapse   2. Nonrheumatic mitral valve regurgitation   3. Essential hypertension   4. Palpitations     PLAN:   1. Mitral valve prolapse 2. Nonrheumatic mitral valve regurgitation -Mild anterior mitral valve leaflet prolapse.  Trivial mitral vegetation on my review.  I suspect this will never be an issue for her life.  She will see [place] as needed for this.  3. Essential hypertension -Blood pressure is elevated today.  She has whitecoat hypertension.  She is extremely stressed and anxious.  Overall I suspect most of her symptoms are related to anxiety and stress.  She had 2 recent losses of her dog as well as her father who died of coronavirus in 03/19/22.  She is tearful today on examination and quite stressed.  I suspect most of her symptoms are anxiety related.  She reports her blood pressure is within normal limits at home.  I will not make any changes to her medications today.  4. Palpitations -Stress related.  Recent Zio patch without any significant arrhythmias.  Echocardiogram which is mild anterior mitral valve leaflet  prolapse with trivial mitral regurgitation.  Xanax  has improved her symptoms.  No significant arrhythmias.  I did go over this extensively with her.  She had thyroid studies in October which were normal.  I recommended she undergo repeat thyroid studies as well as a CBC.  She reports she will see her primary care physician in April who will check office.  Overall she will see [place] on an as-needed basis.   Disposition: Return if symptoms worsen or fail to improve.  Medication Adjustments/Labs and Tests Ordered: Current medicines are reviewed at length with the patient today.  Concerns regarding medicines are outlined above.  Orders Placed This Encounter  Procedures  . EKG 12-Lead   No orders of the defined types were placed in this encounter.   Patient Instructions  Medication Instructions:  The current medical regimen is effective;  continue present plan and medications.  *If you need a refill on your cardiac medications before your next appointment, please call your pharmacy*   Follow-Up: At [facility], you and your health needs are our priority.  As part of our continuing mission to provide you with exceptional heart care, we have created designated Provider Care Teams.  These Care Teams include your primary Cardiologist (physician) and Advanced Practice Providers (APPs -  Physician Assistants and Nurse Practitioners) who all work together to provide you with the care you need, when you need it.  We recommend signing up for the patient portal called "MyChart".  Sign up information is provided on this After Visit Summary.  MyChart is used to connect with patients for Virtual Visits (Telemedicine).  Patients are able to view lab/test results, encounter notes, upcoming appointments, etc.  Non-urgent messages can be sent to your provider as well.   To learn more about what you can do with MyChart, go to [email or web address].    Your next appointment:   As needed  The format for your  next appointment:   Either In Person or Virtual  Provider:   Eleonore Chiquito, MD      Signed, Addison Naegeli. Audie Box, [place]  [address] [place], [place] [address] [phone]  05/15/2019 11:57 AM

## 2019-05-15 ENCOUNTER — Other Ambulatory Visit: Payer: Self-pay

## 2019-05-15 ENCOUNTER — Ambulatory Visit: Payer: Medicare PPO | Admitting: Cardiovascular Disease

## 2019-05-15 ENCOUNTER — Encounter: Payer: Self-pay | Admitting: Cardiovascular Disease

## 2019-05-15 VITALS — BP 190/100 | HR 108 | Ht 67.0 in | Wt 134.4 lb

## 2019-05-15 DIAGNOSIS — I10 Essential (primary) hypertension: Secondary | ICD-10-CM | POA: Diagnosis not present

## 2019-05-15 DIAGNOSIS — R002 Palpitations: Secondary | ICD-10-CM | POA: Diagnosis not present

## 2019-05-15 DIAGNOSIS — I340 Nonrheumatic mitral (valve) insufficiency: Secondary | ICD-10-CM | POA: Diagnosis not present

## 2019-05-15 DIAGNOSIS — I341 Nonrheumatic mitral (valve) prolapse: Secondary | ICD-10-CM | POA: Diagnosis not present

## 2019-05-15 NOTE — Patient Instructions (Signed)
Medication Instructions:  The current medical regimen is effective;  continue present plan and medications.  *If you need a refill on your cardiac medications before your next appointment, please call your pharmacy*   Follow-Up: At [facility], you and your health needs are our priority.  As part of our continuing mission to provide you with exceptional heart care, we have created designated Provider Care Teams.  These Care Teams include your primary Cardiologist (physician) and Advanced Practice Providers (APPs -  Physician Assistants and Nurse Practitioners) who all work together to provide you with the care you need, when you need it.  We recommend signing up for the patient portal called "MyChart".  Sign up information is provided on this After Visit Summary.  MyChart is used to connect with patients for Virtual Visits (Telemedicine).  Patients are able to view lab/test results, encounter notes, upcoming appointments, etc.  Non-urgent messages can be sent to your provider as well.   To learn more about what you can do with MyChart, go to [email or web address].    Your next appointment:   As needed  The format for your next appointment:   Either In Person or Virtual  Provider:   [place] O'Neal, MD      

## 2019-05-16 ENCOUNTER — Encounter: Payer: Self-pay | Admitting: Family Medicine

## 2019-05-19 ENCOUNTER — Encounter: Payer: BC Managed Care – PPO | Admitting: Family Medicine

## 2019-05-24 ENCOUNTER — Encounter: Payer: BC Managed Care – PPO | Admitting: Family Medicine

## 2019-06-05 ENCOUNTER — Ambulatory Visit: Payer: Medicare PPO | Admitting: Gastroenterology

## 2019-06-09 ENCOUNTER — Other Ambulatory Visit: Payer: Self-pay

## 2019-06-12 ENCOUNTER — Encounter: Payer: Self-pay | Admitting: Family Medicine

## 2019-06-12 ENCOUNTER — Other Ambulatory Visit (HOSPITAL_COMMUNITY)
Admission: RE | Admit: 2019-06-12 | Discharge: 2019-06-12 | Disposition: A | Discharge status: Home / Self Care | Payer: Medicare PPO | Source: Ambulatory Visit | Attending: Family Medicine | Admitting: Family Medicine

## 2019-06-12 ENCOUNTER — Other Ambulatory Visit: Payer: Self-pay

## 2019-06-12 ENCOUNTER — Ambulatory Visit (INDEPENDENT_AMBULATORY_CARE_PROVIDER_SITE_OTHER): Payer: Medicare PPO | Admitting: Family Medicine

## 2019-06-12 VITALS — BP 160/102 | HR 96 | Temp 97.3°F | Ht 67.0 in | Wt 131.7 lb

## 2019-06-12 DIAGNOSIS — M25511 Pain in right shoulder: Secondary | ICD-10-CM

## 2019-06-12 DIAGNOSIS — E678 Other specified hyperalimentation: Secondary | ICD-10-CM | POA: Diagnosis not present

## 2019-06-12 DIAGNOSIS — E2839 Other primary ovarian failure: Secondary | ICD-10-CM | POA: Diagnosis not present

## 2019-06-12 DIAGNOSIS — E559 Vitamin D deficiency, unspecified: Secondary | ICD-10-CM | POA: Diagnosis not present

## 2019-06-12 DIAGNOSIS — R002 Palpitations: Secondary | ICD-10-CM | POA: Diagnosis not present

## 2019-06-12 DIAGNOSIS — Z1151 Encounter for screening for human papillomavirus (HPV): Secondary | ICD-10-CM | POA: Insufficient documentation

## 2019-06-12 DIAGNOSIS — R946 Abnormal results of thyroid function studies: Secondary | ICD-10-CM

## 2019-06-12 DIAGNOSIS — Z1231 Encounter for screening mammogram for malignant neoplasm of breast: Secondary | ICD-10-CM

## 2019-06-12 DIAGNOSIS — E785 Hyperlipidemia, unspecified: Secondary | ICD-10-CM | POA: Diagnosis not present

## 2019-06-12 DIAGNOSIS — F419 Anxiety disorder, unspecified: Secondary | ICD-10-CM

## 2019-06-12 DIAGNOSIS — Z9229 Personal history of other drug therapy: Secondary | ICD-10-CM

## 2019-06-12 DIAGNOSIS — Z124 Encounter for screening for malignant neoplasm of cervix: Secondary | ICD-10-CM | POA: Insufficient documentation

## 2019-06-12 DIAGNOSIS — G4700 Insomnia, unspecified: Secondary | ICD-10-CM

## 2019-06-12 DIAGNOSIS — Z0000 Encounter for general adult medical examination without abnormal findings: Secondary | ICD-10-CM | POA: Diagnosis not present

## 2019-06-12 LAB — VITAMIN D 25 HYDROXY (VIT D DEFICIENCY, FRACTURES): VITD: 77.74 ng/mL (ref 30.00–100.00)

## 2019-06-12 LAB — LIPID PANEL
Cholesterol: 229 mg/dL — ABNORMAL HIGH (ref 0–200)
HDL: 79.8 mg/dL (ref >39.00)
LDL Cholesterol: 129 mg/dL — ABNORMAL HIGH (ref 0–99)
NonHDL: 149.59
Total CHOL/HDL Ratio: 3
Triglycerides: 101 mg/dL (ref 0.0–149.0)
VLDL: 20.2 mg/dL (ref 0.0–40.0)

## 2019-06-12 LAB — TSH: TSH: 6.24 u[IU]/mL — ABNORMAL HIGH (ref 0.35–4.50)

## 2019-06-12 LAB — VITAMIN B12: Vitamin B-12: 946 pg/mL — ABNORMAL HIGH (ref 211–911)

## 2019-06-12 MED ORDER — ALPRAZOLAM 0.25 MG PO TABS: 0.2500 mg | ORAL_TABLET | Freq: Two times a day (BID) | ORAL | 1 refills | Status: DC | PRN

## 2019-06-12 NOTE — Patient Instructions (Addendum)
Consider shingrix (can get at pharmacy) and pnuemonia vaccine (can get at pharmacy or at our office)  Bring bp cuff to next visit at our office.   OK to experiment with decreasing losartan to 1 tab daily and monitoring bp at home. As long as numbers staying below 130 regularly at home it is ok to continue with this.   I will be in touch with you once I see bloodwork results.   Consider how you would like to proceed with shoulder treatment - we can work on therapy, try injection?

## 2019-06-12 NOTE — Progress Notes (Signed)
Donna Hawkins DOB: 12/09/1955 Encounter date: 06/12/2019  This is a 64 y.o. female who presents for complete physical   History of present illness/Additional concerns: BP has been quite good at home - highest she has seen is 140/84; usually more in lower 161-096 systolic/60-70's at home. Just higher in doc office. Does feel like the losartan is making constipation worse. Tries to be good about keeping up with fluids.   Still some palpitations, but less. Does feel short of breath on occasion. Slight cough once in awhile.  Still with insomnia - occasionally will take xanax, but not very often. Usually if she has a bad night of sleep, then she will do better the next day. Feels less "drugged" with the xanax - and is able to take this if she can't fall asleep.   Has some tenderness in right arm. No weakness. Has noted recently; more with rolling over on the right shoulder at night.   Previous paps were normal; but hasn't had one in "a few years"; not following regularly with gynecology.   Past Medical History:  Diagnosis Date  . Anxiety   . Basal cell carcinoma   . Blood in stool   . Chicken pox   . Dysphagia   . GERD (gastroesophageal reflux disease)   . High blood pressure   . HLD (hyperlipidemia)   . Mitral valve prolapse    Past Surgical History:  Procedure Laterality Date  . APPENDECTOMY  2015   Open appendectomy with right colectomy  . BREAST BIOPSY Right 2001   benign  . COLON SURGERY  2015   right hemicolectomy done at time of appendectomy  . LAPAROSCOPIC APPENDECTOMY N/A 09/25/2013   Procedure:  LAPAROSCOPIC APPENDECTOMY CONVERTED TO LAPAROSCOPIC ASSISTED RIGHT COLECTOMY;  Surgeon: Gayland Curry, MD;  Location: [facility];  Service: General;  Laterality: N/A;  . TONSILLECTOMY     No Known Allergies Current Meds  Medication Sig  . ALPRAZolam (XANAX) 0.25 MG tablet Take 1 tablet (0.25 mg total) by mouth 2 (two) times daily as needed for anxiety or sleep.  . Coenzyme  Q10 (CO Q 10 PO) Take by mouth.  . losartan (COZAAR) 25 MG tablet Take 1 tablet (25 mg total) by mouth daily.  . Multiple Vitamin (MULTIVITAMIN WITH MINERALS) TABS tablet Take 1 tablet by mouth daily.  Marland Kitchen omeprazole (PRILOSEC) 40 MG capsule Take 1 capsule (40 mg total) by mouth daily. Open capsule and sprinkle in applesauce  . [DISCONTINUED] Melatonin 3 MG TABS Take 1.5 mg by mouth at bedtime.   Social History   Tobacco Use  . Smoking status: Never Smoker  . Smokeless tobacco: Never Used  Substance Use Topics  . Alcohol use: Yes    Comment: 1 drink, watered down 4 times/week   Family History  Problem Relation Age of Onset  . Stroke Mother 96       brain hemorrhage  . Irritable bowel syndrome Mother   . COPD Father   . Ovarian cancer Maternal Grandmother   . Heart disease Paternal Grandfather 64       MI  . Hypertension Daughter   . Autoimmune disease Neg Hx   . Colon cancer Neg Hx   . Rectal cancer Neg Hx   . Stomach cancer Neg Hx   . Esophageal cancer Neg Hx      Review of Systems  Constitutional: Negative for activity change, appetite change, chills, fatigue, fever and unexpected weight change.  HENT: Positive for congestion and postnasal  drip. Negative for ear pain, hearing loss, sinus pressure, sinus pain, sore throat and trouble swallowing.   Eyes: Negative for pain and visual disturbance.  Respiratory: Positive for cough (intermittent). Negative for chest tightness, shortness of breath and wheezing.   Cardiovascular: Positive for palpitations. Negative for chest pain and leg swelling.  Gastrointestinal: Negative for abdominal pain, blood in stool, constipation, diarrhea, nausea and vomiting.  Genitourinary: Negative for difficulty urinating and menstrual problem.  Musculoskeletal: Negative for arthralgias and back pain.  Skin: Negative for rash.  Neurological: Positive for numbness (bilat feet). Negative for dizziness, weakness and headaches.  Hematological: Negative  for adenopathy. Does not bruise/bleed easily.  Psychiatric/Behavioral: Negative for sleep disturbance and suicidal ideas. The patient is not nervous/anxious.     CBC:  Lab Results  Component Value Date   WBC 8.6 12/09/2018   HGB 13.5 12/09/2018   HCT 39.5 12/09/2018   MCH 31.0 09/29/2013   MCHC 34.1 12/09/2018   RDW 13.8 12/09/2018   PLT 383.0 12/09/2018   CMP: Lab Results  Component Value Date   NA 137 12/09/2018   K 4.4 12/09/2018   CL 99 12/09/2018   CO2 28 12/09/2018   ANIONGAP 11 09/29/2013   GLUCOSE 99 12/09/2018   BUN 14 12/09/2018   CREATININE 0.70 12/09/2018   GFRAA >90 09/29/2013   CALCIUM 10.0 12/09/2018   PROT 7.5 12/09/2018   BILITOT 0.6 12/09/2018   ALKPHOS 88 12/09/2018   ALT 20 12/09/2018   AST 19 12/09/2018   LIPID: Lab Results  Component Value Date   CHOL 266 (H) 12/09/2018   TRIG 83.0 12/09/2018   HDL 78.40 12/09/2018   LDLCALC 171 (H) 12/09/2018    Objective:  BP (!) 160/102 (BP Location: Left Arm, Patient Position: Sitting, Cuff Size: Normal)   Pulse 96   Temp (!) 97.3 F (36.3 C) (Temporal)   Ht 5' 7"  (1.702 m)   Wt 131 lb 11.2 oz (59.7 kg)   BMI 20.63 kg/m   Weight: 131 lb 11.2 oz (59.7 kg)   BP Readings from Last 3 Encounters:  06/12/19 (!) 160/102  05/15/19 (!) 190/100  04/17/19 (!) 149/76   Wt Readings from Last 3 Encounters:  06/12/19 131 lb 11.2 oz (59.7 kg)  05/15/19 134 lb 6.4 oz (61 kg)  04/17/19 137 lb (62.1 kg)    Physical Exam Exam conducted with a chaperone present.  Constitutional:      General: She is not in acute distress.    Appearance: She is well-developed.  HENT:     Head: Normocephalic and atraumatic.     Right Ear: External ear normal.     Left Ear: External ear normal.     Mouth/Throat:     Pharynx: No oropharyngeal exudate.  Eyes:     Conjunctiva/sclera: Conjunctivae normal.     Pupils: Pupils are equal, round, and reactive to light.  Neck:     Thyroid: No thyromegaly.  Cardiovascular:      Rate and Rhythm: Normal rate and regular rhythm.     Pulses:          Dorsalis pedis pulses are 2+ on the right side and 2+ on the left side.       Posterior tibial pulses are 2+ on the right side and 2+ on the left side.     Heart sounds: Murmur present. Systolic murmur present with a grade of 1/6. No friction rub. No gallop.   Pulmonary:     Effort: Pulmonary effort is  normal.     Breath sounds: Normal breath sounds.  Abdominal:     General: Bowel sounds are normal. There is no distension.     Palpations: Abdomen is soft. There is no mass.     Tenderness: There is abdominal tenderness in the epigastric area. There is no guarding.     Hernia: No hernia is present.     Comments: Mild epigastric tenderness   Genitourinary:    Exam position: Supine.     Vagina: Normal.     Uterus: Normal.      Adnexa: Right adnexa normal and left adnexa normal.     Comments: There is some atrophy noted of vaginal tissue; slight erythema around urethra  Cervical os is stenosed Musculoskeletal:        General: No tenderness or deformity. Normal range of motion.     Cervical back: Normal range of motion and neck supple.     Right lower leg: No edema.     Left lower leg: No edema.     Comments: No reduced ROM of shoulders. There is some lateral and posterior tenderness to palpation right shoulder. There is some pain with supraspinatus testing, but no weakness noted. Negative Hawkins.   Feet:     Right foot:     Skin integrity: Skin integrity normal.     Left foot:     Skin integrity: Skin integrity normal.     Comments: Toes are cold, but good cap refill bilat (room is cold) Lymphadenopathy:     Cervical: No cervical adenopathy.  Skin:    General: Skin is warm and dry.     Findings: No rash.  Neurological:     Mental Status: She is alert and oriented to person, place, and time.     Motor: Motor function is intact. No weakness.     Deep Tendon Reflexes: Reflexes normal.     Reflex Scores:       Tricep reflexes are 2+ on the right side and 2+ on the left side.      Bicep reflexes are 2+ on the right side and 2+ on the left side.      Brachioradialis reflexes are 2+ on the right side and 2+ on the left side.      Patellar reflexes are 2+ on the right side and 2+ on the left side. Psychiatric:        Speech: Speech normal.        Behavior: Behavior normal.        Thought Content: Thought content normal.     Assessment/Plan: Health Maintenance Due  Topic Date Due  . PAP SMEAR-Modifier  Never done  . DEXA SCAN  Never done  . PNA vac Low Risk Adult (1 of 2 - PCV13) Never done   Health Maintenance reviewed - declined pneumonia vaccine today; would like to wait a little longer after COVID vaccine..  1. Preventative health care Mammogram ordered; pap completed today. Up to date with colonoscopy.  2. Estrogen deficiency - DG Bone Density; Future  3. Vitamin D deficiency - VITAMIN D 25 Hydroxy (Vit-D Deficiency, Fractures); Future  4. Excessive vitamin B12 intake - Vitamin B12; Future  5. History of MMR vaccination - Measles/Mumps/Rubella Immunity; Future  6. Encounter for screening mammogram for malignant neoplasm of breast - MM DIGITAL SCREENING BILATERAL; Future  7. Hyperlipidemia, unspecified hyperlipidemia type - Lipid panel; Future  8. Palpitations - TSH; Future  9. Acute pain of right shoulder Discussed considering xray;  but as she would not intervene with surgery now, can hold off on this; consider PT versus injection. Advised limiting use of arm for a couple of weeks; pain has been there on and off x 2 months. Can follow up as desired.   10. Anxiety; xanax refilled. She uses sparingly and it is effective for her. We discussed considering alternative treatment if symptoms do not continue to improve (ie ssri or return to diff beta blocker but would have to watch heart rate due to fatigue) For neuropathy in feet - start with bloodwork today; follow up pending  results.  Return in about 6 months (around 12/12/2019) for Chronic condition visit.  Micheline Rough, MD

## 2019-06-13 ENCOUNTER — Other Ambulatory Visit (INDEPENDENT_AMBULATORY_CARE_PROVIDER_SITE_OTHER): Payer: Medicare PPO

## 2019-06-13 DIAGNOSIS — R946 Abnormal results of thyroid function studies: Secondary | ICD-10-CM

## 2019-06-13 LAB — CYTOLOGY - PAP
Comment: NEGATIVE
Diagnosis: NEGATIVE
High risk HPV: NEGATIVE

## 2019-06-13 LAB — MEASLES/MUMPS/RUBELLA IMMUNITY
Mumps IgG: >300 AU/mL
Rubella: 19.2 Index
Rubeola IgG: 56.6 AU/mL

## 2019-06-13 LAB — T3, FREE: T3, Free: 3.9 pg/mL (ref 2.3–4.2)

## 2019-06-13 LAB — T4, FREE: Free T4: 0.96 ng/dL (ref 0.60–1.60)

## 2019-06-14 ENCOUNTER — Inpatient Hospital Stay: Admission: RE | Admit: 2019-06-14 | Payer: Medicare PPO | Source: Ambulatory Visit

## 2019-06-14 ENCOUNTER — Telehealth: Payer: Self-pay | Admitting: Family Medicine

## 2019-06-14 NOTE — Telephone Encounter (Signed)
Pt called, mentioned she was returning Denice Paradise Ann's call and would like a call back.

## 2019-06-15 ENCOUNTER — Ambulatory Visit
Admission: RE | Admit: 2019-06-15 | Discharge: 2019-06-15 | Disposition: A | Discharge status: Home / Self Care | Payer: Medicare PPO | Source: Ambulatory Visit | Attending: Family Medicine | Admitting: Family Medicine

## 2019-06-15 ENCOUNTER — Other Ambulatory Visit: Payer: Medicare PPO

## 2019-06-15 ENCOUNTER — Other Ambulatory Visit: Payer: Self-pay

## 2019-06-15 DIAGNOSIS — E2839 Other primary ovarian failure: Secondary | ICD-10-CM

## 2019-06-15 DIAGNOSIS — Z1231 Encounter for screening mammogram for malignant neoplasm of breast: Secondary | ICD-10-CM

## 2019-06-15 NOTE — Addendum Note (Signed)
Addended by: Lahoma Crocker A on: 06/15/2019 09:00 AM   Modules accepted: Orders

## 2019-06-15 NOTE — Telephone Encounter (Signed)
See results note. 

## 2019-06-19 ENCOUNTER — Telehealth: Payer: Self-pay | Admitting: Family Medicine

## 2019-06-19 ENCOUNTER — Other Ambulatory Visit: Payer: Self-pay | Admitting: Family Medicine

## 2019-06-19 DIAGNOSIS — R928 Other abnormal and inconclusive findings on diagnostic imaging of breast: Secondary | ICD-10-CM

## 2019-06-19 MED ORDER — LOSARTAN POTASSIUM 25 MG PO TABS: ORAL_TABLET | ORAL | 1 refills | Status: DC

## 2019-06-19 NOTE — Telephone Encounter (Signed)
Rx sent 

## 2019-06-19 NOTE — Telephone Encounter (Signed)
[facility] stated [facility] pt is out of the Losartan and the pt informed them that her PCP wants her to take 1 and 1/2 daily--[facility] is [facility] one tablet daily. They are wondering if a new prescription with the correct dosage can be sent in.   Pharmacy: [facility] [place]: [phone]

## 2019-06-19 NOTE — Telephone Encounter (Signed)
losartan (COZAAR) 25 MG tablet   [id number] [address] [place] [facility] [place] Phone:  [phone]  Fax:  [phone]     Patient needs this medication mg updated before the pharmacy can fill the Rx.  [facility] said that [facility] dosage [facility] increased but not updated there

## 2019-06-20 NOTE — Progress Notes (Signed)
Noted  

## 2019-06-28 ENCOUNTER — Other Ambulatory Visit: Payer: Self-pay | Admitting: Family Medicine

## 2019-06-28 ENCOUNTER — Ambulatory Visit
Admission: RE | Admit: 2019-06-28 | Discharge: 2019-06-28 | Disposition: A | Discharge status: Home / Self Care | Payer: Medicare PPO | Source: Ambulatory Visit | Attending: Family Medicine | Admitting: Family Medicine

## 2019-06-28 ENCOUNTER — Other Ambulatory Visit: Payer: Self-pay

## 2019-06-28 DIAGNOSIS — R928 Other abnormal and inconclusive findings on diagnostic imaging of breast: Secondary | ICD-10-CM

## 2019-07-05 ENCOUNTER — Ambulatory Visit
Admission: RE | Admit: 2019-07-05 | Discharge: 2019-07-05 | Disposition: A | Discharge status: Home / Self Care | Payer: Medicare PPO | Source: Ambulatory Visit | Attending: Family Medicine | Admitting: Family Medicine

## 2019-07-05 ENCOUNTER — Other Ambulatory Visit: Payer: Self-pay

## 2019-07-05 DIAGNOSIS — R928 Other abnormal and inconclusive findings on diagnostic imaging of breast: Secondary | ICD-10-CM

## 2019-07-05 HISTORY — PX: BREAST BIOPSY: SHX20

## 2019-08-15 ENCOUNTER — Other Ambulatory Visit: Payer: Self-pay

## 2019-08-16 ENCOUNTER — Other Ambulatory Visit (INDEPENDENT_AMBULATORY_CARE_PROVIDER_SITE_OTHER): Payer: Medicare PPO

## 2019-08-16 DIAGNOSIS — R946 Abnormal results of thyroid function studies: Secondary | ICD-10-CM

## 2019-08-16 DIAGNOSIS — E678 Other specified hyperalimentation: Secondary | ICD-10-CM

## 2019-08-16 LAB — T3, FREE: T3, Free: 3.2 pg/mL (ref 2.3–4.2)

## 2019-08-16 LAB — T4, FREE: Free T4: 0.9 ng/dL (ref 0.60–1.60)

## 2019-08-16 LAB — VITAMIN B12: Vitamin B-12: 768 pg/mL (ref 211–911)

## 2019-08-16 LAB — TSH: TSH: 4.75 u[IU]/mL — ABNORMAL HIGH (ref 0.35–4.50)

## 2019-12-09 ENCOUNTER — Other Ambulatory Visit: Payer: Self-pay | Admitting: Family Medicine

## 2019-12-13 ENCOUNTER — Other Ambulatory Visit: Payer: Self-pay

## 2019-12-13 ENCOUNTER — Encounter: Payer: Self-pay | Admitting: Family Medicine

## 2019-12-13 ENCOUNTER — Ambulatory Visit: Payer: Medicare PPO | Admitting: Family Medicine

## 2019-12-13 VITALS — BP 140/90 | HR 91 | Temp 98.1°F | Ht 67.0 in | Wt 139.5 lb

## 2019-12-13 DIAGNOSIS — M810 Age-related osteoporosis without current pathological fracture: Secondary | ICD-10-CM | POA: Diagnosis not present

## 2019-12-13 DIAGNOSIS — I10 Essential (primary) hypertension: Secondary | ICD-10-CM

## 2019-12-13 DIAGNOSIS — F419 Anxiety disorder, unspecified: Secondary | ICD-10-CM | POA: Diagnosis not present

## 2019-12-13 DIAGNOSIS — R946 Abnormal results of thyroid function studies: Secondary | ICD-10-CM | POA: Diagnosis not present

## 2019-12-13 DIAGNOSIS — Z13220 Encounter for screening for lipoid disorders: Secondary | ICD-10-CM

## 2019-12-13 MED ORDER — ESCITALOPRAM OXALATE 5 MG PO TABS: 5.0000 mg | ORAL_TABLET | Freq: Every day | ORAL | 1 refills | Status: DC

## 2019-12-13 NOTE — Progress Notes (Signed)
Donna Hawkins DOB: 07/09/1955 Encounter date: 12/13/2019  This is a 64 y.o. female who presents with Chief Complaint  Patient presents with  . Follow-up    History of present illness: She is about the same.   No specific concerns today. We did need to talk through bone density which was osteoporotic on recent imaging. She is going to [employer]. She is taking calcium with a vitamin D component and also taking vitamin K. Taking magnesium as well.   She really worries about taking fosamax due to side effects. She is still having issues with stomach/gastritis and this really worries her. She was on nexium for a little while but worried about taking it due to inhibiting absorption. Does feel like this comes and goes anyway.   Doesn't like taking the xanax for sleep. She does still have issues with sleep. Usually will sleep better evening following bad sleep.   Still very anxious, still short of breath, still with palpitations. Does feel tired.   Blood pressure running better - under 130/less than 80 at home. Although elevated today, she is always elevated at doctor office. Taking 1.5 of losartan 25mg .    No Known Allergies Current Meds  Medication Sig  . ALPRAZolam (XANAX) 0.25 MG tablet Take 1 tablet (0.25 mg total) by mouth 2 (two) times daily as needed for anxiety or sleep.  . Coenzyme Q10 (CO Q 10 PO) Take by mouth.  . losartan (COZAAR) 25 MG tablet TAKE 1.5 TABLETS BY MOUTH DAILY  . Multiple Vitamin (MULTIVITAMIN WITH MINERALS) TABS tablet Take 1 tablet by mouth daily.  Marland Kitchen omeprazole (PRILOSEC) 40 MG capsule Take 1 capsule (40 mg total) by mouth daily. Open capsule and sprinkle in applesauce  . OVER THE COUNTER MEDICATION Vitamin code Raw calcium  . OVER THE COUNTER MEDICATION My Kind Organic plant calcium    Review of Systems  Constitutional: Positive for fatigue. Negative for chills and fever.  Respiratory: Negative for cough, chest tightness, shortness of breath and  wheezing.   Cardiovascular: Positive for palpitations. Negative for chest pain and leg swelling.  Psychiatric/Behavioral: The patient is nervous/anxious.     Objective:  BP 140/90 (BP Location: Left Arm, Patient Position: Sitting, Cuff Size: Normal)   Pulse 91   Temp 98.1 F (36.7 C) (Oral)   Ht 5\' 7"  (1.702 m)   Wt 139 lb 8 oz (63.3 kg)   BMI 21.85 kg/m   Weight: 139 lb 8 oz (63.3 kg)   BP Readings from Last 3 Encounters:  12/13/19 140/90  06/12/19 (!) 160/102  05/15/19 (!) 190/100   Wt Readings from Last 3 Encounters:  12/13/19 139 lb 8 oz (63.3 kg)  06/12/19 131 lb 11.2 oz (59.7 kg)  05/15/19 134 lb 6.4 oz (61 kg)    Physical Exam Constitutional:      General: She is not in acute distress.    Appearance: She is well-developed.  Cardiovascular:     Rate and Rhythm: Normal rate and regular rhythm.     Heart sounds: Normal heart sounds. No murmur heard.  No friction rub.  Pulmonary:     Effort: Pulmonary effort is normal. No respiratory distress.     Breath sounds: Normal breath sounds. No wheezing or rales.  Musculoskeletal:     Right lower leg: No edema.     Left lower leg: No edema.  Neurological:     Mental Status: She is alert and oriented to person, place, and time.  Psychiatric:  Attention and Perception: Attention normal.        Mood and Affect: Mood is anxious.        Speech: Speech normal.        Behavior: Behavior normal.        Cognition and Memory: Cognition normal.     Assessment/Plan  1. Osteoporosis without current pathological fracture, unspecified osteoporosis type Patient is very hesitant to start any bone density treatment. She worries about stomach upset (she already has a great deal of this) and she worries about other less common side effects like jaw necrosis. She has read extensively. We discussed risks of not treating, as well as the severity of her osteoporosis. I told her we will check bloodwork and make sure no other  recommendations for her. She is doing [employer]. I discussed taking the nexium daily for at least a month (staying on this) and then perhaps retrying the fosamax. She will consider and we will continue to discuss. - VITAMIN D 25 Hydroxy (Vit-D Deficiency, Fractures); Future - PTH, Intact and Calcium; Future - PTH, Intact and Calcium - VITAMIN D 25 Hydroxy (Vit-D Deficiency, Fractures)  2. Anxiety She has a long hx of anxiety and is ready to try something to help. We discussed that many of her symptoms may be tied in with her anxiety. Discussed new medication(s) today with patient. Discussed potential side effects and patient verbalized understanding.   - escitalopram (LEXAPRO) 5 MG tablet; Take 1 tablet (5 mg total) by mouth daily.  Dispense: 90 tablet; Refill: 1  3. Abnormal thyroid function test Recheck labs today; I am hesitant to treat due to risk of worsening anxiety, palpitations so I would like to monitor only and avoid medication for hypothyroid if possible.  - TSH; Future - TSH  4. Lipid screening - Lipid panel; Future - Lipid panel  5. Hypertension, unspecified type Improved control. Her pressures are always higher here at the office. Home readings are normal currently.  - CBC with Differential/Platelet; Future - Comprehensive metabolic panel; Future - Comprehensive metabolic panel - CBC with Differential/Platelet    Return in about 1 month (around 01/13/2020) for Chronic condition visit - virtual ok.     Micheline Rough, MD

## 2019-12-13 NOTE — Patient Instructions (Signed)
Consider fosamax therapy, consider endocrinology referral.  Zoledronic Acid injection (Paget's Disease, Osteoporosis) What is this medicine? ZOLEDRONIC ACID (ZOE le dron ik AS id) lowers the amount of calcium loss from bone. It is used to treat Paget's disease and osteoporosis in women. This medicine may be used for other purposes; ask your health care provider or pharmacist if you have questions. COMMON BRAND NAME(S): Reclast, Zometa What should I tell my health care provider before I take this medicine? They need to know if you have any of these conditions:  aspirin-sensitive asthma  cancer, especially if you are receiving medicines used to treat cancer  dental disease or wear dentures  infection  kidney disease  low levels of calcium in the blood  past surgery on the parathyroid gland or intestines  receiving corticosteroids like dexamethasone or prednisone  an unusual or allergic reaction to zoledronic acid, other medicines, foods, dyes, or preservatives  pregnant or trying to get pregnant  breast-feeding How should I use this medicine? This medicine is for infusion into a vein. It is given by a health care professional in a hospital or clinic setting. Talk to your pediatrician regarding the use of this medicine in children. This medicine is not approved for use in children. Overdosage: If you think you have taken too much of this medicine contact a poison control center or emergency room at once. NOTE: This medicine is only for you. Do not share this medicine with others. What if I miss a dose? It is important not to miss your dose. Call your doctor or health care professional if you are unable to keep an appointment. What may interact with this medicine?  certain antibiotics given by injection  NSAIDs, medicines for pain and inflammation, like ibuprofen or naproxen  some diuretics like bumetanide, furosemide  teriparatide This list may not describe all possible  interactions. Give your health care provider a list of all the medicines, herbs, non-prescription drugs, or dietary supplements you use. Also tell them if you smoke, drink alcohol, or use illegal drugs. Some items may interact with your medicine. What should I watch for while using this medicine? Visit your doctor or health care professional for regular checkups. It may be some time before you see the benefit from this medicine. Do not stop taking your medicine unless your doctor tells you to. Your doctor may order blood tests or other tests to see how you are doing. Women should inform their doctor if they wish to become pregnant or think they might be pregnant. There is a potential for serious side effects to an unborn child. Talk to your health care professional or pharmacist for more information. You should make sure that you get enough calcium and vitamin D while you are taking this medicine. Discuss the foods you eat and the vitamins you take with your health care professional. Some people who take this medicine have severe bone, joint, and/or muscle pain. This medicine may also increase your risk for jaw problems or a broken thigh bone. Tell your doctor right away if you have severe pain in your jaw, bones, joints, or muscles. Tell your doctor if you have any pain that does not go away or that gets worse. Tell your dentist and dental surgeon that you are taking this medicine. You should not have major dental surgery while on this medicine. See your dentist to have a dental exam and fix any dental problems before starting this medicine. Take good care of your teeth while on  this medicine. Make sure you see your dentist for regular follow-up appointments. What side effects may I notice from receiving this medicine? Side effects that you should report to your doctor or health care professional as soon as possible:  allergic reactions like skin rash, itching or hives, swelling of the face, lips, or  tongue  anxiety, confusion, or depression  breathing problems  changes in vision  eye pain  feeling faint or lightheaded, falls  jaw pain, especially after dental work  mouth sores  muscle cramps, stiffness, or weakness  redness, blistering, peeling or loosening of the skin, including inside the mouth  trouble passing urine or change in the amount of urine Side effects that usually do not require medical attention (report to your doctor or health care professional if they continue or are bothersome):  bone, joint, or muscle pain  constipation  diarrhea  fever  hair loss  irritation at site where injected  loss of appetite  nausea, vomiting  stomach upset  trouble sleeping  trouble swallowing  weak or tired This list may not describe all possible side effects. Call your doctor for medical advice about side effects. You may report side effects to FDA at 1-800-FDA-1088. Where should I keep my medicine? This drug is given in a hospital or clinic and will not be stored at home. NOTE: This sheet is a summary. It may not cover all possible information. If you have questions about this medicine, talk to your doctor, pharmacist, or health care provider.  2020 Elsevier/Gold Standard (2013-07-08 14:19:57)

## 2019-12-14 LAB — CBC WITH DIFFERENTIAL/PLATELET
Absolute Monocytes: 766 cells/uL (ref 200–950)
Basophils Absolute: 63 cells/uL (ref 0–200)
Basophils Relative: 0.8 %
Eosinophils Absolute: 142 cells/uL (ref 15–500)
Eosinophils Relative: 1.8 %
HCT: 37.8 % (ref 35.0–45.0)
Hemoglobin: 12.8 g/dL (ref 11.7–15.5)
Lymphs Abs: 1683 cells/uL (ref 850–3900)
MCH: 31.5 pg (ref 27.0–33.0)
MCHC: 33.9 g/dL (ref 32.0–36.0)
MCV: 93.1 fL (ref 80.0–100.0)
MPV: 9.4 fL (ref 7.5–12.5)
Monocytes Relative: 9.7 %
Neutro Abs: 5246 cells/uL (ref 1500–7800)
Neutrophils Relative %: 66.4 %
Platelets: 397 10*3/uL (ref 140–400)
RBC: 4.06 10*6/uL (ref 3.80–5.10)
RDW: 12.3 % (ref 11.0–15.0)
Total Lymphocyte: 21.3 %
WBC: 7.9 10*3/uL (ref 3.8–10.8)

## 2019-12-14 LAB — LIPID PANEL
Cholesterol: 260 mg/dL — ABNORMAL HIGH (ref <200)
HDL: 88 mg/dL (ref >=50)
LDL Cholesterol (Calc): 152 mg/dL (calc) — ABNORMAL HIGH
Non-HDL Cholesterol (Calc): 172 mg/dL (calc) — ABNORMAL HIGH (ref <130)
Total CHOL/HDL Ratio: 3 (calc) (ref <5.0)
Triglycerides: 91 mg/dL (ref <150)

## 2019-12-14 LAB — COMPREHENSIVE METABOLIC PANEL
AG Ratio: 1.7 (calc) (ref 1.0–2.5)
ALT: 29 U/L (ref 6–29)
AST: 26 U/L (ref 10–35)
Albumin: 4.6 g/dL (ref 3.6–5.1)
Alkaline phosphatase (APISO): 94 U/L (ref 37–153)
BUN: 12 mg/dL (ref 7–25)
CO2: 24 mmol/L (ref 20–32)
Calcium: 9.8 mg/dL (ref 8.6–10.4)
Chloride: 103 mmol/L (ref 98–110)
Creat: 0.69 mg/dL (ref 0.50–0.99)
Globulin: 2.7 g/dL (calc) (ref 1.9–3.7)
Glucose, Bld: 96 mg/dL (ref 65–99)
Potassium: 4.3 mmol/L (ref 3.5–5.3)
Sodium: 138 mmol/L (ref 135–146)
Total Bilirubin: 0.7 mg/dL (ref 0.2–1.2)
Total Protein: 7.3 g/dL (ref 6.1–8.1)

## 2019-12-14 LAB — PTH, INTACT AND CALCIUM
Calcium: 9.8 mg/dL (ref 8.6–10.4)
PTH: 23 pg/mL (ref 14–64)

## 2019-12-14 LAB — TSH: TSH: 4.88 mIU/L — ABNORMAL HIGH (ref 0.40–4.50)

## 2019-12-14 LAB — VITAMIN D 25 HYDROXY (VIT D DEFICIENCY, FRACTURES): Vit D, 25-Hydroxy: 39 ng/mL (ref 30–100)

## 2019-12-19 ENCOUNTER — Encounter: Payer: Self-pay | Admitting: Family Medicine

## 2019-12-19 DIAGNOSIS — M810 Age-related osteoporosis without current pathological fracture: Secondary | ICD-10-CM

## 2020-01-15 ENCOUNTER — Telehealth: Payer: Medicare PPO | Admitting: Family Medicine

## 2020-02-09 DIAGNOSIS — Z20822 Contact with and (suspected) exposure to covid-19: Secondary | ICD-10-CM | POA: Diagnosis not present

## 2020-02-27 ENCOUNTER — Other Ambulatory Visit: Payer: Self-pay | Admitting: Family Medicine

## 2020-02-28 ENCOUNTER — Ambulatory Visit: Payer: Medicare PPO | Admitting: Internal Medicine

## 2020-02-28 ENCOUNTER — Other Ambulatory Visit: Payer: Self-pay

## 2020-02-28 ENCOUNTER — Encounter: Payer: Self-pay | Admitting: Internal Medicine

## 2020-02-28 VITALS — BP 148/82 | HR 114 | Ht 67.0 in | Wt 141.5 lb

## 2020-02-28 DIAGNOSIS — R000 Tachycardia, unspecified: Secondary | ICD-10-CM | POA: Diagnosis not present

## 2020-02-28 DIAGNOSIS — M810 Age-related osteoporosis without current pathological fracture: Secondary | ICD-10-CM | POA: Diagnosis not present

## 2020-02-28 DIAGNOSIS — F419 Anxiety disorder, unspecified: Secondary | ICD-10-CM | POA: Insufficient documentation

## 2020-02-28 LAB — BASIC METABOLIC PANEL
BUN: 19 mg/dL (ref 6–23)
CO2: 28 mEq/L (ref 19–32)
Calcium: 10.1 mg/dL (ref 8.4–10.5)
Chloride: 101 mEq/L (ref 96–112)
Creatinine, Ser: 0.77 mg/dL (ref 0.40–1.20)
GFR: 80.7 mL/min (ref >60.00)
Glucose, Bld: 128 mg/dL — ABNORMAL HIGH (ref 70–99)
Potassium: 4.1 mEq/L (ref 3.5–5.1)
Sodium: 137 mEq/L (ref 135–145)

## 2020-02-28 LAB — T4, FREE: Free T4: 0.73 ng/dL (ref 0.60–1.60)

## 2020-02-28 LAB — VITAMIN D 25 HYDROXY (VIT D DEFICIENCY, FRACTURES): VITD: 83.58 ng/mL (ref 30.00–100.00)

## 2020-02-28 LAB — TSH: TSH: 4.26 u[IU]/mL (ref 0.35–4.50)

## 2020-02-28 NOTE — Patient Instructions (Addendum)
-   Calcium 1200 mg daily  - Vitamin D 1000 iu daily  - Continue with Osteostrong    - I would recommend having your bone density repeated by May, 2022 at the [facility] Urine Collection   You will be collecting your urine for a 24-hour period of time.  Your timer starts with your first urine of the morning (For example - If you first pee at 9AM, your timer will start at 9AM)  Throw away your first urine of the morning  Collect your urine every time you pee for the next 24 hours STOP your urine collection 24 hours after you started the collection (For example - You would stop at 9AM the day after you started)

## 2020-02-28 NOTE — Progress Notes (Signed)
Name: Donna Hawkins  MRN/ DOB: [id number], Jul 02, 1955    Age/ Sex: 65 y.o., female    PCP: Wynn Banker, MD   Reason for Endocrinology Evaluation: Osteoporosis      Date of Initial Endocrinology Evaluation: 02/28/2020     HPI: Donna Hawkins is a 65 y.o. female with a past medical history of MVP, GERD, anxiety , S/P right colectomy. The patient presented for initial endocrinology clinic visit on 02/28/2020 for consultative assistance with her Osteoporosis .   Pt was diagnosed with osteoporosis: 05/2019 with a T-score of -3.9 at the spine   Menarche at age : 65 Menopausal at age : 59 Fracture Hx:  no Hx of HRT: yes  Was on a patch  FH of osteoporosis or hip fracture: no Prior Hx of anti-estrogenic therapy : no  Prior Hx of anti-resorptive therapy :  No    She is c/o anxiety and palpitations as well as insomnia   She is exercising ( osteo strong) , started recently taking supplements including since 06/2019   Vitamin Code 4 caps has 1600 of vitamin D and 1100 mg, alternates with My kind  3 tabs 1000 iu of vitamin D and 800 mg   Historically she was not on calcium    Denies back pain or radiation exposure.  Has occasional gastritis    HISTORY:  Past Medical History:  Past Medical History:  Diagnosis Date  . Anxiety   . Basal cell carcinoma   . Blood in stool   . Chicken pox   . Dysphagia   . GERD (gastroesophageal reflux disease)   . High blood pressure   . HLD (hyperlipidemia)   . Mitral valve prolapse    Past Surgical History:  Past Surgical History:  Procedure Laterality Date  . APPENDECTOMY  2015   Open appendectomy with right colectomy  . BREAST BIOPSY Right 2001   benign  . COLON SURGERY  2015   right hemicolectomy done at time of appendectomy  . LAPAROSCOPIC APPENDECTOMY N/A 09/25/2013   Procedure:  LAPAROSCOPIC APPENDECTOMY CONVERTED TO LAPAROSCOPIC ASSISTED RIGHT COLECTOMY;  Surgeon: Atilano Ina, MD;  Location: [facility] OR;   Service: General;  Laterality: N/A;  . TONSILLECTOMY        Social History:  reports that she has never smoked. She has never used smokeless tobacco. She reports current alcohol use. She reports that she does not use drugs.  Family History: family history includes COPD in her father; Heart disease (age of onset: 50) in her paternal grandfather; Hypertension in her daughter; Irritable bowel syndrome in her mother; Ovarian cancer in her maternal grandmother; Stroke (age of onset: 8) in her mother.   HOME MEDICATIONS: Allergies as of 02/28/2020   No Known Allergies     Medication List       Accurate as of February 28, 2020  4:15 PM. If you have any questions, ask your nurse or doctor.        STOP taking these medications   multivitamin with minerals Tabs tablet Stopped by: Scarlette Shorts, MD   omeprazole 40 MG capsule Commonly known as: PRILOSEC Stopped by: Scarlette Shorts, MD     TAKE these medications   ALPRAZolam 0.25 MG tablet Commonly known as: XANAX Take 1 tablet (0.25 mg total) by mouth 2 (two) times daily as needed for anxiety or sleep.   CO Q 10 PO Take by mouth.   escitalopram 5 MG tablet Commonly known as:  Lexapro Take 1 tablet (5 mg total) by mouth daily.   losartan 25 MG tablet Commonly known as: COZAAR TAKE 1 TABLET BY MOUTH EVERY DAY   OVER THE COUNTER MEDICATION Vitamin code Raw calcium   OVER THE COUNTER MEDICATION My Kind Organic plant calcium         REVIEW OF SYSTEMS: A comprehensive ROS was conducted with the patient and is negative except as per HPI     OBJECTIVE:  VS: BP (!) 148/82   Pulse (!) 114   Ht 5\' 7"  (1.702 m)   Wt 141 lb 8 oz (64.2 kg)   SpO2 98%   BMI 22.16 kg/m    Wt Readings from Last 3 Encounters:  02/28/20 141 lb 8 oz (64.2 kg)  12/13/19 139 lb 8 oz (63.3 kg)  06/12/19 131 lb 11.2 oz (59.7 kg)     EXAM: General: Pt appears well and is in NAD  Neck: General: Supple without adenopathy. Thyroid:  Thyroid size normal.  No goiter or nodules appreciated. No thyroid bruit.  Lungs: Clear with good BS bilat with no rales, rhonchi, or wheezes  Heart: Auscultation: RRR.  Abdomen: Normoactive bowel sounds, soft, nontender, without masses or organomegaly palpable  Extremities:  BL LE: No pretibial edema normal ROM and strength.  Skin: Hair: Texture and amount normal with gender appropriate distribution Skin Inspection: No rashes Skin Palpation: Skin temperature, texture, and thickness normal to palpation  Neuro: Cranial nerves: II - XII grossly intact  Motor: Normal strength throughout DTRs: 2+ and symmetric in UE without delay in relaxation phase  Mental Status: Judgment, insight: Intact Orientation: Oriented to time, place, and person Mood and affect: No depression, anxiety, or agitation     DATA REVIEWED: Results for SHAMARIE, SKUFCA (MRN [id number]) as of 02/28/2020 12:16  Ref. Range 12/13/2019 09:59  Sodium Latest Ref Range: 135 - 146 mmol/L 138  Potassium Latest Ref Range: 3.5 - 5.3 mmol/L 4.3  Chloride Latest Ref Range: 98 - 110 mmol/L 103  CO2 Latest Ref Range: 20 - 32 mmol/L 24  Glucose Latest Ref Range: 65 - 99 mg/dL 96  BUN Latest Ref Range: 7 - 25 mg/dL 12  Creatinine Latest Ref Range: 0.50 - 0.99 mg/dL 0.69  Calcium Latest Ref Range: 8.6 - 10.4 mg/dL 9.8  BUN/Creatinine Ratio Latest Ref Range: 6 - 22 (calc) NOT APPLICABLE  AG Ratio Latest Ref Range: 1.0 - 2.5 (calc) 1.7  AST Latest Ref Range: 10 - 35 U/L 26  ALT Latest Ref Range: 6 - 29 U/L 29  Total Protein Latest Ref Range: 6.1 - 8.1 g/dL 7.3  Total Bilirubin Latest Ref Range: 0.2 - 1.2 mg/dL 0.7  Alkaline phosphatase (APISO) Latest Ref Range: 37 - 153 U/L 94  Vitamin D, 25-Hydroxy Latest Ref Range: 30 - 100 ng/mL 39     DXA 06/15/2019  The BMD measured at AP Spine L1-L4 is 0.710 g/cm2 with a T-score of -3.9. This patient is considered osteoporotic according to [facility])  criteria.   Site Region Measured Date Measured Age YA BMD Significant CHANGE T-score AP Spine  L1-L4      06/15/2019    65.1         -3.9    0.710 g/cm2  DualFemur Neck Left  06/15/2019    65.1         -2.8    0.645 g/cm2  DualFemur Total Mean 06/15/2019    65.1         -  2.8    0.655 g/cm2  ASSESSMENT/PLAN/RECOMMENDATIONS:   1. Osteoporosis:  - Pt is NOT interested in pharmacological therapy  But would like to rule out secondary causes  - In review of her labs she has no hyperthyroidism  and no hypercalcemia, her vitamin D has been normal , she has no clinical features of cushing syndrome, will proceed with 24-hr urine collection for hypercalciuria.  - She has been on calcium and vitamin supplementation since 06/2019 as well as starting on exercise program, she would like to see if this routine will be helping with her bone health - I recommend repeating DXA scan at [facility] by May- June 2022   Recommendations Calcium 1200 mg daily  Vitamin D 1000 iu daily      2. Subclinical Hypothyroidism :  - Pt has symptoms of palpitations and anxiety which is more consistent with hyperthyroidism but her TSh is elevated.Will not treat at this time, as LT-4 replacement may exacerbate her anxiety symptoms   3. Anxiety :   - I have assured her that her thyroid is not the cause of her anxiety  - I have encouraged her to start taking Lexapro  - Given tachycardia and elevated BP will check urine metanephrines.     F/U in 4 months   Signed electronically by: Mack Guise, MD  [facility]  [address] [place], [place] [address] Phone: [phone] FAX: [phone]   CC: [name], [place] [address] Phone: [phone] Fax: [phone]   Return to Endocrinology clinic as below: No future appointments.

## 2020-02-29 LAB — PARATHYROID HORMONE, INTACT (NO CA): PTH: 20 pg/mL (ref 14–64)

## 2020-03-01 ENCOUNTER — Other Ambulatory Visit (INDEPENDENT_AMBULATORY_CARE_PROVIDER_SITE_OTHER): Payer: Medicare PPO

## 2020-03-01 ENCOUNTER — Other Ambulatory Visit: Payer: Self-pay

## 2020-03-01 DIAGNOSIS — R000 Tachycardia, unspecified: Secondary | ICD-10-CM | POA: Diagnosis not present

## 2020-03-01 DIAGNOSIS — M810 Age-related osteoporosis without current pathological fracture: Secondary | ICD-10-CM | POA: Diagnosis not present

## 2020-03-06 LAB — METANEPHRINES, URINE, 24 HOUR
METANEPHRINE: 113 mcg/24 h (ref 90–315)
METANEPHRINES, TOTAL: 359 mcg/24 h (ref 224–832)
NORMETANEPHRINE: 246 mcg/24 h (ref 122–676)
Total Volume: 2900 mL

## 2020-03-06 LAB — EXTRA URINE SPECIMEN

## 2020-03-06 LAB — CALCIUM, URINE, 24 HOUR

## 2020-03-06 LAB — CREATININE, URINE, 24 HOUR: Creatinine, 24H Ur: 0.93 g/(24.h) (ref 0.50–2.15)

## 2020-03-12 DIAGNOSIS — Z1152 Encounter for screening for COVID-19: Secondary | ICD-10-CM | POA: Diagnosis not present

## 2020-04-09 ENCOUNTER — Telehealth: Payer: Self-pay | Admitting: Family Medicine

## 2020-04-09 NOTE — Telephone Encounter (Signed)
Left message for patient to call back and schedule Medicare Annual Wellness Visit (AWV) either virtually or in office. No detailed message left   AWVI  please schedule at anytime with [facility] Nurse Health Advisor 1 or 2   This should be a 45 minute visit. 

## 2020-07-09 ENCOUNTER — Telehealth: Payer: Self-pay | Admitting: Family Medicine

## 2020-07-09 NOTE — Telephone Encounter (Signed)
Left message for patient to call back and schedule Medicare Annual Wellness Visit (AWV) either virtually or in office.   AWVI after 03/26/20 per [facility]  please schedule at anytime with [facility] Nurse Health Advisor 1 or 2   This should be a 45 minute visit.  

## 2020-07-09 NOTE — Telephone Encounter (Signed)
Pt called back stating that she does not want to schedule an AMWV at this time.

## 2020-07-11 ENCOUNTER — Telehealth: Payer: Self-pay | Admitting: Family Medicine

## 2020-07-11 NOTE — Telephone Encounter (Signed)
Spoke to patient to  schedule Medicare Annual Wellness Visit (AWV) either virtually or in office.  Patient stated she would check her schedule and call back to schedule   AWVI 03/26/20 per [facility]   please schedule at anytime with [facility] Nurse Health Advisor 1 or 2   This should be a 45 minute visit.

## 2020-07-31 ENCOUNTER — Other Ambulatory Visit: Payer: Self-pay

## 2020-07-31 ENCOUNTER — Encounter: Payer: Self-pay | Admitting: Family Medicine

## 2020-07-31 ENCOUNTER — Ambulatory Visit: Payer: Medicare PPO | Admitting: Family Medicine

## 2020-07-31 VITALS — BP 150/90 | HR 85 | Temp 98.0°F | Ht 67.0 in | Wt 130.5 lb

## 2020-07-31 DIAGNOSIS — F419 Anxiety disorder, unspecified: Secondary | ICD-10-CM

## 2020-07-31 DIAGNOSIS — R002 Palpitations: Secondary | ICD-10-CM

## 2020-07-31 DIAGNOSIS — E538 Deficiency of other specified B group vitamins: Secondary | ICD-10-CM | POA: Diagnosis not present

## 2020-07-31 DIAGNOSIS — G4700 Insomnia, unspecified: Secondary | ICD-10-CM

## 2020-07-31 DIAGNOSIS — Z1231 Encounter for screening mammogram for malignant neoplasm of breast: Secondary | ICD-10-CM | POA: Diagnosis not present

## 2020-07-31 DIAGNOSIS — R252 Cramp and spasm: Secondary | ICD-10-CM

## 2020-07-31 DIAGNOSIS — E039 Hypothyroidism, unspecified: Secondary | ICD-10-CM

## 2020-07-31 DIAGNOSIS — I341 Nonrheumatic mitral (valve) prolapse: Secondary | ICD-10-CM

## 2020-07-31 DIAGNOSIS — M810 Age-related osteoporosis without current pathological fracture: Secondary | ICD-10-CM | POA: Diagnosis not present

## 2020-07-31 DIAGNOSIS — R0602 Shortness of breath: Secondary | ICD-10-CM

## 2020-07-31 LAB — T3, FREE: T3, Free: 2.9 pg/mL (ref 2.3–4.2)

## 2020-07-31 LAB — TSH: TSH: 4.33 u[IU]/mL (ref 0.35–4.50)

## 2020-07-31 LAB — VITAMIN B12: Vitamin B-12: 647 pg/mL (ref 211–911)

## 2020-07-31 LAB — T4, FREE: Free T4: 0.75 ng/dL (ref 0.60–1.60)

## 2020-07-31 LAB — FOLATE: Folate: 16.5 ng/mL (ref >5.9)

## 2020-07-31 LAB — FERRITIN: Ferritin: 47.3 ng/mL (ref 10.0–291.0)

## 2020-07-31 LAB — IRON: Iron: 89 ug/dL (ref 42–145)

## 2020-07-31 MED ORDER — ALPRAZOLAM 0.25 MG PO TABS: 0.2500 mg | ORAL_TABLET | Freq: Two times a day (BID) | ORAL | 0 refills | Status: DC | PRN

## 2020-07-31 NOTE — Progress Notes (Signed)
Donna Hawkins DOB: 1955-04-27 Encounter date: 07/31/2020  This is a 65 y.o. female who presents with Chief Complaint  Patient presents with   Follow-up    History of present illness: Did see Dr. Kelton Pillar for osteoporosis: they did additional eval for secondary causes; suggested continuing with exercise and calcium/d with recheck dexa may-June 2022 since patient did not want medications for treatment. Lab eval was normal.   I have ordered bone density today for follow up.   Started the lexapro in march - couldn't tell much difference and felt a little more tired, weak on this. Feels more forgetful. Also has ringing in ears. Has also had looser stools.   Still feeling short of breath. Not just when exercising - sometimes even with brushing teeth has to take big breath. Heart has been skipping more for last couple of weeks. Had been through spell of not skipping too much, but doing more than it has before.   At night sometimes toes are still burning, tingling; bothers her at night more. Not bothering her during the day.   Get leg cramps when she stretches out legs.   Keep xanax on hand as emergency medication - for bad anxiety; usually at night. Usually every couple of weeks for use; only more if certain stressors (like death in family).   Typically heart rate in the higher 50's. bp at home has been in 120's/70's. She is always significantly more elevated here.   Breathing feels ok when walking. Seems to note more when she has less active moments.   No Known Allergies Current Meds  Medication Sig   ALPRAZolam (XANAX) 0.25 MG tablet Take 1 tablet (0.25 mg total) by mouth 2 (two) times daily as needed for anxiety or sleep.   Cholecalciferol (VITAMIN D3 PO) Take 2,000 Units by mouth daily.   losartan (COZAAR) 25 MG tablet TAKE 1 TABLET BY MOUTH EVERY DAY   Menaquinone-7 (VITAMIN K2 PO) Take by mouth daily.   OVER THE COUNTER MEDICATION Vitamin code Raw calcium   OVER THE  COUNTER MEDICATION My Kind Organic plant calcium    Review of Systems  Constitutional:  Positive for fatigue. Negative for chills and fever.  Respiratory:  Positive for shortness of breath. Negative for cough, chest tightness and wheezing.   Cardiovascular:  Positive for palpitations. Negative for chest pain and leg swelling.  Musculoskeletal:  Positive for myalgias (leg cramps).  Psychiatric/Behavioral:  The patient is nervous/anxious.    Objective:  BP (!) 162/98 (BP Location: Left Arm, Patient Position: Sitting, Cuff Size: Normal)   Pulse 85   Temp 98 F (36.7 C) (Oral)   Ht 5\' 7"  (1.702 m)   Wt 130 lb 8 oz (59.2 kg)   SpO2 98%   BMI 20.44 kg/m   Weight: 130 lb 8 oz (59.2 kg)   BP Readings from Last 3 Encounters:  07/31/20 (!) 162/98  02/28/20 (!) 148/82  12/13/19 140/90   Wt Readings from Last 3 Encounters:  07/31/20 130 lb 8 oz (59.2 kg)  02/28/20 141 lb 8 oz (64.2 kg)  12/13/19 139 lb 8 oz (63.3 kg)    Physical Exam Constitutional:      General: She is not in acute distress.    Appearance: She is well-developed.  Cardiovascular:     Rate and Rhythm: Normal rate and regular rhythm.     Heart sounds: Normal heart sounds. No murmur heard. No friction rub.  Pulmonary:     Effort: Pulmonary effort is normal.  No respiratory distress.     Breath sounds: Normal breath sounds. No wheezing or rales.  Musculoskeletal:     Right lower leg: No edema.     Left lower leg: No edema.  Neurological:     Mental Status: She is alert and oriented to person, place, and time.  Psychiatric:        Behavior: Behavior normal.     Assessment/Plan  1. Age-related osteoporosis without current pathological fracture - DG Bone Density; Future  2. Anxiety Some baseline anxiety; did not tolerate lexapro well. Prefers not to be on medication if possible.   3. Mitral valve prolapse Mild on echo 11/2018.   4. Encounter for screening mammogram for malignant neoplasm of breast - MM  DIGITAL SCREENING BILATERAL; Future  5. Insomnia, unspecified type Rare use of xanax for sleep if needed.  - ALPRAZolam (XANAX) 0.25 MG tablet; Take 1 tablet (0.25 mg total) by mouth 2 (two) times daily as needed for anxiety or sleep.  Dispense: 60 tablet; Refill: 0  6. B12 deficiency Will recheck B12 levels; we discussed that this can affect muscle aches/cramps/fatigue. - Vitamin B12; Future - Folate; Future - Folate - Vitamin B12  7. Palpitations Intermittent. Monitor did not reveal concerning rhythm abnormalities. She had significant bradycardia and hypotension w beta blockers in past although we discussed consideration for very low dose if needed/sx worsening with palpitations. She would like to hold off on medications if possible.   8. Hypothyroidism, unspecified type - TSH; Future - T4, free; Future - T3, free; Future - T3, free - T4, free - TSH  9. Muscle cramps - Ferritin; Future - Iron; Future - Iron - Ferritin  10. Shortness of breath May need further eval; but reassuring this seems to be more of issue at rest and when not in middle of strenuous activity. - DG Chest 2 View; Future   Return for pending labs, imaging.     Micheline Rough, MD

## 2020-08-01 ENCOUNTER — Other Ambulatory Visit: Payer: Self-pay | Admitting: Family Medicine

## 2020-08-02 ENCOUNTER — Other Ambulatory Visit: Payer: Self-pay

## 2020-08-02 ENCOUNTER — Ambulatory Visit (INDEPENDENT_AMBULATORY_CARE_PROVIDER_SITE_OTHER)
Admission: RE | Admit: 2020-08-02 | Discharge: 2020-08-02 | Disposition: A | Discharge status: Home / Self Care | Payer: Medicare PPO | Source: Ambulatory Visit | Attending: Family Medicine | Admitting: Family Medicine

## 2020-08-02 DIAGNOSIS — R0602 Shortness of breath: Secondary | ICD-10-CM | POA: Diagnosis not present

## 2020-08-07 ENCOUNTER — Other Ambulatory Visit: Payer: Self-pay | Admitting: *Deleted

## 2020-08-07 DIAGNOSIS — R0602 Shortness of breath: Secondary | ICD-10-CM

## 2020-08-20 ENCOUNTER — Encounter: Payer: Self-pay | Admitting: Family Medicine

## 2020-08-28 ENCOUNTER — Encounter: Payer: Self-pay | Admitting: Pulmonary Disease

## 2020-09-23 ENCOUNTER — Telehealth: Payer: Self-pay | Admitting: Family Medicine

## 2020-09-23 NOTE — Telephone Encounter (Signed)
Left message for patient to call back and schedule Medicare Annual Wellness Visit (AWV) either virtually or in office.   AWVI after 03/26/20 per [facility]  please schedule at anytime with [facility] Nurse Health Advisor 1 or 2   This should be a 45 minute visit.

## 2020-10-02 ENCOUNTER — Ambulatory Visit: Payer: Medicare PPO | Admitting: Pulmonary Disease

## 2020-10-02 ENCOUNTER — Encounter: Payer: Self-pay | Admitting: Pulmonary Disease

## 2020-10-02 ENCOUNTER — Other Ambulatory Visit: Payer: Self-pay

## 2020-10-02 VITALS — BP 138/86 | HR 104 | Temp 97.6°F | Ht 67.0 in | Wt 128.6 lb

## 2020-10-02 DIAGNOSIS — R0602 Shortness of breath: Secondary | ICD-10-CM

## 2020-10-02 MED ORDER — FLUTICASONE FUROATE-VILANTEROL 100-25 MCG/INH IN AEPB: 1.0000 | INHALATION_SPRAY | Freq: Every day | RESPIRATORY_TRACT | 0 refills | Status: DC

## 2020-10-02 NOTE — Patient Instructions (Signed)
Start Breo Ellipta Inhaler 1 puff daily over the next month - rinse mouth out after each use  Please call our office in 3-4 weeks to let us know if you notice any benefit in your breathing from the inhaler and we will send in refills  We will schedule you for pulmonary function tests at your convenience. If the results are abnormal we will consider checking a CT chest scan.

## 2020-10-02 NOTE — Progress Notes (Signed)
Synopsis: Referred in August 2022 for shortness of breath by Micheline Rough, MD  Subjective:   PATIENT ID: Donna Hawkins GENDER: female DOB: October 12, 1955, MRN: [id number]  HPI  Chief Complaint  Patient presents with   Consult    Referred by PCP for increasing SOB for the past 2-3 months. States this started about 2-3 years. Unable to take a deep breath. Denies any coughing.    Donna Hawkins is a 65 year old woman, never smoker with GERD and hypertension who is referred to pulmonary clinic for shortness of breath.   She reports having shortness of breath for several years.  The shortness of breath is noticeable at rest and even with exertion.  The shortness of breath has not limited her daily activities.  She denies any wheezing but has an intermittent cough that is dry which she feels she can take better breaths after coughing.  She struggles with insomnia but she feels sometimes the shortness of breath can wake her up at night.  She complains of sinus congestion that is chronic and denies any significant postnasal drainage.  She also reports trouble swallowing and has significant history of GERD/gastritis which is being treated by PPI therapy.  She also has significant anxiety that she has been managing.  Patient denies any history of trouble breathing in childhood and denies any secondhand smoke exposure.  She is a never smoker.  She denies any occupations that would have exposed her to different dust or chemicals.  She denies any hobbies with harmful exposures and she has only had dogs as pets in the past.  Past Medical History:  Diagnosis Date   Anxiety    Appendicitis 09/25/2013   Basal cell carcinoma    Blood in stool    Chicken pox    Dysphagia    GERD (gastroesophageal reflux disease)    High blood pressure    HLD (hyperlipidemia)    Mitral valve prolapse      Family History  Problem Relation Age of Onset   Stroke Mother 62       brain hemorrhage   Irritable bowel  syndrome Mother    COPD Father    Ovarian cancer Maternal Grandmother    Heart disease Paternal Grandfather 59       MI   Hypertension Daughter    Autoimmune disease Neg Hx    Colon cancer Neg Hx    Rectal cancer Neg Hx    Stomach cancer Neg Hx    Esophageal cancer Neg Hx      Social History   Socioeconomic History   Marital status: Married    Spouse name: Not on file   Number of children: 2   Years of education: Not on file   Highest education level: Not on file  Occupational History   Occupation: retired  Tobacco Use   Smoking status: Never   Smokeless tobacco: Never  Vaping Use   Vaping Use: Never used  Substance and Sexual Activity   Alcohol use: Yes    Comment: 1 drink, watered down 4 times/week   Drug use: No   Sexual activity: Not on file  Other Topics Concern   Not on file  Social History Narrative   Not on file   Social Determinants of Health   Financial Resource Strain: Not on file  Food Insecurity: Not on file  Transportation Needs: Not on file  Physical Activity: Not on file  Stress: Not on file  Social Connections: Not on  file  Intimate Partner Violence: Not on file     No Known Allergies   Outpatient Medications Prior to Visit  Medication Sig Dispense Refill   ALPRAZolam (XANAX) 0.25 MG tablet Take 1 tablet (0.25 mg total) by mouth 2 (two) times daily as needed for anxiety or sleep. 60 tablet 0   Cholecalciferol (VITAMIN D3 PO) Take 2,000 Units by mouth daily.     losartan (COZAAR) 25 MG tablet TAKE 1.5 TABLETS BY MOUTH DAILY 135 tablet 1   Menaquinone-7 (VITAMIN K2 PO) Take by mouth daily.     OVER THE COUNTER MEDICATION Vitamin code Raw calcium     OVER THE COUNTER MEDICATION My Kind Organic plant calcium     No facility-administered medications prior to visit.    Review of Systems  Constitutional:  Negative for chills, fever, malaise/fatigue and weight loss.  HENT:  Positive for congestion. Negative for sinus pain and sore throat.    Eyes: Negative.   Respiratory:  Positive for cough and shortness of breath. Negative for hemoptysis, sputum production and wheezing.   Cardiovascular:  Negative for chest pain, palpitations, orthopnea, claudication and leg swelling.  Gastrointestinal:  Positive for heartburn. Negative for abdominal pain, nausea and vomiting.  Genitourinary: Negative.   Musculoskeletal:  Positive for joint pain. Negative for myalgias.  Skin:  Negative for rash.  Neurological:  Positive for headaches. Negative for weakness.  Endo/Heme/Allergies: Negative.   Psychiatric/Behavioral:  The patient is nervous/anxious.    Objective:   Vitals:   10/02/20 0900  BP: 138/86  Pulse: (!) 104  Temp: 97.6 F (36.4 C)  TempSrc: Oral  SpO2: 97%  Weight: 128 lb 9.6 oz (58.3 kg)  Height: '5\' 7"'$  (1.702 m)   Physical Exam Constitutional:      General: She is not in acute distress.    Appearance: She is not ill-appearing.  HENT:     Head: Normocephalic and atraumatic.     Mouth/Throat:     Mouth: Mucous membranes are moist.     Pharynx: Oropharynx is clear.  Eyes:     General: No scleral icterus.    Conjunctiva/sclera: Conjunctivae normal.     Pupils: Pupils are equal, round, and reactive to light.  Cardiovascular:     Rate and Rhythm: Normal rate and regular rhythm.     Pulses: Normal pulses.     Heart sounds: Normal heart sounds. No murmur heard. Pulmonary:     Effort: Pulmonary effort is normal.     Breath sounds: Normal breath sounds. No wheezing, rhonchi or rales.  Abdominal:     General: Bowel sounds are normal.     Palpations: Abdomen is soft.  Musculoskeletal:     Right lower leg: No edema.     Left lower leg: No edema.  Lymphadenopathy:     Cervical: No cervical adenopathy.  Skin:    General: Skin is warm and dry.  Neurological:     General: No focal deficit present.     Mental Status: She is alert.  Psychiatric:        Mood and Affect: Mood normal.        Behavior: Behavior normal.         Thought Content: Thought content normal.        Judgment: Judgment normal.   CBC    Component Value Date/Time   WBC 7.9 12/13/2019 0959   RBC 4.06 12/13/2019 0959   HGB 12.8 12/13/2019 0959   HCT 37.8 12/13/2019 0959   PLT  397 12/13/2019 0959   MCV 93.1 12/13/2019 0959   MCH 31.5 12/13/2019 0959   MCHC 33.9 12/13/2019 0959   RDW 12.3 12/13/2019 0959   LYMPHSABS 1,683 12/13/2019 0959   MONOABS 0.8 12/09/2018 1410   EOSABS 142 12/13/2019 0959   BASOSABS 63 12/13/2019 0959   BMP Latest Ref Rng & Units 02/28/2020 12/13/2019 12/13/2019  Glucose 70 - 99 mg/dL 128(H) 96 -  BUN 6 - 23 mg/dL 19 12 -  Creatinine 0.40 - 1.20 mg/dL 0.77 0.69 -  BUN/Creat Ratio 6 - 22 (calc) - NOT APPLICABLE -  Sodium A999333 - 145 mEq/L 137 138 -  Potassium 3.5 - 5.1 mEq/L 4.1 4.3 -  Chloride 96 - 112 mEq/L 101 103 -  CO2 19 - 32 mEq/L 28 24 -  Calcium 8.4 - 10.5 mg/dL 10.1 9.8 9.8   Chest imaging: CXR 08/02/20 Hyperinflation and flattening of the diaphragms. Mildly coarsened reticular interstitial changes and bronchitic features are noted. Biapical pleuroparenchymal scarring. No consolidation, features of edema, pneumothorax, or effusion. The aorta is calcified. The remaining cardiomediastinal contours are unremarkable.  PFT: No flowsheet data found.  Echo 12/13/2018: LVEF 60-65%. LV normal function and size. Mild LVH. RV systolic function is normal. RA and LA size are normal. Mitral valve prolapse, mild.   Assessment & Plan:   Shortness of breath - Plan: Pulmonary function test, fluticasone furoate-vilanterol (BREO ELLIPTA) 100-25 MCG/INH AEPB  Discussion: Donna Hawkins is a 65 year old woman, never smoker with GERD and hypertension who is referred to pulmonary clinic for shortness of breath.   Her shortness of breath is possibly related to reactive airways disease given her intermittent symptoms of cough and shortness of breath.  There is also concern for hyperinflation on her recent chest  x-ray.  There is also bronchitic features and mildly coarsened reticular interstitial changes as well which could be related to chronic GERD with the possibility of interstitial lung disease.  We will trial her on Breo Ellipta for 1 month and monitor for any symptom relief.  We will also check pulmonary function tests for further evaluation.  Her pulmonary function tests are abnormal we will then follow-up with a CT chest scan.  Her dyspnea may be also related to anxiety which she is aware of and we discussed the potential need for seeking therapy.  She is to follow-up in 3 months.  Freda Jackson, MD [facility] Office: [phone]    Current Outpatient Medications:    ALPRAZolam (XANAX) 0.25 MG tablet, Take 1 tablet (0.25 mg total) by mouth 2 (two) times daily as needed for anxiety or sleep., Disp: 60 tablet, Rfl: 0   Cholecalciferol (VITAMIN D3 PO), Take 2,000 Units by mouth daily., Disp: , Rfl:    fluticasone furoate-vilanterol (BREO ELLIPTA) 100-25 MCG/INH AEPB, Inhale 1 puff into the lungs daily., Disp: 28 each, Rfl: 0   losartan (COZAAR) 25 MG tablet, TAKE 1.5 TABLETS BY MOUTH DAILY, Disp: 135 tablet, Rfl: 1   Menaquinone-7 (VITAMIN K2 PO), Take by mouth daily., Disp: , Rfl:    OVER THE COUNTER MEDICATION, Vitamin code Raw calcium, Disp: , Rfl:    OVER THE COUNTER MEDICATION, My Kind Organic plant calcium, Disp: , Rfl:

## 2020-10-03 ENCOUNTER — Ambulatory Visit (INDEPENDENT_AMBULATORY_CARE_PROVIDER_SITE_OTHER): Payer: Medicare PPO | Admitting: Pulmonary Disease

## 2020-10-03 ENCOUNTER — Encounter: Payer: Self-pay | Admitting: Pulmonary Disease

## 2020-10-03 DIAGNOSIS — L298 Other pruritus: Secondary | ICD-10-CM | POA: Diagnosis not present

## 2020-10-03 DIAGNOSIS — L578 Other skin changes due to chronic exposure to nonionizing radiation: Secondary | ICD-10-CM | POA: Diagnosis not present

## 2020-10-03 DIAGNOSIS — L814 Other melanin hyperpigmentation: Secondary | ICD-10-CM | POA: Diagnosis not present

## 2020-10-03 DIAGNOSIS — D225 Melanocytic nevi of trunk: Secondary | ICD-10-CM | POA: Diagnosis not present

## 2020-10-03 DIAGNOSIS — L853 Xerosis cutis: Secondary | ICD-10-CM | POA: Diagnosis not present

## 2020-10-03 DIAGNOSIS — R0602 Shortness of breath: Secondary | ICD-10-CM

## 2020-10-03 DIAGNOSIS — L821 Other seborrheic keratosis: Secondary | ICD-10-CM | POA: Diagnosis not present

## 2020-10-03 LAB — PULMONARY FUNCTION TEST
DL/VA % pred: 125 %
DL/VA: 5.1 ml/min/mmHg/L
DLCO cor % pred: 115 %
DLCO cor: 25.11 ml/min/mmHg
DLCO unc % pred: 115 %
DLCO unc: 25.11 ml/min/mmHg
FEF 25-75 Post: 2.25 L/sec
FEF 25-75 Pre: 2.21 L/sec
FEF2575-%Change-Post: 1 %
FEF2575-%Pred-Post: 99 %
FEF2575-%Pred-Pre: 97 %
FEV1-%Change-Post: 0 %
FEV1-%Pred-Post: 85 %
FEV1-%Pred-Pre: 84 %
FEV1-Post: 2.29 L
FEV1-Pre: 2.27 L
FEV1FVC-%Change-Post: 2 %
FEV1FVC-%Pred-Pre: 103 %
FEV6-%Change-Post: -1 %
FEV6-%Pred-Post: 83 %
FEV6-%Pred-Pre: 84 %
FEV6-Post: 2.8 L
FEV6-Pre: 2.85 L
FEV6FVC-%Pred-Post: 104 %
FEV6FVC-%Pred-Pre: 104 %
FVC-%Change-Post: -1 %
FVC-%Pred-Post: 80 %
FVC-%Pred-Pre: 81 %
FVC-Post: 2.8 L
FVC-Pre: 2.85 L
Post FEV1/FVC ratio: 82 %
Post FEV6/FVC ratio: 100 %
Pre FEV1/FVC ratio: 80 %
Pre FEV6/FVC Ratio: 100 %
RV % pred: 122 %
RV: 2.77 L
TLC % pred: 103 %
TLC: 5.69 L

## 2020-10-03 NOTE — Progress Notes (Signed)
Full PFT performed today. °

## 2020-10-03 NOTE — Patient Instructions (Signed)
Full PFT performed today. °

## 2020-10-05 ENCOUNTER — Other Ambulatory Visit: Payer: Self-pay | Admitting: Gastroenterology

## 2020-10-10 NOTE — Telephone Encounter (Signed)
Received a message from patient asking about her PFT results. She completed her PFT on 10/03/20.   JD, can you please advise on her PFT results? Thanks!

## 2020-11-11 ENCOUNTER — Telehealth: Payer: Self-pay | Admitting: Family Medicine

## 2020-11-11 NOTE — Telephone Encounter (Signed)
Spoke to patient to schedule Medicare Annual Wellness Visit (AWV) either virtually or in office. Left  my Herbie Drape number [phone]  Patient stated she will call back to schedule  AWVI after 03/26/20 per [place] please schedule at anytime with [facility] Nurse Health Advisor 1 or 2   This should be a 45 minute visit.

## 2020-11-18 ENCOUNTER — Telehealth: Payer: Self-pay

## 2020-11-18 NOTE — Telephone Encounter (Signed)
Left message asking patient to return call to [phone] to schedule AWV. Nat Christen, CMA

## 2021-01-17 ENCOUNTER — Other Ambulatory Visit: Payer: Self-pay | Admitting: Family Medicine

## 2021-01-30 ENCOUNTER — Ambulatory Visit: Payer: Medicare PPO

## 2021-01-30 ENCOUNTER — Ambulatory Visit
Admission: RE | Admit: 2021-01-30 | Discharge: 2021-01-30 | Disposition: A | Discharge status: Home / Self Care | Payer: Medicare PPO | Source: Ambulatory Visit | Attending: Family Medicine | Admitting: Family Medicine

## 2021-01-30 DIAGNOSIS — Z780 Asymptomatic menopausal state: Secondary | ICD-10-CM | POA: Diagnosis not present

## 2021-01-30 DIAGNOSIS — M810 Age-related osteoporosis without current pathological fracture: Secondary | ICD-10-CM | POA: Diagnosis not present

## 2021-02-03 ENCOUNTER — Telehealth: Payer: Self-pay | Admitting: Family Medicine

## 2021-02-03 NOTE — Telephone Encounter (Signed)
Left message for patient to call back and schedule Medicare Annual Wellness Visit (AWV) either virtually or in office. Left  my Donna Hawkins number [phone]   AWVI after 03/26/20 per [place] please schedule at anytime with [facility] Nurse Health Advisor 1 or 2   This should be a 45 minute visit.

## 2021-03-17 ENCOUNTER — Telehealth: Payer: Self-pay | Admitting: Family Medicine

## 2021-03-17 NOTE — Telephone Encounter (Signed)
Left message for patient to call back and schedule Medicare Annual Wellness Visit (AWV) either virtually or in office. Left  my Donna Hawkins number [phone]    AWVI after 03/26/20 per [place] please schedule at anytime with [facility] Nurse Health Advisor 1 or 2   This should be a 45 minute visit.

## 2021-04-23 ENCOUNTER — Telehealth: Payer: Self-pay | Admitting: Family Medicine

## 2021-04-23 NOTE — Telephone Encounter (Signed)
Left message for patient to call back and schedule Medicare Annual Wellness Visit (AWV) either virtually or in office. Left  my Herbie Drape number [phone] ? ? ?AWVI after 03/26/20 per [place] ?please schedule at anytime with [facility] Nurse Health Advisor 1 or 2 ? ? ?This should be a 45 minute visit.  ?

## 2021-06-02 ENCOUNTER — Telehealth: Payer: Self-pay | Admitting: Family Medicine

## 2021-06-02 NOTE — Telephone Encounter (Signed)
Left message for patient to call back and schedule Medicare Annual Wellness Visit (AWV) either virtually or in office. Left  my jabber number [phone] ? ?AWVI after 03/26/20 per [place]  ?please schedule at anytime with [facility] Nurse Health Advisor 1 or 2 ? ? ?This should be a 45 minute visit.  ?

## 2021-06-25 ENCOUNTER — Telehealth: Payer: Self-pay | Admitting: Family Medicine

## 2021-06-25 NOTE — Telephone Encounter (Signed)
Left message for patient to call back and schedule Medicare Annual Wellness Visit (AWV) either virtually or in office. Left  my Herbie Drape number [phone] ? ? ?; AWVI after 03/26/20 per [place] ?please schedule at anytime with [facility] Nurse Health Advisor 1 or 2 ? ? ?This should be a 45 minute visit.  ?

## 2021-06-27 DIAGNOSIS — H2513 Age-related nuclear cataract, bilateral: Secondary | ICD-10-CM | POA: Diagnosis not present

## 2021-06-27 DIAGNOSIS — H43811 Vitreous degeneration, right eye: Secondary | ICD-10-CM | POA: Diagnosis not present

## 2021-06-30 ENCOUNTER — Ambulatory Visit: Payer: Medicare PPO | Admitting: Family Medicine

## 2021-06-30 ENCOUNTER — Encounter: Payer: Self-pay | Admitting: Family Medicine

## 2021-06-30 VITALS — BP 132/88 | HR 86 | Temp 98.1°F | Ht 67.0 in | Wt 138.9 lb

## 2021-06-30 DIAGNOSIS — M810 Age-related osteoporosis without current pathological fracture: Secondary | ICD-10-CM | POA: Diagnosis not present

## 2021-06-30 DIAGNOSIS — Z13220 Encounter for screening for lipoid disorders: Secondary | ICD-10-CM | POA: Diagnosis not present

## 2021-06-30 DIAGNOSIS — G4700 Insomnia, unspecified: Secondary | ICD-10-CM

## 2021-06-30 DIAGNOSIS — L309 Dermatitis, unspecified: Secondary | ICD-10-CM

## 2021-06-30 DIAGNOSIS — R0602 Shortness of breath: Secondary | ICD-10-CM

## 2021-06-30 DIAGNOSIS — R9389 Abnormal findings on diagnostic imaging of other specified body structures: Secondary | ICD-10-CM | POA: Diagnosis not present

## 2021-06-30 MED ORDER — BETAMETHASONE VALERATE 0.1 % EX OINT: 1.0000 "application " | TOPICAL_OINTMENT | Freq: Two times a day (BID) | CUTANEOUS | 0 refills | Status: DC

## 2021-06-30 MED ORDER — LOSARTAN POTASSIUM 50 MG PO TABS: 50.0000 mg | ORAL_TABLET | Freq: Every day | ORAL | 1 refills | Status: DC

## 2021-06-30 MED ORDER — ALPRAZOLAM 0.25 MG PO TABS: 0.2500 mg | ORAL_TABLET | Freq: Two times a day (BID) | ORAL | 2 refills | Status: DC | PRN

## 2021-06-30 NOTE — Patient Instructions (Signed)
*  let me know if the betamethasone does not help with your itching. I do suggest derm follow up though if that is not better.  ? ?*set up follow up with pulmonology for after your CT.  ?

## 2021-06-30 NOTE — Progress Notes (Signed)
?Donna Hawkins ?DOB: 09-Aug-1955 ?Encounter date: 06/30/2021 ? ?This is a 66 y.o. female who presents with ?Chief Complaint  ?Patient presents with  ? Osteoporosis  ? Shortness of Breath  ?  Pablo Ledger, states she was seen by the pulmonologist, given an inhaler which did not provide relief  ? ? ?History of present illness: ?Last visit with me was 07/2020.  ? ?Did see Dr. Kelton Pillar for osteoporosis: they did additional eval for secondary causes; suggested continuing with exercise and calcium/d with recheck dexa may-June 2022 since patient did not want medications for treatment. Lab eval was normal. bone density repeated 01/2021 and was worse in all areas. Suggested follow up with Dr. Kelton Pillar; but unsure if patient received message as there were attempted calls without response. She has difficulty with swallowing and stomach issues and so knows she would have to do infusion treatment. She did do osteostrong. She is walking a mile daily; making sure to get 10,000 steps.  ? ?Still with shortness of breath. Saw pulm. She just still feels like she can't fill up her lungs. She was trialed on Breo for 1 month, but she didn't feel like this helped at all.  ? ? ?Bp at home was 116/72 today. She didn't take xanax today, but does tend to do this before other visits (like dentist). She does use this just on occasion when anxiety is more severe. Rarely will use xanax for sleep if stressed. Other meds are really too sedating for her.  ? ? ?No Known Allergies ?Current Meds  ?Medication Sig  ? ALPRAZolam (XANAX) 0.25 MG tablet Take 1 tablet (0.25 mg total) by mouth 2 (two) times daily as needed for anxiety or sleep.  ? Cholecalciferol (VITAMIN D3 PO) Take 2,000 Units by mouth daily.  ? losartan (COZAAR) 25 MG tablet TAKE 1 AND 1/2 TABLETS BY MOUTH EVERY DAY  ? Menaquinone-7 (VITAMIN K2 PO) Take by mouth daily.  ? OVER THE COUNTER MEDICATION Vitamin code Raw calcium  ? OVER THE COUNTER MEDICATION My Kind Organic plant calcium   ? ? ?Review of Systems  ?Constitutional:  Negative for chills, fatigue and fever.  ?Respiratory:  Negative for cough, chest tightness, shortness of breath and wheezing.   ?Cardiovascular:  Positive for palpitations. Negative for chest pain and leg swelling.  ?Skin:   ?     Left posterior shoulder has been itchy for a long time.  Previously, she was itching right lower back.  Location seem to have switched.  She did follow-up with dermatology and was given triamcinolone 0.1%, but this did not help at all.  She continues to itch in this area.  ?Psychiatric/Behavioral:  The patient is nervous/anxious.   ? ?Objective: ? ?BP (!) 150/100 (BP Location: Left Arm, Patient Position: Sitting, Cuff Size: Normal)   Pulse 86   Temp 98.1 ?F (36.7 ?C) (Oral)   Ht 5' 7"  (1.702 m)   Wt 138 lb 14.4 oz (63 kg)   SpO2 98%   BMI 21.75 kg/m?   Weight: 138 lb 14.4 oz (63 kg)  ? ?BP Readings from Last 3 Encounters:  ?06/30/21 (!) 150/100  ?10/02/20 138/86  ?07/31/20 (!) 150/90  ? ?Wt Readings from Last 3 Encounters:  ?06/30/21 138 lb 14.4 oz (63 kg)  ?10/02/20 128 lb 9.6 oz (58.3 kg)  ?07/31/20 130 lb 8 oz (59.2 kg)  ? ? ?Physical Exam ?Constitutional:   ?   General: She is not in acute distress. ?   Appearance: She is well-developed.  ?  Cardiovascular:  ?   Rate and Rhythm: Normal rate and regular rhythm.  ?   Heart sounds: Normal heart sounds. No murmur heard. ?  No friction rub.  ?Pulmonary:  ?   Effort: Pulmonary effort is normal. No respiratory distress.  ?   Breath sounds: Normal breath sounds. No wheezing or rales.  ?Musculoskeletal:  ?   Right lower leg: No edema.  ?   Left lower leg: No edema.  ?Skin: ?   Comments: Left posterior shoulder approximately 8 cm area of hyperpigmentation without other significant dermatitis.  ?Neurological:  ?   Mental Status: She is alert and oriented to person, place, and time.  ?Psychiatric:     ?   Behavior: Behavior normal.  ? ? ?Assessment/Plan ? ?1. Age related osteoporosis, unspecified  pathological fracture presence ?We discussed continuing with calcium in diet, vitamin D supplementation. She would like to do occupational therapy to work on the density improvement (she recently saw an ad for this through [facility] health).  She has done ostia strong in the past and bone density actually worsened on most recent study.  She is coming around to the idea of taking medication to help with bone density, but is still reluctant to start.  She worries that there is a secondary cause of her osteoporosis.  I have reached out to endocrinology, whom she met within the past to discuss, to make sure no further eval recommended.  Patient does not feel she be able to tolerate oral medication for bone density due to her issues with reflux.  Pending response from endocrinology, we can check with insurance to see what they may cover for patient. ?- Ambulatory referral to Occupational Therapy ?- VITAMIN D 25 Hydroxy (Vit-D Deficiency, Fractures); Future ? ?2. Shortness of breath ?This is been ongoing, and evaluation so far has not been very revealing.  She did see pulmonology, but did not follow through with suggested CT.  I have reached out to them today and we are going to go ahead and order high-resolution CT for further evaluation.  I have asked her to make follow-up appointment with them to review results and determine next step in her plan. ?- CT Chest High Resolution; Future ?- CBC with Differential/Platelet; Future ?- Comprehensive metabolic panel; Future ? ?3. Abnormal CXR ?See above. ? ?4. Insomnia, unspecified type ?- ALPRAZolam (XANAX) 0.25 MG tablet; Take 1 tablet (0.25 mg total) by mouth 2 (two) times daily as needed for anxiety or sleep.  Dispense: 60 tablet; Refill: 2 ? ?5. Dermatitis ?Let me know if the betamethasone is not working.  She did see dermatology for this and was given triamcinolone without relief.  Suggested follow back up with Derm if not improving. ?- betamethasone valerate ointment  (VALISONE) 0.1 %; Apply 1 application. topically 2 (two) times daily.  Dispense: 30 g; Refill: 0 ? ?6. Lipid screening ?- Lipid panel; Future ? ? ? ? ?Return for pending lab or imaging results. ?2mnutes spent in chart review, exam, charting, review of potential treatment plans, reaching out to specialist ? ? ?JMicheline Rough MD ?

## 2021-07-06 ENCOUNTER — Other Ambulatory Visit: Payer: Self-pay | Admitting: Family Medicine

## 2021-07-06 DIAGNOSIS — M810 Age-related osteoporosis without current pathological fracture: Secondary | ICD-10-CM

## 2021-07-09 DIAGNOSIS — I872 Venous insufficiency (chronic) (peripheral): Secondary | ICD-10-CM | POA: Diagnosis not present

## 2021-07-10 ENCOUNTER — Ambulatory Visit: Payer: Medicare PPO | Attending: Family Medicine | Admitting: Rehabilitative and Restorative Service Providers"

## 2021-07-10 ENCOUNTER — Encounter: Payer: Self-pay | Admitting: Rehabilitative and Restorative Service Providers"

## 2021-07-10 DIAGNOSIS — L299 Pruritus, unspecified: Secondary | ICD-10-CM | POA: Diagnosis not present

## 2021-07-10 DIAGNOSIS — R293 Abnormal posture: Secondary | ICD-10-CM | POA: Insufficient documentation

## 2021-07-10 DIAGNOSIS — I872 Venous insufficiency (chronic) (peripheral): Secondary | ICD-10-CM | POA: Diagnosis not present

## 2021-07-10 DIAGNOSIS — M6281 Muscle weakness (generalized): Secondary | ICD-10-CM | POA: Insufficient documentation

## 2021-07-10 DIAGNOSIS — I781 Nevus, non-neoplastic: Secondary | ICD-10-CM | POA: Diagnosis not present

## 2021-07-10 NOTE — Patient Instructions (Signed)
DO's and DON'T's  Avoid and/or Minimize positions of forward bending ( flexion) Side bending and rotation of the trunk Especially when movements occur together   When your back aches:  Don't sit down  Lie down on your back with a small pillow under your head and one under your knees or as outlined by our therapist. Or, lie in the 90/90 position ( on the floor with your feet and legs on the sofa with knees and hips bent to 90 degrees)  Tying or putting on your shoes:  Don't bend over to tie your shoes or put on socks. Instead, bring one foot up, cross it over the opposite knee and bend forward (hinge) at the hips to so the task.  Keep your back straight.  If you cannot do this safely, then you need to use long handled assistive devices such as a shoehorn and sock puller.  Exercising: Don't engage in ballistic types of exercise routines such as high-impact aerobics or jumping rope Don't do exercises in the gym that bring you forward (abdominal crunches, sit-ups, touching your  toes, knee-to-chest, straight leg raising.) Follow a regular exercise program that includes a variety of different weight-bearing activities, such as low-impact aerobics, T' ai chi or walking as your physical therapist advises Do exercises that emphasize return to normal body alignment and strengthening of the muscles that keep your back straight, as outlined in this program or by your therapist  Household tasks: Don't reach unnecessarily or twist your trunk when mopping, sweeping, vacuuming, raking, making beds, weeding gardens, getting objects ou of cupboards, etc. Keep your broom, mop, vacuum, or rake close to you and mover your whole body as you move them. Walk over to the area on which you are working. Arrange kitchen, bathroom, and bedroom shelves so that frequently used items may be reached without excessive bending, twisting, and reaching.  Use a sturdy stool if  necessary. Don't bend from the waist to pick up something up  Off the floor, out of the trunk of your car, or to brush your teeth, wash your face, etc.  Bend at the knees, keeping back straight as possible. Use a reacher if necessary.   Prevention of fracture is the so-called "BOTTOm -Line" in the management of OSTEOPOROSIS. Do not take unnecessary chances in movement. Once a compression fracture occurs, the process is very difficult to control; one fracture is frequently followed by many more.     RE-ALIGNMENT ROUTINE EXERCISES-OSTEOPROROSIS BASIC FOR POSTURAL CORRECTION   RE-ALIGNMENT Tips BENEFITS: 1.It helps to re-align the curves of the back and improve standing posture. 2.It allows the back muscles to rest and strengthen in preparation for more activity. FREQUENCY: Daily, even after weeks, months and years of more advanced exercises. START: 1.All exercises start in the same position: lying on the back, arms resting on the supporting surface, palms up and slightly away from the body, backs of hands down, knees bent, feet flat. 2.The head, neck, arms, and legs are supported according to specific instructions of your therapist. Copyright  VHI. All rights reserved.    1. Decompression Exercise: Basic.   Takes compression off the vertebral bodies; increases tolerance for lying on the back; helps relieve back pain   Lie on back on firm surface, knees bent, feet flat, arms turned up, out to sides (~35 degrees). Head neck and arms supported as necessary. Time _5-15__ minutes. Surface: floor     2. Shoulder Press  Strengthens upper back extensors and scapular retractors.  Press both shoulders down. Hold _2-3__ seconds. Repeat _3-5__ times. Surface: floor        3. Head Press With [place]  Strengthens neck extensors   Tuck chin SLIGHTLY toward chest, keep mouth closed. Feel weight on back of head. Increase weight by pressing head down. Hold _2-3__ seconds. Relax. Repeat  3-5___ times. Surface: floor   4. Leg Lengthener: stretches quadratus lumborum and hip flexors.  Strengthens quads and ankle dorsiflexors.

## 2021-07-10 NOTE — Therapy (Signed)
OUTPATIENT PHYSICAL THERAPY LOWER EXTREMITY EVALUATION   Patient Name: Bryanne Riquelme MRN: [id number] DOB:[birth year], 66 y.o., female Today's Date: 07/10/2021   PT End of Session - 07/10/21 1021     Visit Number 1    Date for PT Re-Evaluation 08/15/21    Authorization Type [employer]    Progress Note Due on Visit 10    PT Start Time 1015    PT Stop Time 1055    PT Time Calculation (min) 40 min    Activity Tolerance Patient tolerated treatment well    Behavior During Therapy [facility] for tasks assessed/performed             Past Medical History:  Diagnosis Date   Anxiety    Appendicitis 09/25/2013   Basal cell carcinoma    Blood in stool    Chicken pox    Dysphagia    GERD (gastroesophageal reflux disease)    High blood pressure    HLD (hyperlipidemia)    Mitral valve prolapse    Past Surgical History:  Procedure Laterality Date   APPENDECTOMY  2015   Open appendectomy with right colectomy   BREAST BIOPSY Right 2001   benign   COLON SURGERY  2015   right hemicolectomy done at time of appendectomy   LAPAROSCOPIC APPENDECTOMY N/A 09/25/2013   Procedure:  LAPAROSCOPIC APPENDECTOMY CONVERTED TO LAPAROSCOPIC ASSISTED RIGHT COLECTOMY;  Surgeon: Gayland Curry, MD;  Location: [facility];  Service: General;  Laterality: N/A;   TONSILLECTOMY     Patient Active Problem List   Diagnosis Date Noted   Age-related osteoporosis without current pathological fracture 02/28/2020   Tachycardia 02/28/2020   Anxiety 02/28/2020   Mitral valve prolapse 05/04/2019   Encounter for other preprocedural examination 04/03/2019   S/P right colectomy 09/26/2013    PCP: [name], MD  REFERRING PROVIDER: [name], MD  REFERRING DIAG: M81.0 (ICD-10-CM) - Age-related osteoporosis without current pathological fracture   THERAPY DIAG:  Muscle weakness (generalized) - Plan: PT plan of care cert/re-cert  Abnormal posture - Plan: PT plan of care cert/re-cert  ONSET  DATE: reports that she was diagnosed with Osteoporosis at 32 when she had her first bone density test  SUBJECTIVE:   SUBJECTIVE STATEMENT: Pt reports that she was initially diagnosis of Osteoporisis at the age of 11.  She started Osteo-strong at that time, but she stopped the class recently when she had the most recent bone density scan.  Pt states that she has not started the medication, as she has difficulty swallowing pills and is talking with her MD about possibly doing infusion therapy.  Pt wanted to try PT first.  PERTINENT HISTORY: Osteoporosis, HTN, Mitral Valve Prolapse, Insomnia  PAIN:  Are you having pain? No  PRECAUTIONS: None  WEIGHT BEARING RESTRICTIONS No  FALLS:  Has patient fallen in last 6 months? No  LIVING ENVIRONMENT: Lives with: lives with their spouse with daughter and her family next door Lives in: House/apartment Stairs: Yes: Internal: 14 steps; on left going up and External: 6 steps; bilateral but cannot reach both Has following equipment at home: None  OCCUPATION: retired  PLOF: Independent and Leisure: go hiking, playing and traveling with grandchildren  PATIENT GOALS:  To improve bone density.   OBJECTIVE:   DIAGNOSTIC FINDINGS:  01/30/2021 Bone Density DXA scan:  The BMD measured at AP Spine L1-L4 is 0.662 g/cm2 with a T-score of -4.3. This patient is considered osteoporotic according to [facility]) criteria.  COGNITION:  Overall cognitive status: Within functional limits for tasks assessed     SENSATION: WFL  POSTURE:  Forward head, rounded shoulders  PALPATION: No tenderness to palpation  LE ROM: WFL  LE MMT: LLE strength of grossly 4 to 4+/5 throughout, RLE is 5/5  FUNCTIONAL TESTS:  5 times sit to stand: 12.4 sec without UE use Timed up and go (TUG): 6.97 sec without assistive device Berg Balance Scale: 55/56 with some difficulty with tandem stance  GAIT: Distance walked: 200 ft Assistive device  utilized: None Level of assistance: Complete Independence Comments: pt with forward flexed posture    TODAY'S TREATMENT: 07/10/2021:  reviewed osteoporosis education and osteo exercises   PATIENT EDUCATION:  Education details: Issued HEP Person educated: Patient Education method: Theatre stage manager Education comprehension: verbalized understanding and returned demonstration   HOME EXERCISE PROGRAM: Issued Decompression Exercises:  decompression position, chin tuck, shoulder press, leg lengthening  ASSESSMENT:  CLINICAL IMPRESSION: Patient is a 66 y.o. female who was seen today for physical therapy evaluation and treatment for osteoporosis. Pts PLOF is able to go hiking and exploring with her family.  Pt presents with some difficulty with tandem stance, postural imbalance, and L hip muscle weakness.  Pt would benefit from skilled PT to address her functional impairments and educate about osteoporosis.   OBJECTIVE IMPAIRMENTS decreased balance, decreased strength, and postural dysfunction.   ACTIVITY LIMITATIONS community activity.   PERSONAL FACTORS Age and 1 comorbidity: osteoporsis  are also affecting patient's functional outcome.    REHAB POTENTIAL: Good  CLINICAL DECISION MAKING: Stable/uncomplicated  EVALUATION COMPLEXITY: Low   GOALS: Goals reviewed with patient? Yes  SHORT TERM GOALS: Target date: 07/24/2021    Pt will be independent with initial HEP. Baseline: Goal status: INITIAL  2.  Pt will verbalize understanding of dos and dont's for Osteoporosis. Baseline:  Goal status: INITIAL   LONG TERM GOALS: Target date: 08/21/2021    Pt will be independent with advanced HEP. Baseline:  Goal status: INITIAL  2.  Pt will increase left hip strength to 5/5 to allow her to go hiking with her family. Baseline:  Goal status: INITIAL  3.  Pt will be able to hold tandem stance on either side x30 sec without a loss of balance. Baseline: 8 sec Goal status:  INITIAL  4.  Pt will demonstrate proper posture and body mechanics throughout PT session to minimize strain on spine. Baseline:  Goal status: INITIAL    PLAN: PT FREQUENCY: 2x/week  PT DURATION: 6 weeks  PLANNED INTERVENTIONS: Therapeutic exercises, Therapeutic activity, Neuromuscular re-education, Balance training, Gait training, Patient/Family education, Joint mobilization, Stair training, Aquatic Therapy, Dry Needling, Electrical stimulation, Cryotherapy, Moist heat, Taping, Vasopneumatic device, Ultrasound, Ionotophoresis '4mg'$ /ml Dexamethasone, Manual therapy, and Re-evaluation  PLAN FOR NEXT SESSION: review osteo exercises and education, strengthening, core stability, balance   Juel Burrow, PT 07/10/2021, 11:22 AM   [facility] [address] [place], [place] [address] Phone # [phone] Fax [phone]

## 2021-07-16 ENCOUNTER — Ambulatory Visit: Payer: Medicare PPO

## 2021-07-16 DIAGNOSIS — R293 Abnormal posture: Secondary | ICD-10-CM

## 2021-07-16 DIAGNOSIS — M6281 Muscle weakness (generalized): Secondary | ICD-10-CM

## 2021-07-16 NOTE — Therapy (Signed)
OUTPATIENT PHYSICAL THERAPY TREATMENT   Patient Name: Donna Hawkins MRN: [id number] DOB:September 02, 1955, 66 y.o., female Today's Date: 07/16/2021   PT End of Session - 07/16/21 1647     Visit Number 2    Date for PT Re-Evaluation 08/15/21    Authorization Type [employer]    Progress Note Due on Visit 10    PT Start Time 1616    PT Stop Time 1650    PT Time Calculation (min) 34 min    Activity Tolerance Patient tolerated treatment well    Behavior During Therapy [facility] for tasks assessed/performed              Past Medical History:  Diagnosis Date   Anxiety    Appendicitis 09/25/2013   Basal cell carcinoma    Blood in stool    Chicken pox    Dysphagia    GERD (gastroesophageal reflux disease)    High blood pressure    HLD (hyperlipidemia)    Mitral valve prolapse    Past Surgical History:  Procedure Laterality Date   APPENDECTOMY  2015   Open appendectomy with right colectomy   BREAST BIOPSY Right 2001   benign   COLON SURGERY  2015   right hemicolectomy done at time of appendectomy   LAPAROSCOPIC APPENDECTOMY N/A 09/25/2013   Procedure:  LAPAROSCOPIC APPENDECTOMY CONVERTED TO LAPAROSCOPIC ASSISTED RIGHT COLECTOMY;  Surgeon: Gayland Curry, MD;  Location: [place];  Service: General;  Laterality: N/A;   TONSILLECTOMY     Patient Active Problem List   Diagnosis Date Noted   Age-related osteoporosis without current pathological fracture 02/28/2020   Tachycardia 02/28/2020   Anxiety 02/28/2020   Mitral valve prolapse 05/04/2019   Encounter for other preprocedural examination 04/03/2019   S/P right colectomy 09/26/2013    PCP: [name], MD  REFERRING PROVIDER: [name], MD  REFERRING DIAG: M81.0 (ICD-10-CM) - Age-related osteoporosis without current pathological fracture   THERAPY DIAG:  Abnormal posture  Muscle weakness (generalized)  ONSET DATE: reports that she was diagnosed with Osteoporosis at 68 when she had her first bone  density test  SUBJECTIVE:   SUBJECTIVE STATEMENT:  I'm doing decompression exercises.  I don't have any questions about the information that I received last visit.  I've done a lot of research about osteoporosis.    PERTINENT HISTORY: Osteoporosis, HTN, Mitral Valve Prolapse, Insomnia  PAIN:  Are you having pain? No  PRECAUTIONS: None  WEIGHT BEARING RESTRICTIONS No  FALLS:  Has patient fallen in last 6 months? No  LIVING ENVIRONMENT: Lives with: lives with their spouse with daughter and her family next door Lives in: House/apartment Stairs: Yes: Internal: 14 steps; on left going up and External: 6 steps; bilateral but cannot reach both Has following equipment at home: None  OCCUPATION: retired  PLOF: Independent and Leisure: go hiking, playing and traveling with grandchildren  PATIENT GOALS:  To improve bone density.   OBJECTIVE:   DIAGNOSTIC FINDINGS:  01/30/2021 Bone Density DXA scan:  The BMD measured at AP Spine L1-L4 is 0.662 g/cm2 with a T-score of -4.3. This patient is considered osteoporotic according to [facility]) criteria.  COGNITION:  Overall cognitive status: Within functional limits for tasks assessed     SENSATION: WFL  POSTURE:  Forward head, rounded shoulders  LE MMT: LLE strength of grossly 4 to 4+/5 throughout, RLE is 5/5  FUNCTIONAL TESTS:  5 times sit to stand: 12.4 sec without UE use Timed up and  go (TUG): 6.97 sec without assistive device Berg Balance Scale: 55/56 with some difficulty with tandem stance  GAIT: Distance walked: 200 ft Assistive device utilized: None Level of assistance: Complete Independence Comments: pt with forward flexed posture    TODAY'S TREATMENT: Treatment on date: 07/16/21 Review of decompression exercises: good return demo of each Supine: red theraband horizontal abduction, D2, ER x10 each Seated: red theraband horizontal abduction, D2, ER x 10 each- pt can choose position for these  at home Standing hip abduction and extension- emphasis on alignment 2x10  07/10/2021:  reviewed osteoporosis education and osteo exercises   PATIENT EDUCATION:  Education details: Decompression exercises and [email or web address] Person educated: Patient Education method: Explanation and Handouts Education comprehension: verbalized understanding and returned demonstration   HOME EXERCISE PROGRAM: Issued Decompression Exercises:  decompression position, chin tuck, shoulder press, leg lengthening Access Code: [email or web address] URL: [email or web address] Date: 07/16/2021 Prepared by: Claiborne Billings  Exercises - Supine Shoulder Horizontal Abduction with Resistance  - 2 x daily - 7 x weekly - 2 sets - 10 reps - Supine Bilateral Shoulder External Rotation with Resistance  - 2 x daily - 7 x weekly - 2 sets - 10 reps - Supine PNF D2 Flexion with Resistance  - 2 x daily - 7 x weekly - 2 sets - 10 reps - Seated Correct Posture  - 1 x daily - 7 x weekly - 3 sets - 10 reps - Standing Hip Abduction with Counter Support  - 2 x daily - 7 x weekly - 2 sets - 10 reps - Standing Hip Extension with Counter Support  - 2 x daily - 7 x weekly - 2 sets - 10 reps  ASSESSMENT:  CLINICAL IMPRESSION: First time follow-up after evaluation.  Session spent reviewing decompression exercises and adding to HEP for postural strength and weight bearing exercise. Pt required intermittent and minor tactile and verbal cues for technique.  PT educated pt on neutral seated posture and importance of keeping legs at 90/90 with feet on the ground. Pt would benefit from skilled PT to address her functional impairments and educate about osteoporosis. 1-2 more sessions probable as needed.    OBJECTIVE IMPAIRMENTS decreased balance, decreased strength, and postural dysfunction.   ACTIVITY LIMITATIONS community activity.   PERSONAL FACTORS Age and 1 comorbidity: osteoporsis  are also affecting patient's functional outcome.    GOALS: Goals  reviewed with patient? Yes  SHORT TERM GOALS: Target date: 07/24/2021    Pt will be independent with initial HEP. Baseline: Goal status: INITIAL  2.  Pt will verbalize understanding of dos and dont's for Osteoporosis. Baseline:  Goal status: Met (07/16/21)   LONG TERM GOALS: Target date: 08/21/2021    Pt will be independent with advanced HEP. Baseline:  Goal status: INITIAL  2.  Pt will increase left hip strength to 5/5 to allow her to go hiking with her family. Baseline:  Goal status: INITIAL  3.  Pt will be able to hold tandem stance on either side x30 sec without a loss of balance. Baseline: 8 sec Goal status: INITIAL  4.  Pt will demonstrate proper posture and body mechanics throughout PT session to minimize strain on spine. Baseline:  Goal status: INITIAL    PLAN: PT FREQUENCY: 2x/week  PT DURATION: 6 weeks  PLANNED INTERVENTIONS: Therapeutic exercises, Therapeutic activity, Neuromuscular re-education, Balance training, Gait training, Patient/Family education, Joint mobilization, Stair training, Aquatic Therapy, Dry Needling, Electrical stimulation, Cryotherapy, Moist heat, Taping, Vasopneumatic device, Ultrasound, Ionotophoresis 37m/ml Dexamethasone, Manual therapy, and Re-evaluation  PLAN FOR NEXT SESSION: review new HEP, add balance exercises and exercises that pt can do at home with her 5# weights    Sigurd Sos, PT 07/16/21 4:52 PM  [facility] [address] [place], [place] [address] Phone # [phone] Fax [phone]   [facility] [address], [address] [place], [place] [address] Phone # [phone] Fax [phone]

## 2021-07-22 ENCOUNTER — Ambulatory Visit: Payer: Medicare PPO | Admitting: Rehabilitative and Restorative Service Providers"

## 2021-07-22 ENCOUNTER — Encounter: Payer: Self-pay | Admitting: Rehabilitative and Restorative Service Providers"

## 2021-07-22 DIAGNOSIS — R293 Abnormal posture: Secondary | ICD-10-CM

## 2021-07-22 DIAGNOSIS — M6281 Muscle weakness (generalized): Secondary | ICD-10-CM | POA: Diagnosis not present

## 2021-07-22 NOTE — Therapy (Signed)
OUTPATIENT PHYSICAL THERAPY TREATMENT   Patient Name: Donna Hawkins MRN: [id number] DOB:14-Apr-1955, 66 y.o., female Today's Date: 07/22/2021   PT End of Session - 07/22/21 1445     Visit Number 3    Date for PT Re-Evaluation 08/15/21    Authorization Type [employer]    Progress Note Due on Visit 10    PT Start Time 1445    PT Stop Time 1525    PT Time Calculation (min) 40 min    Activity Tolerance Patient tolerated treatment well    Behavior During Therapy [facility] for tasks assessed/performed              Past Medical History:  Diagnosis Date   Anxiety    Appendicitis 09/25/2013   Basal cell carcinoma    Blood in stool    Chicken pox    Dysphagia    GERD (gastroesophageal reflux disease)    High blood pressure    HLD (hyperlipidemia)    Mitral valve prolapse    Past Surgical History:  Procedure Laterality Date   APPENDECTOMY  2015   Open appendectomy with right colectomy   BREAST BIOPSY Right 2001   benign   COLON SURGERY  2015   right hemicolectomy done at time of appendectomy   LAPAROSCOPIC APPENDECTOMY N/A 09/25/2013   Procedure:  LAPAROSCOPIC APPENDECTOMY CONVERTED TO LAPAROSCOPIC ASSISTED RIGHT COLECTOMY;  Surgeon: Gayland Curry, MD;  Location: [place];  Service: General;  Laterality: N/A;   TONSILLECTOMY     Patient Active Problem List   Diagnosis Date Noted   Age-related osteoporosis without current pathological fracture 02/28/2020   Tachycardia 02/28/2020   Anxiety 02/28/2020   Mitral valve prolapse 05/04/2019   Encounter for other preprocedural examination 04/03/2019   S/P right colectomy 09/26/2013    PCP: Donna Macadam, MD  REFERRING PROVIDER: [name], MD  REFERRING DIAG: M81.0 (ICD-10-CM) - Age-related osteoporosis without current pathological fracture   THERAPY DIAG:  Abnormal posture  Muscle weakness (generalized)  ONSET DATE: reports that she was diagnosed with Osteoporosis at 50 when she had her first bone  density test  SUBJECTIVE:   SUBJECTIVE STATEMENT:  I'm doing decompression exercises.  I don't have any questions about the information that I received last visit.  I've done a lot of research about osteoporosis.    PERTINENT HISTORY: Osteoporosis, HTN, Mitral Valve Prolapse, Insomnia  PAIN:  Are you having pain? No  PRECAUTIONS: None  WEIGHT BEARING RESTRICTIONS No  FALLS:  Has patient fallen in last 6 months? No  LIVING ENVIRONMENT: Lives with: lives with their spouse with daughter and her family next door Lives in: House/apartment Stairs: Yes: Internal: 14 steps; on left going up and External: 6 steps; bilateral but cannot reach both Has following equipment at home: None  OCCUPATION: retired  PLOF: Independent and Leisure: go hiking, playing and traveling with grandchildren  PATIENT GOALS:  To improve bone density.   OBJECTIVE:   DIAGNOSTIC FINDINGS:  01/30/2021 Bone Density DXA scan:  The BMD measured at AP Spine L1-L4 is 0.662 g/cm2 with a T-score of -4.3. This patient is considered osteoporotic according to [facility]) criteria.  COGNITION:  Overall cognitive status: Within functional limits for tasks assessed     SENSATION: WFL  POSTURE:  Forward head, rounded shoulders  LE MMT: LLE strength of grossly 4 to 4+/5 throughout, RLE is 5/5  FUNCTIONAL TESTS:  5 times sit to stand: 12.4 sec without UE use Timed up and  go (TUG): 6.97 sec without assistive device Berg Balance Scale: 55/56 with some difficulty with tandem stance  GAIT: Distance walked: 200 ft Assistive device utilized: None Level of assistance: Complete Independence Comments: pt with forward flexed posture    TODAY'S TREATMENT:  07/22/2021: Nustep level 5 x6 min with PT present to discuss status Sit to/from stand holding 5# dumbbell Ambulation with holding 5# dumbbell in each hand x300 ft Supine chest press with 5# dumbbell 2x10 Supine: red theraband horizontal  abduction, D2, ER 2x10 each Supine:  chin tuck, shoulder press, arm press, leg lengthening.  x10 each Standing:  heel raises, hip abduction, hip extension.  BLE 2x10 with emphasis on alignment Tandem gait 3x10 ft with occasional UE support of counter SLS with alt hands tapping cone on counter x10 each leg  07/16/21 Review of decompression exercises: good return demo of each Supine: red theraband horizontal abduction, D2, ER x10 each Seated: red theraband horizontal abduction, D2, ER x 10 each- pt can choose position for these at home Standing hip abduction and extension- emphasis on alignment 2x10  07/10/2021:  reviewed osteoporosis education and osteo exercises   PATIENT EDUCATION:  Education details: Decompression exercises and [email or web address] Person educated: Patient Education method: Explanation and Handouts Education comprehension: verbalized understanding and returned demonstration   HOME EXERCISE PROGRAM: Issued Decompression Exercises:  decompression position, chin tuck, shoulder press, leg lengthening Access Code: [email or web address] URL: [email or web address] Date: 07/16/2021 Prepared by: Claiborne Billings  Exercises - Supine Shoulder Horizontal Abduction with Resistance  - 2 x daily - 7 x weekly - 2 sets - 10 reps - Supine Bilateral Shoulder External Rotation with Resistance  - 2 x daily - 7 x weekly - 2 sets - 10 reps - Supine PNF D2 Flexion with Resistance  - 2 x daily - 7 x weekly - 2 sets - 10 reps - Seated Correct Posture  - 1 x daily - 7 x weekly - 3 sets - 10 reps - Standing Hip Abduction with Counter Support  - 2 x daily - 7 x weekly - 2 sets - 10 reps - Standing Hip Extension with Counter Support  - 2 x daily - 7 x weekly - 2 sets - 10 reps  ASSESSMENT:  CLINICAL IMPRESSION: Ms Donna Hawkins presents to PT for skilled rehabilitation reporting that she has been trying to focus on improved posture and 90-90 when sitting.  Pt tolerates session well and able to incorporate use of 5#  dumbbell, as pt has at home.  Pt required cuing for posture during ambulation holding 5# weights secondary to arm fatigue.  Pt continues to require skilled PT to progress towards goal related activities.   OBJECTIVE IMPAIRMENTS decreased balance, decreased strength, and postural dysfunction.   ACTIVITY LIMITATIONS community activity.   PERSONAL FACTORS Age and 1 comorbidity: osteoporosis  are also affecting patient's functional outcome.    GOALS: Goals reviewed with patient? Yes  SHORT TERM GOALS: Target date: 07/24/2021    Pt will be independent with initial HEP. Baseline: Goal status: MET (07/22/2021)  2.  Pt will verbalize understanding of dos and dont's for Osteoporosis. Baseline:  Goal status: Met (07/16/21)   LONG TERM GOALS: Target date: 08/21/2021    Pt will be independent with advanced HEP. Baseline:  Goal status: INITIAL  2.  Pt will increase left hip strength to 5/5 to allow her to go hiking with her family. Baseline:  Goal status: INITIAL  3.  Pt will be able to hold tandem stance on either side x30  sec without a loss of balance. Baseline: 8 sec Goal status: INITIAL  4.  Pt will demonstrate proper posture and body mechanics throughout PT session to minimize strain on spine. Baseline:  Goal status: INITIAL    PLAN: PT FREQUENCY: 2x/week  PT DURATION: 6 weeks  PLANNED INTERVENTIONS: Therapeutic exercises, Therapeutic activity, Neuromuscular re-education, Balance training, Gait training, Patient/Family education, Joint mobilization, Stair training, Aquatic Therapy, Dry Needling, Electrical stimulation, Cryotherapy, Moist heat, Taping, Vasopneumatic device, Ultrasound, Ionotophoresis 76m/ml Dexamethasone, Manual therapy, and Re-evaluation  PLAN FOR NEXT SESSION: review new HEP, add balance exercises and exercises that pt can do at home with her 5# weights    SJuel Burrow PT 07/22/21 3:30 PM  [place] [place] [address]Phone # [phone] [phone]  [facility] [address] [place] [address]Phone # [phone] [phone]

## 2021-07-23 ENCOUNTER — Telehealth: Payer: Self-pay | Admitting: *Deleted

## 2021-07-23 NOTE — Telephone Encounter (Signed)
Taron called from [place] [phone]) regarding the order for a CT chest without contrast previously ordered by Dr Shelby Mattocks a prior authorization is needed ASAP as the patient is scheduled for tomorrow, otherwise the test will be cancelled.  Message sent to [facility].

## 2021-07-24 ENCOUNTER — Inpatient Hospital Stay: Admission: RE | Admit: 2021-07-24 | Payer: Medicare PPO | Source: Ambulatory Visit

## 2021-07-24 NOTE — Telephone Encounter (Signed)
Spoke with Drusilla Kanner and informed her of prior authorization information.

## 2021-07-30 ENCOUNTER — Encounter: Payer: Self-pay | Admitting: Rehabilitative and Restorative Service Providers"

## 2021-07-30 ENCOUNTER — Ambulatory Visit: Payer: Medicare PPO | Attending: Family Medicine | Admitting: Rehabilitative and Restorative Service Providers"

## 2021-07-30 DIAGNOSIS — R293 Abnormal posture: Secondary | ICD-10-CM | POA: Insufficient documentation

## 2021-07-30 DIAGNOSIS — M6281 Muscle weakness (generalized): Secondary | ICD-10-CM | POA: Insufficient documentation

## 2021-07-30 NOTE — Therapy (Signed)
OUTPATIENT PHYSICAL THERAPY TREATMENT   Patient Name: Donna Hawkins MRN: [id number] DOB:Jun 10, 1955, 66 y.o., female Today's Date: 07/30/2021   PT End of Session - 07/30/21 0935     Visit Number 4    Date for PT Re-Evaluation 08/15/21    Authorization Type [employer]    Progress Note Due on Visit 10    PT Start Time 0930    PT Stop Time 1010    PT Time Calculation (min) 40 min    Activity Tolerance Patient tolerated treatment well    Behavior During Therapy [facility] for tasks assessed/performed              Past Medical History:  Diagnosis Date   Anxiety    Appendicitis 09/25/2013   Basal cell carcinoma    Blood in stool    Chicken pox    Dysphagia    GERD (gastroesophageal reflux disease)    High blood pressure    HLD (hyperlipidemia)    Mitral valve prolapse    Past Surgical History:  Procedure Laterality Date   APPENDECTOMY  2015   Open appendectomy with right colectomy   BREAST BIOPSY Right 2001   benign   COLON SURGERY  2015   right hemicolectomy done at time of appendectomy   LAPAROSCOPIC APPENDECTOMY N/A 09/25/2013   Procedure:  LAPAROSCOPIC APPENDECTOMY CONVERTED TO LAPAROSCOPIC ASSISTED RIGHT COLECTOMY;  Surgeon: Gayland Curry, MD;  Location: [place];  Service: General;  Laterality: N/A;   TONSILLECTOMY     Patient Active Problem List   Diagnosis Date Noted   Age-related osteoporosis without current pathological fracture 02/28/2020   Tachycardia 02/28/2020   Anxiety 02/28/2020   Mitral valve prolapse 05/04/2019   Encounter for other preprocedural examination 04/03/2019   S/P right colectomy 09/26/2013    PCP: Donna Macadam, MD  REFERRING PROVIDER: [name], MD  REFERRING DIAG: M81.0 (ICD-10-CM) - Age-related osteoporosis without current pathological fracture   THERAPY DIAG:  Abnormal posture  Muscle weakness (generalized)  ONSET DATE: reports that she was diagnosed with Osteoporosis at 75 when she had her first bone  density test  SUBJECTIVE:   SUBJECTIVE STATEMENT:  Pt reports overall feeling okay and continuing her HEP.  States that yesterday she had some thoracic pain of 5/10 yesterday, but is improved today.  PERTINENT HISTORY: Osteoporosis, HTN, Mitral Valve Prolapse, Insomnia  PAIN:  Are you having pain? No and Yes: NPRS scale: 2/10 Pain location: thoracic Pain description: aching Aggravating factors: unsure Relieving factors: lying on the floor  PRECAUTIONS: None  WEIGHT BEARING RESTRICTIONS No  FALLS:  Has patient fallen in last 6 months? No  LIVING ENVIRONMENT: Lives with: lives with their spouse with daughter and her family next door Lives in: House/apartment Stairs: Yes: Internal: 14 steps; on left going up and External: 6 steps; bilateral but cannot reach both Has following equipment at home: None  OCCUPATION: retired  PLOF: Independent and Leisure: go hiking, playing and traveling with grandchildren  PATIENT GOALS:  To improve bone density.   OBJECTIVE:   DIAGNOSTIC FINDINGS:  01/30/2021 Bone Density DXA scan:  The BMD measured at AP Spine L1-L4 is 0.662 g/cm2 with a T-score of -4.3. This patient is considered osteoporotic according to [employer] [facility]) criteria.  COGNITION:  Overall cognitive status: Within functional limits for tasks assessed     SENSATION: WFL  POSTURE:  Forward head, rounded shoulders  LE MMT: LLE strength of grossly 4 to 4+/5 throughout, RLE is 5/5  FUNCTIONAL TESTS:  5 times sit to stand: 12.4 sec without UE use Timed up and go (TUG): 6.97 sec without assistive device Berg Balance Scale: 55/56 with some difficulty with tandem stance  GAIT: Distance walked: 200 ft Assistive device utilized: None Level of assistance: Complete Independence Comments: pt with forward flexed posture    TODAY'S TREATMENT:  07/30/2021: Nustep level 5 x6 min with PT present to discuss status Supine:  chin tuck, shoulder press  2x10  each Resisted backwards gait with 10# 2x5 Standing rows, shoulder extension, shoulder ER, and shoulder horizontal abduction with red tband 2x10 bilat Shoulder rolls x10 each direction Trigger Point Dry-Needling  Treatment instructions: Expect mild to moderate muscle soreness. S/S of pneumothorax if dry needled over a lung field, and to seek immediate medical attention should they occur. Patient verbalized understanding of these instructions and education. Patient Consent Given: Yes Education handout provided: Yes Muscles treated: right side thoracic multifidi Treatment response/outcome: Utilized skilled palpation to identify trigger points.  During dry needling able to palpate muscle twitch and muscle elongation   07/22/2021: Nustep level 5 x6 min with PT present to discuss status Sit to/from stand holding 5# dumbbell Ambulation with holding 5# dumbbell in each hand x300 ft Supine chest press with 5# dumbbell 2x10 Supine: red theraband horizontal abduction, D2, ER 2x10 each Supine:  chin tuck, shoulder press, arm press, leg lengthening.  x10 each Standing:  heel raises, hip abduction, hip extension.  BLE 2x10 with emphasis on alignment Tandem gait 3x10 ft with occasional UE support of counter SLS with alt hands tapping cone on counter x10 each leg  07/16/21 Review of decompression exercises: good return demo of each Supine: red theraband horizontal abduction, D2, ER x10 each Seated: red theraband horizontal abduction, D2, ER x 10 each- pt can choose position for these at home Standing hip abduction and extension- emphasis on alignment 2x10   PATIENT EDUCATION:  Education details: Decompression exercises and [email or web address] Person educated: Patient Education method: Explanation and Handouts Education comprehension: verbalized understanding and returned demonstration   HOME EXERCISE PROGRAM: Issued Decompression Exercises:  decompression position, chin tuck, shoulder press, leg  lengthening Access Code: [email or web address] URL: [email or web address] Date: 07/16/2021 Prepared by: Claiborne Billings  Exercises - Supine Shoulder Horizontal Abduction with Resistance  - 2 x daily - 7 x weekly - 2 sets - 10 reps - Supine Bilateral Shoulder External Rotation with Resistance  - 2 x daily - 7 x weekly - 2 sets - 10 reps - Supine PNF D2 Flexion with Resistance  - 2 x daily - 7 x weekly - 2 sets - 10 reps - Seated Correct Posture  - 1 x daily - 7 x weekly - 3 sets - 10 reps - Standing Hip Abduction with Counter Support  - 2 x daily - 7 x weekly - 2 sets - 10 reps - Standing Hip Extension with Counter Support  - 2 x daily - 7 x weekly - 2 sets - 10 reps  ASSESSMENT:  CLINICAL IMPRESSION: Donna Hawkins presents to PT for skilled rehabilitation reporting that she has been having some increased thoracic pain after completing her exercises at home.  During session, provided cuing on technique and to decrease intensity and only perform in a partial range instead of complete range.  Pt able to return demonstration and will assess next visit if this has helped decreased thoracic pain.  Pt agreeable to trying dry needling of thoracic region and following, pt did report decreased feelings of a muscle spasm.  Pt will be reassessed next visit and discharged if goals met.   OBJECTIVE IMPAIRMENTS decreased balance, decreased strength, and postural dysfunction.   ACTIVITY LIMITATIONS community activity.   PERSONAL FACTORS Age and 1 comorbidity: osteoporosis  are also affecting patient's functional outcome.    GOALS: Goals reviewed with patient? Yes  SHORT TERM GOALS: Target date: 07/24/2021    Pt will be independent with initial HEP. Baseline: Goal status: MET (07/22/2021)  2.  Pt will verbalize understanding of dos and dont's for Osteoporosis. Baseline:  Goal status: Met (07/16/21)   LONG TERM GOALS: Target date: 08/21/2021    Pt will be independent with advanced HEP. Baseline:  Goal  status: IN PROGRESS  2.  Pt will increase left hip strength to 5/5 to allow her to go hiking with her family. Baseline:  Goal status: IN PROGRESS  3.  Pt will be able to hold tandem stance on either side x30 sec without a loss of balance. Baseline: 8 sec Goal status: IN PROGRESS  4.  Pt will demonstrate proper posture and body mechanics throughout PT session to minimize strain on spine. Baseline:  Goal status: IN PROGRESS provided min cuing during 07/30/21 visit.    PLAN: PT FREQUENCY: 2x/week  PT DURATION: 6 weeks  PLANNED INTERVENTIONS: Therapeutic exercises, Therapeutic activity, Neuromuscular re-education, Balance training, Gait training, Patient/Family education, Joint mobilization, Stair training, Aquatic Therapy, Dry Needling, Electrical stimulation, Cryotherapy, Moist heat, Taping, Vasopneumatic device, Ultrasound, Ionotophoresis 59m/ml Dexamethasone, Manual therapy, and Re-evaluation  PLAN FOR NEXT SESSION: review new HEP, add balance exercises and exercises that pt can do at home with her 5# weights    SJuel Burrow PT 07/30/21 10:15 AM   [place] [place] [address]Phone # [phone] [phone]

## 2021-07-30 NOTE — Patient Instructions (Signed)
Trigger Point Dry Needling ? What is Trigger Point Dry Needling (DN)? o DN is a physical therapy technique used to treat muscle pain and dysfunction.  Specifically, DN helps deactivate muscle trigger points (muscle knots).  o A thin filiform needle is used to penetrate the skin and stimulate the underlying  trigger point. The goal is for a local twitch response (LTR) to occur and for the  trigger point to relax. No medication of any kind is injected during the procedure.  ? What Does Trigger Point Dry Needling Feel Like?  o The procedure feels different for each individual patient. Some patients report  that they do not actually feel the needle enter the skin and overall the process is  not painful. Very mild bleeding may occur. However, many patients feel a deep  cramping in the muscle in which the needle was inserted. This is the local twitch  response.  ? How Will I feel after the treatment? o Soreness is normal, and the onset of soreness may not occur for a few hours.  Typically this soreness does not last longer than two days.  o Bruising is uncommon, however; ice can be used to decrease any possible  bruising.  o In rare cases feeling tired or nauseous after the treatment is normal. In addition,  your symptoms may get worse before they get better, this period will typically not  last longer than 24 hours.  ? What Can I do After My Treatment? o Increase your hydration by drinking more water for the next 24 hours. o You may place ice or heat on the areas treated that have become sore, however,  do not use heat on inflamed or bruised areas. Heat often brings more relief post  needling. o You can continue your regular activities, but vigorous activity is not  recommended initially after the treatment for 24 hours. DN is best combined with other physical therapy such as strengthening, stretching, and other  therapies.  [facility] [address], [address] [place], [place] [address] Phone # [phone] Fax [phone]  

## 2021-08-04 ENCOUNTER — Ambulatory Visit
Admission: RE | Admit: 2021-08-04 | Discharge: 2021-08-04 | Disposition: A | Discharge status: Home / Self Care | Payer: Medicare PPO | Source: Ambulatory Visit | Attending: Family Medicine | Admitting: Family Medicine

## 2021-08-04 DIAGNOSIS — R0602 Shortness of breath: Secondary | ICD-10-CM

## 2021-08-05 ENCOUNTER — Ambulatory Visit: Payer: Medicare PPO

## 2021-08-05 DIAGNOSIS — R293 Abnormal posture: Secondary | ICD-10-CM | POA: Diagnosis not present

## 2021-08-05 DIAGNOSIS — M6281 Muscle weakness (generalized): Secondary | ICD-10-CM

## 2021-08-05 NOTE — Therapy (Signed)
OUTPATIENT PHYSICAL THERAPY TREATMENT   Patient Name: Donna Hawkins MRN: [id number] DOB:Jul 10, 1955, 66 y.o., female Today's Date: 08/05/2021   PT End of Session - 08/05/21 1011     Visit Number 5    Authorization Type [employer]    PT Start Time [phone]    PT Stop Time 1010    PT Time Calculation (min) 39 min    Activity Tolerance Patient tolerated treatment well    Behavior During Therapy [facility] for tasks assessed/performed               Past Medical History:  Diagnosis Date   Anxiety    Appendicitis 09/25/2013   Basal cell carcinoma    Blood in stool    Chicken pox    Dysphagia    GERD (gastroesophageal reflux disease)    High blood pressure    HLD (hyperlipidemia)    Mitral valve prolapse    Past Surgical History:  Procedure Laterality Date   APPENDECTOMY  2015   Open appendectomy with right colectomy   BREAST BIOPSY Right 2001   benign   COLON SURGERY  2015   right hemicolectomy done at time of appendectomy   LAPAROSCOPIC APPENDECTOMY N/A 09/25/2013   Procedure:  LAPAROSCOPIC APPENDECTOMY CONVERTED TO LAPAROSCOPIC ASSISTED RIGHT COLECTOMY;  Surgeon: Gayland Curry, MD;  Location: [place];  Service: General;  Laterality: N/A;   TONSILLECTOMY     Patient Active Problem List   Diagnosis Date Noted   Age-related osteoporosis without current pathological fracture 02/28/2020   Tachycardia 02/28/2020   Anxiety 02/28/2020   Mitral valve prolapse 05/04/2019   Encounter for other preprocedural examination 04/03/2019   S/P right colectomy 09/26/2013    PCP: [name], MD  REFERRING PROVIDER: [name], MD  REFERRING DIAG: M81.0 (ICD-10-CM) - Age-related osteoporosis without current pathological fracture   THERAPY DIAG:  Abnormal posture  Muscle weakness (generalized)  ONSET DATE: reports that she was diagnosed with Osteoporosis at 16 when she had her first bone density test  SUBJECTIVE:   SUBJECTIVE STATEMENT:  I am still getting  sore/tense in my mid back with exercises.  I am ready to D/C today  PERTINENT HISTORY: Osteoporosis, HTN, Mitral Valve Prolapse, Insomnia  PAIN:  Are you having pain? No and Yes: NPRS scale: 2/10 Pain location: thoracic Pain description: aching Aggravating factors: unsure Relieving factors: lying on the floor  PRECAUTIONS: None  WEIGHT BEARING RESTRICTIONS No  FALLS:  Has patient fallen in last 6 months? No  LIVING ENVIRONMENT: Lives with: lives with their spouse with daughter and her family next door Lives in: House/apartment Stairs: Yes: Internal: 14 steps; on left going up and External: 6 steps; bilateral but cannot reach both Has following equipment at home: None  OCCUPATION: retired  PLOF: Independent and Leisure: go hiking, playing and traveling with grandchildren  PATIENT GOALS:  To improve bone density.   OBJECTIVE:   DIAGNOSTIC FINDINGS:  01/30/2021 Bone Density DXA scan:  The BMD measured at AP Spine L1-L4 is 0.662 g/cm2 with a T-score of -4.3. This patient is considered osteoporotic according to [facility]) criteria.  COGNITION:  Overall cognitive status: Within functional limits for tasks assessed     SENSATION: WFL  POSTURE:  Forward head, rounded shoulders  LE MMT: LLE strength of grossly 4 to 4+/5 throughout, RLE is 5/5 08/05/21: Lt hip flexion 4+/5, abduction 4+/5, knee 5/5   FUNCTIONAL TESTS:  5 times sit to stand: 12.4 sec without UE use  Timed up and go (TUG): 6.97 sec without assistive device Berg Balance Scale: 55/56 with some difficulty with tandem stance  GAIT: Distance walked: 200 ft Assistive device utilized: None Level of assistance: Complete Independence Comments: pt with forward flexed posture    TODAY'S TREATMENT:  07/30/2021: Nustep level 5 x6 min with PT present to discuss status Supine:  chin tuck, shoulder press  2x10 each Resisted backwards gait with 10# 2x5 Standing rows, shoulder extension,  shoulder ER, and shoulder horizontal abduction with red tband 2x10 bilat Shoulder rolls x10 each direction Trigger Point Dry-Needling  Treatment instructions: Expect mild to moderate muscle soreness. S/S of pneumothorax if dry needled over a lung field, and to seek immediate medical attention should they occur. Patient verbalized understanding of these instructions and education. Patient Consent Given: Yes Education handout provided: Yes Muscles treated: right side thoracic multifidi Treatment response/outcome: Utilized skilled palpation to identify trigger points.  During dry needling able to palpate muscle twitch and muscle elongation   07/22/2021: Nustep level 5 x6 min with PT present to discuss status Sit to/from stand holding 5# dumbbell Ambulation with holding 5# dumbbell in each hand x300 ft Supine chest press with 5# dumbbell 2x10 Supine: red theraband horizontal abduction, D2, ER 2x10 each Supine:  chin tuck, shoulder press, arm press, leg lengthening.  x10 each Standing:  heel raises, hip abduction, hip extension.  BLE 2x10 with emphasis on alignment Tandem gait 3x10 ft with occasional UE support of counter SLS with alt hands tapping cone on counter x10 each leg  07/16/21 Review of decompression exercises: good return demo of each Supine: red theraband horizontal abduction, D2, ER x10 each Seated: red theraband horizontal abduction, D2, ER x 10 each- pt can choose position for these at home Standing hip abduction and extension- emphasis on alignment 2x10   PATIENT EDUCATION:  Education details: Decompression exercises and [email or web address] Person educated: Patient Education method: Explanation and Handouts Education comprehension: verbalized understanding and returned demonstration   HOME EXERCISE PROGRAM: Issued Decompression Exercises:  decompression position, chin tuck, shoulder press, leg lengthening Access Code: [email or web address] URL: [email or web address] Date:  08/05/2021 Prepared by: Claiborne Billings  Exercises - Supine Shoulder Horizontal Abduction with Resistance  - 2 x daily - 7 x weekly - 2 sets - 10 reps - Supine Bilateral Shoulder External Rotation with Resistance  - 2 x daily - 7 x weekly - 2 sets - 10 reps - Supine PNF D2 Flexion with Resistance  - 2 x daily - 7 x weekly - 2 sets - 10 reps - Seated Correct Posture  - 1 x daily - 7 x weekly - 3 sets - 10 reps - Standing Hip Abduction with Counter Support  - 2 x daily - 7 x weekly - 2 sets - 10 reps - Standing Hip Extension with Counter Support  - 2 x daily - 7 x weekly - 2 sets - 10 reps - Tandem Stance with Support  - 1 x daily - 7 x weekly - 1 sets - 3 reps - 10 hold - Sit to Stand Without Arm Support  - 2 x daily - 7 x weekly - 2 sets - 10 reps - Single Leg Stance with Support  - 2 x daily - 7 x weekly - 1 sets - 3 reps - 10 hold ASSESSMENT:  CLINICAL IMPRESSION: Pt continues to experience thoracic pain and describes this as an ache.  PT assessed performance of HEP again and she is performing all well and with control.  PT  suggested she perform these supine and she doesn't feel there is much difference when she does.  PT issued yellow band to reduce the resistance to see if this changes her symptoms. PT added balance and standing strength exercises to HEP.  She is using her 5# weights at home as well.   Pt will D/C today to HEP.  OBJECTIVE IMPAIRMENTS decreased balance, decreased strength, and postural dysfunction.   ACTIVITY LIMITATIONS community activity.   PERSONAL FACTORS Age and 1 comorbidity: osteoporosis  are also affecting patient's functional outcome.    GOALS: Goals reviewed with patient? Yes  SHORT TERM GOALS: Target date: 07/24/2021    Pt will be independent with initial HEP. Baseline: Goal status: MET (07/22/2021)  2.  Pt will verbalize understanding of dos and dont's for Osteoporosis. Baseline:  Goal status: Met (07/16/21)   LONG TERM GOALS: Target date: 08/21/2021    Pt  will be independent with advanced HEP. Baseline:  Goal status: MET  2.  Pt will increase left hip strength to 5/5 to allow her to go hiking with her family. Baseline:  Goal status: partially met   3.  Pt will be able to hold tandem stance on either side x30 sec without a loss of balance. Baseline: 30 sec Goal status: MET  4.  Pt will demonstrate proper posture and body mechanics throughout PT session to minimize strain on spine. Baseline:  Goal status: MET    PLAN:  D/C PT to HEP  PHYSICAL THERAPY DISCHARGE SUMMARY  Visits from Start of Care: 5  Current functional level related to goals / functional outcomes: See above for current PT status.     Remaining deficits: Reduced postural endurance and some thoracic pain associated with new strength exercises.  This is improving and pt has HEP in place to address.     Education / Equipment: Osteoporosis education, Economist, HEP   Patient agrees to discharge. Patient goals were partially met. Patient is being discharged due to being pleased with the current functional level.  Sigurd Sos, PT 08/05/21 10:12 AM   [facility] [address], [place], [place] [address] Phone # [phone] Fax [phone]

## 2021-08-06 ENCOUNTER — Telehealth: Payer: Self-pay | Admitting: Family Medicine

## 2021-08-06 ENCOUNTER — Telehealth: Payer: Self-pay | Admitting: Pulmonary Disease

## 2021-08-06 NOTE — Telephone Encounter (Signed)
Patient would like to transfer to [facility] from [place]. Would prefer a morning appointment as those work best. Faythe Ghee to schedule?      Please advise

## 2021-08-06 NOTE — Telephone Encounter (Signed)
Tried to call pt no answer. Please let pt know of message below.

## 2021-08-06 NOTE — Telephone Encounter (Signed)
Called and spoke with patient's husband Donna Hawkins to go over results of scan. Patient is scheduled for OV on 09/15/2021 with Dr. Erin Fulling. Advised her she can always call to see if there is something sooner. She expressed understanding and all questions have been answered. Nothing further needed at this time.    Spero Geralds, MD  08/05/2021  3:22 PM EDT     No evidence of pulmonary fibrosis or scarring in the lungs. Findings could suggest asthma. It looks like she is overdue for follow up so please call patient to schedule this and review further with Dr. Erin Fulling. - ND

## 2021-08-08 ENCOUNTER — Telehealth: Payer: Self-pay | Admitting: Family Medicine

## 2021-08-08 NOTE — Telephone Encounter (Signed)
Pt husband call and stated pt need a call back and stated pt have some question about her labs and want a call back.

## 2021-08-08 NOTE — Telephone Encounter (Signed)
Spoke with the patient as to the reason for her call as I do not see any lab test results in the chart.  Patient stated she was referring to the CT scan.  Stated the pulmonologist reviewed the results and she was concerned after she noticed "Aortic Atherosclerosis"?  Message sent to [facility].

## 2021-08-12 ENCOUNTER — Ambulatory Visit (INDEPENDENT_AMBULATORY_CARE_PROVIDER_SITE_OTHER): Payer: Medicare PPO

## 2021-08-12 VITALS — Ht 67.0 in | Wt 138.0 lb

## 2021-08-12 DIAGNOSIS — Z0000 Encounter for general adult medical examination without abnormal findings: Secondary | ICD-10-CM

## 2021-08-12 NOTE — Progress Notes (Signed)
Subjective:   Donna Hawkins is a 66 y.o. female who presents for Medicare Annual (Subsequent) preventive examination.  Review of Systems    Virtual Visit via Telephone Note  I connected with  [name] on 08/12/21 at 10:45 AM EDT by telephone and verified that I am speaking with the correct person using two identifiers.  Location: Patient: Home Provider: Office Persons participating in the virtual visit: patient/Nurse Health Advisor   I discussed the limitations, risks, security and privacy concerns of performing an evaluation and management service by telephone and the availability of in person appointments. The patient expressed understanding and agreed to proceed.  Interactive audio and video telecommunications were attempted between this nurse and patient, however failed, due to patient having technical difficulties OR patient did not have access to video capability.  We continued and completed visit with audio only.  Some vital signs may be absent or patient reported.   Donna Peaches, LPN  Cardiac Risk Factors include: advanced age (>38mn, >>61women)     Objective:    Today's Vitals   08/12/21 1050  Weight: 138 lb (62.6 kg)  Height: '5\' 7"'$  (1.702 m)   Body mass index is 21.61 kg/m.     08/12/2021   10:58 AM 07/10/2021   10:21 AM 09/25/2013    5:00 AM  Advanced Directives  Does Patient Have a Medical Advance Directive? No No Patient has advance directive, copy not in chart  Would patient like information on creating a medical advance directive? No - Patient declined No - Patient declined     Current Medications (verified) Outpatient Encounter Medications as of 08/12/2021  Medication Sig   ALPRAZolam (XANAX) 0.25 MG tablet Take 1 tablet (0.25 mg total) by mouth 2 (two) times daily as needed for anxiety or sleep.   betamethasone valerate ointment (VALISONE) 0.1 % Apply 1 application. topically 2 (two) times daily.   Cholecalciferol (VITAMIN D3  PO) Take 2,000 Units by mouth daily.   losartan (COZAAR) 50 MG tablet Take 1 tablet (50 mg total) by mouth daily.   Menaquinone-7 (VITAMIN K2 PO) Take by mouth daily.   OVER THE COUNTER MEDICATION Vitamin code Raw calcium   OVER THE COUNTER MEDICATION My Kind Organic plant calcium   No facility-administered encounter medications on file as of 08/12/2021.    Allergies (verified) Patient has no known allergies.   History: Past Medical History:  Diagnosis Date   Anxiety    Appendicitis 09/25/2013   Basal cell carcinoma    Blood in stool    Chicken pox    Dysphagia    GERD (gastroesophageal reflux disease)    High blood pressure    HLD (hyperlipidemia)    Mitral valve prolapse    Past Surgical History:  Procedure Laterality Date   APPENDECTOMY  2015   Open appendectomy with right colectomy   BREAST BIOPSY Right 2001   benign   COLON SURGERY  2015   right hemicolectomy done at time of appendectomy   LAPAROSCOPIC APPENDECTOMY N/A 09/25/2013   Procedure:  LAPAROSCOPIC APPENDECTOMY CONVERTED TO LAPAROSCOPIC ASSISTED RIGHT COLECTOMY;  Surgeon: EGayland Curry MD;  [facility]  Service: General;  Laterality: N/A;   TONSILLECTOMY     Family History  Problem Relation Age of Onset   Stroke Mother 740      brain hemorrhage   Irritable bowel syndrome Mother    COPD Father    Ovarian cancer Maternal Grandmother    Heart disease  Paternal Grandfather 63       MI   Hypertension Daughter    Autoimmune disease Neg Hx    Colon cancer Neg Hx    Rectal cancer Neg Hx    Stomach cancer Neg Hx    Esophageal cancer Neg Hx    Social History   Socioeconomic History   Marital status: Married    Spouse name: Not on file   Number of children: 2   Years of education: Not on file   Highest education level: Not on file  Occupational History   Occupation: retired  Tobacco Use   Smoking status: Never   Smokeless tobacco: Never  Vaping Use   Vaping Use: Never used  Substance and Sexual  Activity   Alcohol use: Yes    Comment: 1 drink, watered down 4 times/week   Drug use: No   Sexual activity: Not on file  Other Topics Concern   Not on file  Social History Narrative   Not on file   Social Determinants of Health   Financial Resource Strain: Low Risk  (08/12/2021)   Overall Financial Resource Strain (CARDIA)    Difficulty of Paying Living Expenses: Not hard at all  Food Insecurity: No Food Insecurity (08/12/2021)   Hunger Vital Sign    Worried About Running Out of Food in the Last Year: Never true    [place] in the Last Year: Never true  Transportation Needs: No Transportation Needs (08/12/2021)   PRAPARE - Hydrologist (Medical): No    Lack of Transportation (Non-Medical): No  Physical Activity: Insufficiently Active (08/12/2021)   Exercise Vital Sign    Days of Exercise per Week: 7 days    Minutes of Exercise per Session: 20 min  Stress: Stress Concern Present (08/12/2021)   [place]    Feeling of Stress : Rather much  Social Connections: Moderately Isolated (08/12/2021)   Social Connection and Isolation Panel [NHANES]    Frequency of Communication with Friends and Family: Twice a week    Frequency of Social Gatherings with Friends and Family: More than three times a week    Attends Religious Services: Never    Marine scientist or Organizations: No    Attends Music therapist: Never    Marital Status: Married    Clinical Intake:  Pre-visit preparation completed: Yes  Pain : No/denies pain     BMI - recorded: 21.75 Nutritional Status: BMI of 19-24  Normal Nutritional Risks: None Diabetes: No  How often do you need to have someone help you when you read instructions, pamphlets, or other written materials from your doctor or pharmacy?: 1 - Never  Diabetic?  No   Activities of Daily Living    08/12/2021   10:57 AM 08/12/2021     8:29 AM  In your present state of health, do you have any difficulty performing the following activities:  Hearing? 0 0  Vision? 0 0  Difficulty concentrating or making decisions? 0 0  Walking or climbing stairs? 0 0  Dressing or bathing? 0 0  Doing errands, shopping? 0 0  Preparing Food and eating ? N N  Using the Toilet? N N  In the past six months, have you accidently leaked urine? N N  Do you have problems with loss of bowel control? N N  Managing your Medications? N N  Managing your Finances? N N  Housekeeping  or managing your Housekeeping? N N    Patient Care Team: [name], MD (Inactive) as PCP - General (Family Medicine)  Indicate any recent Medical Services you may have received from other than [facility] providers in the past year (date may be approximate).     Assessment:   This is a routine wellness examination for Aaryana.  Hearing/Vision screen Hearing Screening - Comments:: No hearing difficulty Vision Screening - Comments:: Wears reading glasses. Followed by [place] issues and exercise activities discussed: Exercise limited by: None identified   Goals Addressed               This Visit's Progress     No current goals (pt-stated)         Depression Screen    08/12/2021   10:54 AM 06/30/2021   10:59 AM 07/31/2020    2:22 PM 06/12/2019    7:55 AM  PHQ 2/9 Scores  PHQ - 2 Score 0 0 0 0  PHQ- 9 Score 0 3 4     Fall Risk    08/12/2021   10:57 AM 08/12/2021    8:29 AM 06/12/2019    7:54 AM  Fall Risk   Falls in the past year? 0 0 0  Number falls in past yr: 0  0  Injury with Fall? 0  0  Risk for fall due to : No Fall Risks      FALL RISK PREVENTION PERTAINING TO THE HOME:  Any stairs in or around the home? Yes  If so, are there any without handrails? No  Home free of loose throw rugs in walkways, pet beds, electrical cords, etc? Yes  Adequate lighting in your home to reduce risk of falls? Yes   ASSISTIVE DEVICES UTILIZED  TO PREVENT FALLS:  Life alert? No  Use of a cane, walker or w/c? No  Grab bars in the bathroom? No  Shower chair or bench in shower? No  Elevated toilet seat or a handicapped toilet? No   TIMED UP AND GO:  Was the test performed? No . Audio Visit  Cognitive Function:      08/12/2021   10:58 AM  6CIT Screen  What Year? 0 points  What month? 0 points  What time? 0 points  Count back from 20 0 points  Months in reverse 0 points  Repeat phrase 0 points  Total Score 0 points    Immunizations Immunization History  Administered Date(s) Administered   Influenza-Unspecified 12/15/2018, 12/16/2020   Moderna Sars-Covid-2 Vaccination 06/23/2020   PFIZER(Purple Top)SARS-COV-2 Vaccination 04/29/2019, 05/20/2019, 11/28/2019, 11/19/2020   Tdap 01/16/2019    TDAP status: Up to date  Flu Vaccine status: Up to date  Pneumococcal vaccine status: Declined,  Education has been provided regarding the importance of this vaccine but patient still declined. Advised may receive this vaccine at local pharmacy or Health Dept. Aware to provide a copy of the vaccination record if obtained from local pharmacy or Health Dept. Verbalized acceptance and understanding.   Covid-19 vaccine status: Completed vaccines  Qualifies for Shingles Vaccine? Yes   Zostavax completed No   Shingrix Completed?: No.    Education has been provided regarding the importance of this vaccine. Patient has been advised to call insurance company to determine out of pocket expense if they have not yet received this vaccine. Advised may also receive vaccine at local pharmacy or Health Dept. Verbalized acceptance and understanding.  Screening Tests Health Maintenance  Topic Date Due  COVID-19 Vaccine (6 - Booster for Pfizer series) 08/28/2021 (Originally 01/14/2021)   Zoster Vaccines- Shingrix (1 of 2) 09/30/2021 (Originally [birth year]22/1973)   Pneumonia Vaccine 76+ Years old (1 - PCV) 08/13/2022 (Originally 04/23/2019)   MAMMOGRAM   08/13/2022 (Originally 06/14/2021)   Hepatitis C Screening  08/13/2022 (Originally [birth year]1972)   INFLUENZA VACCINE  09/23/2021   COLONOSCOPY (Pts 45-6yr Insurance coverage will need to be confirmed)  04/16/2026   TETANUS/TDAP  01/15/2029   DEXA SCAN  Completed   HPV VACCINES  Aged Out    Health Maintenance  There are no preventive care reminders to display for this patient.   Colorectal cancer screening: Type of screening: Colonoscopy. Completed 04/17/19. Repeat every 7 years  Mammogram status: Ordered Patient deferred. Pt provided with contact info and advised to call to schedule appt.   Bone Density status: Completed 01/30/21. Results reflect: Bone density results: OSTEOPOROSIS. Repeat every 2 years.  Lung Cancer Screening: (Low Dose CT Chest recommended if Age 67-80years, 30 pack-year currently smoking OR have quit w/in 15years.) does not qualify.     Additional Screening:  Hepatitis C Screening: does qualify; Completed Patient deferred  Vision Screening: Recommended annual ophthalmology exams for early detection of glaucoma and other disorders of the eye. Is the patient up to date with their annual eye exam?  Yes  Who is the provider or what is the name of the office in which the patient attends annual eye exams[place] is not established with a provider, would they like to be referred to a provider to establish care? No .   Dental Screening: Recommended annual dental exams for proper oral hygiene  Community Resource Referral / Chronic Care Management:  CRR required this visit?  No   CCM required this visit?  No      Plan:     I have personally reviewed and noted the following in the patient's chart:   Medical and social history Use of alcohol, tobacco or illicit drugs  Current medications and supplements including opioid prescriptions.  Functional ability and status Nutritional status Physical activity Advanced directives List of other  physicians Hospitalizations, surgeries, and ER visits in previous 12 months Vitals Screenings to include cognitive, depression, and falls Referrals and appointments  In addition, I have reviewed and discussed with patient certain preventive protocols, quality metrics, and best practice recommendations. A written personalized care plan for preventive services as well as general preventive health recommendations were provided to patient.     BCriselda Peaches LPN   [id number]  Nurse Notes: None

## 2021-08-12 NOTE — Patient Instructions (Addendum)
Donna Hawkins , Thank you for taking time to come for your Medicare Wellness Visit. I appreciate your ongoing commitment to your health goals. Please review the following plan we discussed and let me know if I can assist you in the future.   These are the goals we discussed:  Goals       No current goals (pt-stated)        This is a list of the screening recommended for you and due dates:  Health Maintenance  Topic Date Due   COVID-19 Vaccine (6 - Booster for Pfizer series) 08/28/2021*   Zoster (Shingles) Vaccine (1 of 2) 09/30/2021*   Pneumonia Vaccine (1 - PCV) 08/13/2022*   Mammogram  08/13/2022*   Hepatitis C Screening: USPSTF Recommendation to screen - Ages 18-79 yo.  08/13/2022*   Flu Shot  09/23/2021   Colon Cancer Screening  04/16/2026   Tetanus Vaccine  01/15/2029   DEXA scan (bone density measurement)  Completed   HPV Vaccine  Aged Out  *Topic was postponed. The date shown is not the original due date.     Advanced directives: No  Conditions/risks identified: None  Next appointment: Follow up in one year for your annual wellness visit     Preventive Care 65 Years and Older, Female Preventive care refers to lifestyle choices and visits with your health care provider that can promote health and wellness. What does preventive care include? A yearly physical exam. This is also called an annual well check. Dental exams once or twice a year. Routine eye exams. Ask your health care provider how often you should have your eyes checked. Personal lifestyle choices, including: Daily care of your teeth and gums. Regular physical activity. Eating a healthy diet. Avoiding tobacco and drug use. Limiting alcohol use. Practicing safe sex. Taking low-dose aspirin every day. Taking vitamin and mineral supplements as recommended by your health care provider. What happens during an annual well check? The services and screenings done by your health care provider during your annual  well check will depend on your age, overall health, lifestyle risk factors, and family history of disease. Counseling  Your health care provider may ask you questions about your: Alcohol use. Tobacco use. Drug use. Emotional well-being. Home and relationship well-being. Sexual activity. Eating habits. History of falls. Memory and ability to understand (cognition). Work and work Statistician. Reproductive health. Screening  You may have the following tests or measurements: Height, weight, and BMI. Blood pressure. Lipid and cholesterol levels. These may be checked every 5 years, or more frequently if you are over 56 years old. Skin check. Lung cancer screening. You may have this screening every year starting at age 67 if you have a 30-pack-year history of smoking and currently smoke or have quit within the past 15 years. Fecal occult blood test (FOBT) of the stool. You may have this test every year starting at age 48. Flexible sigmoidoscopy or colonoscopy. You may have a sigmoidoscopy every 5 years or a colonoscopy every 10 years starting at age 43. Hepatitis C blood test. Hepatitis B blood test. Sexually transmitted disease (STD) testing. Diabetes screening. This is done by checking your blood sugar (glucose) after you have not eaten for a while (fasting). You may have this done every 1-3 years. Bone density scan. This is done to screen for osteoporosis. You may have this done starting at age 78. Mammogram. This may be done every 1-2 years. Talk to your health care provider about how often you should have  regular mammograms. Talk with your health care provider about your test results, treatment options, and if necessary, the need for more tests. Vaccines  Your health care provider may recommend certain vaccines, such as: Influenza vaccine. This is recommended every year. Tetanus, diphtheria, and acellular pertussis (Tdap, Td) vaccine. You may need a Td booster every 10 years. Zoster  vaccine. You may need this after age 53. Pneumococcal 13-valent conjugate (PCV13) vaccine. One dose is recommended after age 32. Pneumococcal polysaccharide (PPSV23) vaccine. One dose is recommended after age 37. Talk to your health care provider about which screenings and vaccines you need and how often you need them. This information is not intended to replace advice given to you by your health care provider. Make sure you discuss any questions you have with your health care provider. Document Released: 03/08/2015 Document Revised: 10/30/2015 Document Reviewed: 12/11/2014 Elsevier Interactive Patient Education  2017 [place] Prevention in the Home Falls can cause injuries. They can happen to people of all ages. There are many things you can do to make your home safe and to help prevent falls. What can I do on the outside of my home? Regularly fix the edges of walkways and driveways and fix any cracks. Remove anything that might make you trip as you walk through a door, such as a raised step or threshold. Trim any bushes or trees on the path to your home. Use bright outdoor lighting. Clear any walking paths of anything that might make someone trip, such as rocks or tools. Regularly check to see if handrails are loose or broken. Make sure that both sides of any steps have handrails. Any raised decks and porches should have guardrails on the edges. Have any leaves, snow, or ice cleared regularly. Use sand or salt on walking paths during winter. Clean up any spills in your garage right away. This includes oil or grease spills. What can I do in the bathroom? Use night lights. Install grab bars by the toilet and in the tub and shower. Do not use towel bars as grab bars. Use non-skid mats or decals in the tub or shower. If you need to sit down in the shower, use a plastic, non-slip stool. Keep the floor dry. Clean up any water that spills on the floor as soon as it happens. Remove  soap buildup in the tub or shower regularly. Attach bath mats securely with double-sided non-slip rug tape. Do not have throw rugs and other things on the floor that can make you trip. What can I do in the bedroom? Use night lights. Make sure that you have a light by your bed that is easy to reach. Do not use any sheets or blankets that are too big for your bed. They should not hang down onto the floor. Have a firm chair that has side arms. You can use this for support while you get dressed. Do not have throw rugs and other things on the floor that can make you trip. What can I do in the kitchen? Clean up any spills right away. Avoid walking on wet floors. Keep items that you use a lot in easy-to-reach places. If you need to reach something above you, use a strong step stool that has a grab bar. Keep electrical cords out of the way. Do not use floor polish or wax that makes floors slippery. If you must use wax, use non-skid floor wax. Do not have throw rugs and other things on the floor that  can make you trip. What can I do with my stairs? Do not leave any items on the stairs. Make sure that there are handrails on both sides of the stairs and use them. Fix handrails that are broken or loose. Make sure that handrails are as long as the stairways. Check any carpeting to make sure that it is firmly attached to the stairs. Fix any carpet that is loose or worn. Avoid having throw rugs at the top or bottom of the stairs. If you do have throw rugs, attach them to the floor with carpet tape. Make sure that you have a light switch at the top of the stairs and the bottom of the stairs. If you do not have them, ask someone to add them for you. What else can I do to help prevent falls? Wear shoes that: Do not have high heels. Have rubber bottoms. Are comfortable and fit you well. Are closed at the toe. Do not wear sandals. If you use a stepladder: Make sure that it is fully opened. Do not climb a  closed stepladder. Make sure that both sides of the stepladder are locked into place. Ask someone to hold it for you, if possible. Clearly mark and make sure that you can see: Any grab bars or handrails. First and last steps. Where the edge of each step is. Use tools that help you move around (mobility aids) if they are needed. These include: Canes. Walkers. Scooters. Crutches. Turn on the lights when you go into a dark area. Replace any light bulbs as soon as they burn out. Set up your furniture so you have a clear path. Avoid moving your furniture around. If any of your floors are uneven, fix them. If there are any pets around you, be aware of where they are. Review your medicines with your doctor. Some medicines can make you feel dizzy. This can increase your chance of falling. Ask your doctor what other things that you can do to help prevent falls. This information is not intended to replace advice given to you by your health care provider. Make sure you discuss any questions you have with your health care provider. Document Released: 12/06/2008 Document Revised: 07/18/2015 Document Reviewed: 03/16/2014 Elsevier Interactive Patient Education  2017 [employer].

## 2021-08-12 NOTE — Telephone Encounter (Signed)
Left message on machine for patient to return our call 

## 2021-08-18 NOTE — Telephone Encounter (Signed)
Left a detailed message at the patient's cell number with the information below and requested she call back for a fasting appt.

## 2021-08-21 ENCOUNTER — Ambulatory Visit: Payer: Medicare PPO | Admitting: Nurse Practitioner

## 2021-08-21 ENCOUNTER — Encounter: Payer: Self-pay | Admitting: Nurse Practitioner

## 2021-08-21 VITALS — BP 132/88 | HR 80 | Temp 98.3°F | Ht 67.0 in | Wt 138.8 lb

## 2021-08-21 DIAGNOSIS — I7090 Unspecified atherosclerosis: Secondary | ICD-10-CM | POA: Insufficient documentation

## 2021-08-21 DIAGNOSIS — I341 Nonrheumatic mitral (valve) prolapse: Secondary | ICD-10-CM

## 2021-08-21 DIAGNOSIS — R0602 Shortness of breath: Secondary | ICD-10-CM

## 2021-08-21 LAB — POCT EXHALED NITRIC OXIDE: FeNO level (ppb): 12

## 2021-08-21 MED ORDER — ALBUTEROL SULFATE HFA 108 (90 BASE) MCG/ACT IN AERS: 2.0000 | INHALATION_SPRAY | Freq: Four times a day (QID) | RESPIRATORY_TRACT | 2 refills | Status: DC | PRN

## 2021-08-21 NOTE — Patient Instructions (Addendum)
Referral to cardiology - someone will contact you for scheduling  Follow up with Dr. Erin Fulling in one year or as needed. If symptoms do not improve or worsen, please contact office for sooner follow up or seek emergency care.

## 2021-08-21 NOTE — Assessment & Plan Note (Addendum)
Previous PFTs normal with no significant BD. She did have some mild air trapping on recent HRCT; however, she had no response to ICS/LABA therapy. FeNO nl today. We discussed exercise testing for further evaluation, which she declined. She expressed that her breathing does not impact her or stop her from doing things so she didn't want to investigate it further since her PFTs and imaging were reassuring. Respect her decision. I did advise she go back to see cardiology given her hx of MVP and incidental findings of atherosclerosis on imaging. She also has palpitations; need to ensure no correlation to SOB. She was agreeable to this plan so referral placed today.  Patient Instructions  Referral to cardiology - someone will contact you for scheduling  Follow up with Dr. Erin Fulling in one year or as needed. If symptoms do not improve or worsen, please contact office for sooner follow up or seek emergency care.

## 2021-08-21 NOTE — Progress Notes (Signed)
$'@Patient'e$  ID: Donna Hawkins, female    DOB: 15-Aug-1955, 66 y.o.   MRN: [id number]  Chief Complaint  Patient presents with   Follow-up    Follow up. Patient says she is still the same as she was.     Referring provider: No ref. provider found  HPI: 66 year old female, never smoker followed for shortness of breath.  She has patient Dr. August Albino and last seen in office 10/02/2020.  Past medical history significant for MVP, anxiety, OA.  TEST/EVENTS:  10/03/2020 PFTs: FVC 81, FEV1 84, ratio 82, TLC 103, DLCOcor 115.  Normal PFTs without BD.   08/04/2021 HRCT chest: Atherosclerosis is present.  There is no LAD.  There is mild, bland, bandlike scarring of the bilateral lung bases consistent with previous infectious or inflammatory process.  There is mild, lobular air trapping on expiratory phase suggesting of small airways disease.  10/02/2020: OV with Dr. Erin Fulling for consult for shortness of breath.  Felt like she had been having increased SOB over the last 2 to 3 months but symptoms initially started 2 to 3 years ago.  Feels like she is just unable to take a deep breath.  Symptoms were intermittent with occasional cough as well.  Denied any wheezing.  Did not limit her daily activities.  She did report some trouble swallowing and significant history with GERD, being treated with PPI therapy.  She also has significant anxiety that has been managed by her PCP as well.  Never smoker and no significant exposures.  Plan for PFTs and trial of ICS/LABA therapy.  08/21/2021: Today - follow up Patient presents today for overdue follow up. She previously underwent pulmonary function testing, which was normal. She was trialed on Breo inhaler for possible reactive airways disease/asthma. She reports today she didn't notice any change in her breathing after being on it for a few months so she stopped it back in October. Hasn't noticed any difference. Her breathing is unchanged over the past 3-4 weeks. She  will occasionally have episodes where she feels like she can't take a deep breath and then has to cough, which is dry. This resolves on it's own. There is no identifiable trigger. She actually feels like her breathing is better with exertion. She was able to go hiking recently without any difficulties. She denies any wheezing, weight loss, anorexia, lower extremity swelling, chest pain. She does occasionally have palpitations, which she was evaluated by cardiology for years ago but has not seen them since. These are unchanged as well.   Her recent HRCT did not show any evidence of underlying ILD. She had some mild scarring in the lung bases and mild air trapping. She also had incidental findings of atherosclerosis.   No Known Allergies  Immunization History  Administered Date(s) Administered   Influenza-Unspecified 12/15/2018, 12/16/2020   Moderna Sars-Covid-2 Vaccination 06/23/2020   PFIZER(Purple Top)SARS-COV-2 Vaccination 04/29/2019, 05/20/2019, 11/28/2019, 11/19/2020   Tdap 01/16/2019    Past Medical History:  Diagnosis Date   Anxiety    Appendicitis 09/25/2013   Basal cell carcinoma    Blood in stool    Chicken pox    Dysphagia    GERD (gastroesophageal reflux disease)    High blood pressure    HLD (hyperlipidemia)    Mitral valve prolapse     Tobacco History: Social History   Tobacco Use  Smoking Status Never  Smokeless Tobacco Never   Counseling given: Not Answered   Outpatient Medications Prior to Visit  Medication Sig Dispense Refill  ALPRAZolam (XANAX) 0.25 MG tablet Take 1 tablet (0.25 mg total) by mouth 2 (two) times daily as needed for anxiety or sleep. 60 tablet 2   betamethasone valerate ointment (VALISONE) 0.1 % Apply 1 application. topically 2 (two) times daily. 30 g 0   Cholecalciferol (VITAMIN D3 PO) Take 2,000 Units by mouth daily.     losartan (COZAAR) 50 MG tablet Take 1 tablet (50 mg total) by mouth daily. 90 tablet 1   Menaquinone-7 (VITAMIN K2 PO)  Take by mouth daily.     omeprazole (PRILOSEC) 20 MG capsule Take 25 mg by mouth daily. Take as needed.     OVER THE COUNTER MEDICATION Vitamin code Raw calcium     OVER THE COUNTER MEDICATION My Kind Organic plant calcium     No facility-administered medications prior to visit.     Review of Systems:   Constitutional: No weight loss or gain, night sweats, fevers, chills, fatigue, or lassitude. HEENT: No headaches, difficulty swallowing, tooth/dental problems, or sore throat. No sneezing, itching, ear ache, nasal congestion, or post nasal drip CV:  +occasional palpitations. No chest pain, orthopnea, PND, swelling in lower extremities, anasarca, dizziness, syncope Resp: +shortness of breath; occasional dry cough. No excess mucus or change in color of mucus. No hemoptysis. No wheezing.  No chest wall deformity GI:  +occasional heartburn (on PPI). No abdominal pain, nausea, vomiting, diarrhea, change in bowel habits, loss of appetite, bloody stools.  Skin: No rash, lesions, ulcerations MSK:  No joint pain or swelling.  No decreased range of motion.  No back pain. Neuro: No dizziness or lightheadedness.  Psych: No depression or anxiety. Mood stable.     Physical Exam:  BP 132/88 (BP Location: Right Arm, Patient Position: Sitting, Cuff Size: Normal)   Pulse 80   Temp 98.3 F (36.8 C) (Oral)   Ht '5\' 7"'$  (1.702 m)   Wt 138 lb 12.8 oz (63 kg)   SpO2 100%   BMI 21.74 kg/m   GEN: Pleasant, interactive, well-appearing; in no acute distress. HEENT:  Normocephalic and atraumatic. PERRLA. Sclera white. Nasal turbinates pink, moist and patent bilaterally. No rhinorrhea present. Oropharynx pink and moist, without exudate or edema. No lesions, ulcerations, or postnasal drip.  NECK:  Supple w/ fair ROM. No JVD present. Normal carotid impulses w/o bruits. Thyroid symmetrical with no goiter or nodules palpated. No lymphadenopathy.   CV: RRR, no m/r/g, no peripheral edema. Pulses intact, +2  bilaterally. No cyanosis, pallor or clubbing. PULMONARY:  Unlabored, regular breathing. Clear bilaterally A&P w/o wheezes/rales/rhonchi. No accessory muscle use. No dullness to percussion. GI: BS present and normoactive. Soft, non-tender to palpation. No organomegaly or masses detected. No CVA tenderness. MSK: No erythema, warmth or tenderness. Cap refil <2 sec all extrem. No deformities or joint swelling noted.  Neuro: A/Ox3. No focal deficits noted.   Skin: Warm, no lesions or rashe Psych: Normal affect and behavior. Judgement and thought content appropriate.     Lab Results:  CBC    Component Value Date/Time   WBC 7.9 12/13/2019 0959   RBC 4.06 12/13/2019 0959   HGB 12.8 12/13/2019 0959   HCT 37.8 12/13/2019 0959   PLT 397 12/13/2019 0959   MCV 93.1 12/13/2019 0959   MCH 31.5 12/13/2019 0959   MCHC 33.9 12/13/2019 0959   RDW 12.3 12/13/2019 0959   LYMPHSABS 1,683 12/13/2019 0959   MONOABS 0.8 12/09/2018 1410   EOSABS 142 12/13/2019 0959   BASOSABS 63 12/13/2019 0959    BMET  Component Value Date/Time   NA 137 02/28/2020 1412   K 4.1 02/28/2020 1412   CL 101 02/28/2020 1412   CO2 28 02/28/2020 1412   GLUCOSE 128 (H) 02/28/2020 1412   BUN 19 02/28/2020 1412   CREATININE 0.77 02/28/2020 1412   CREATININE 0.69 12/13/2019 0959   CALCIUM 10.1 02/28/2020 1412   GFRNONAA >90 09/29/2013 0015   GFRAA >90 09/29/2013 0015    BNP No results found for: "BNP"   Imaging:  CT Chest High Resolution  Result Date: 08/04/2021 CLINICAL DATA:  Shortness of breath, abnormal chest radiograph EXAM: CT CHEST WITHOUT CONTRAST TECHNIQUE: Multidetector CT imaging of the chest was performed following the standard protocol without intravenous contrast. High resolution imaging of the lungs, as well as inspiratory and expiratory imaging, was performed. RADIATION DOSE REDUCTION: This exam was performed according to the departmental dose-optimization program which includes automated exposure  control, adjustment of the mA and/or kV according to patient size and/or use of iterative reconstruction technique. COMPARISON:  None Available. FINDINGS: Cardiovascular: Aortic atherosclerosis. Normal heart size. No pericardial effusion. Mediastinum/Nodes: No enlarged mediastinal, hilar, or axillary lymph nodes. Thyroid gland, trachea, and esophagus demonstrate no significant findings. Lungs/Pleura: Mild, bland, bandlike scarring of the bilateral lung bases (series 8, image 109). No evidence of fibrotic interstitial lung disease. Mild, lobular air trapping on expiratory phase imaging. No pleural effusion or pneumothorax. Upper Abdomen: No acute abnormality. Musculoskeletal: No chest wall abnormality. No suspicious osseous lesions identified. IMPRESSION: 1. No evidence of fibrotic interstitial lung disease. Mild, bland, post infectious or inflammatory scarring of the bilateral lung bases. 2. Mild, lobular air trapping on expiratory phase imaging, suggestive of small airways disease. Aortic Atherosclerosis (ICD10-I70.0). Electronically Signed   By: Delanna Ahmadi M.D.   On: 08/04/2021 15:41         Latest Ref Rng & Units 10/03/2020    3:56 PM  PFT Results  FVC-Pre L 2.85   FVC-Predicted Pre % 81   FVC-Post L 2.80   FVC-Predicted Post % 80   Pre FEV1/FVC % % 80   Post FEV1/FCV % % 82   FEV1-Pre L 2.27   FEV1-Predicted Pre % 84   FEV1-Post L 2.29   DLCO uncorrected ml/min/mmHg 25.11   DLCO UNC% % 115   DLCO corrected ml/min/mmHg 25.11   DLCO COR %Predicted % 115   DLVA Predicted % 125   TLC L 5.69   TLC % Predicted % 103   RV % Predicted % 122     No results found for: "NITRICOXIDE"      Assessment & Plan:   Shortness of breath Previous PFTs normal with no significant BD. She did have some mild air trapping on recent HRCT; however, she had no response to ICS/LABA therapy. FeNO nl today. We discussed exercise testing for further evaluation, which she declined. She expressed that her  breathing does not impact her or stop her from doing things so she didn't want to investigate it further since her PFTs and imaging were reassuring. Respect her decision. I did advise she go back to see cardiology given her hx of MVP and incidental findings of atherosclerosis on imaging. She also has palpitations; need to ensure no correlation to SOB. She was agreeable to this plan so referral placed today.  Patient Instructions  Referral to cardiology - someone will contact you for scheduling  Follow up with Dr. Erin Fulling in one year or as needed. If symptoms do not improve or worsen, please contact office for  sooner follow up or seek emergency care.     Atherosclerosis Incidental finding on HRCT. Not on statin therapy. Referred back to cardiology.  Mitral valve prolapse Last echo 2020. See above plan.   I spent 28 minutes of dedicated to the care of this patient on the date of this encounter to include pre-visit review of records, face-to-face time with the patient discussing conditions above, post visit ordering of testing, clinical documentation with the electronic health record, making appropriate referrals as documented, and communicating necessary findings to members of the patients care team.  Clayton Bibles, NP 08/21/2021  Pt aware and understands NP's role.

## 2021-08-21 NOTE — Assessment & Plan Note (Signed)
Incidental finding on HRCT. Not on statin therapy. Referred back to cardiology.

## 2021-08-21 NOTE — Assessment & Plan Note (Addendum)
Last echo 2020. See above plan.

## 2021-09-02 NOTE — Progress Notes (Unsigned)
Cardiology Office Note:   Date:  09/04/2021  NAME:  Donna Hawkins    MRN: [id number] DOB:  11-29-1955   PCP:  [name], MD (Inactive)  Cardiologist:  None  Electrophysiologist:  None   Referring MD: Clayton Bibles, NP   Chief Complaint  Patient presents with   Follow-up        History of Present Illness:   Donna Hawkins is a 66 y.o. female with a hx of HTN, MVP who presents for follow-up.  She reports she is still intermittently short of breath.  Can occur at any time.  Recent chest CT with concern for small airway disease.  Also concern for asthma.  Apparently inhalers have not helped.  Pulmonary referred her back to [place].  She has no appreciable murmur on exam despite mild mitral valve prolapse.  I discussed with her this is likely not the explanation.  I reviewed her chest CT which shows no evidence of coronary calcium.  She does have mild atherosclerosis in the ascending aorta which is benign and likely age related. No significant chest pain. Intermittent palpitations but monitor was normal in 2021.   Problem List 1. HTN 2. Mitral valve prolapse -mild AMVL, mild MR 2021 3. Palpitations -normal zio patch   Past Medical History: Past Medical History:  Diagnosis Date   Anxiety    Appendicitis 09/25/2013   Basal cell carcinoma    Blood in stool    Chicken pox    Dysphagia    GERD (gastroesophageal reflux disease)    High blood pressure    HLD (hyperlipidemia)    Mitral valve prolapse     Past Surgical History: Past Surgical History:  Procedure Laterality Date   APPENDECTOMY  2015   Open appendectomy with right colectomy   BREAST BIOPSY Right 2001   benign   COLON SURGERY  2015   right hemicolectomy done at time of appendectomy   LAPAROSCOPIC APPENDECTOMY N/A 09/25/2013   Procedure:  LAPAROSCOPIC APPENDECTOMY CONVERTED TO LAPAROSCOPIC ASSISTED RIGHT COLECTOMY;  Surgeon: Gayland Curry, MD;  Location: [facility] OR;  Service: General;  Laterality: N/A;    TONSILLECTOMY      Current Medications: Current Meds  Medication Sig   ALPRAZolam (XANAX) 0.25 MG tablet Take 1 tablet (0.25 mg total) by mouth 2 (two) times daily as needed for anxiety or sleep.   betamethasone valerate ointment (VALISONE) 0.1 % Apply 1 application. topically 2 (two) times daily.   Cholecalciferol (VITAMIN D3 PO) Take 2,000 Units by mouth daily.   losartan (COZAAR) 50 MG tablet Take 1 tablet (50 mg total) by mouth daily.   Menaquinone-7 (VITAMIN K2 PO) Take by mouth daily.   omeprazole (PRILOSEC) 20 MG capsule Take 25 mg by mouth daily. Take as needed.   OVER THE COUNTER MEDICATION Vitamin code Raw calcium   OVER THE COUNTER MEDICATION My Kind Organic plant calcium     Allergies:    Patient has no known allergies.   Social History: Social History   Socioeconomic History   Marital status: Married    Spouse name: Not on file   Number of children: 2   Years of education: Not on file   Highest education level: Not on file  Occupational History   Occupation: retired  Tobacco Use   Smoking status: Never   Smokeless tobacco: Never  Vaping Use   Vaping Use: Never used  Substance and Sexual Activity   Alcohol use: Yes    Comment: 1  drink, watered down 4 times/week   Drug use: No   Sexual activity: Not on file  Other Topics Concern   Not on file  Social History Narrative   Not on file   Social Determinants of Health   Financial Resource Strain: Low Risk  (08/12/2021)   Overall Financial Resource Strain (CARDIA)    Difficulty of Paying Living Expenses: Not hard at all  Food Insecurity: No Food Insecurity (08/12/2021)   Hunger Vital Sign    Worried About Running Out of Food in the Last Year: Never true    Ran Out of Food in the Last Year: Never true  Transportation Needs: No Transportation Needs (08/12/2021)   PRAPARE - Hydrologist (Medical): No    Lack of Transportation (Non-Medical): No  Physical Activity: Insufficiently  Active (08/12/2021)   Exercise Vital Sign    Days of Exercise per Week: 7 days    Minutes of Exercise per Session: 20 min  Stress: Stress Concern Present (08/12/2021)   [place]    Feeling of Stress : Rather much  Social Connections: Moderately Isolated (08/12/2021)   Social Connection and Isolation Panel [NHANES]    Frequency of Communication with Friends and Family: Twice a week    Frequency of Social Gatherings with Friends and Family: More than three times a week    Attends Religious Services: Never    Marine scientist or Organizations: No    Attends Music therapist: Never    Marital Status: Married     Family History: The patient's family history includes COPD in her father; Heart disease (age of onset: 76) in her paternal grandfather; Hypertension in her daughter; Irritable bowel syndrome in her mother; Ovarian cancer in her maternal grandmother; Stroke (age of onset: 80) in her mother. There is no history of Autoimmune disease, Colon cancer, Rectal cancer, Stomach cancer, or Esophageal cancer.  ROS:   All other ROS reviewed and negative. Pertinent positives noted in the HPI.     EKGs/Labs/Other Studies Reviewed:   The following studies were personally reviewed by me today:  TTE 12/13/2018  1. Left ventricular ejection fraction, by visual estimation, is 60 to  65%. The left ventricle has normal function. Normal left ventricular size.  There is mildly increased left ventricular hypertrophy.   2. Global right ventricle has normal systolic function.The right  ventricular size is normal. No increase in right ventricular wall  thickness.   3. The mitral valve is abnormal. Mild anterior leaflet prolapse. Mild  mitral valve regurgitation.   4. Left atrial size was normal.   5. Right atrial size was normal.   6. The tricuspid valve is normal in structure. Tricuspid valve  regurgitation is trivial.    7. The aortic valve is tricuspid Aortic valve regurgitation was not  visualized by color flow Doppler.   8. The pulmonic valve was not well visualized. Pulmonic valve  regurgitation is trivial by color flow Doppler.   9. Normal pulmonary artery systolic pressure.  10. The inferior vena cava is normal in size with greater than 50%  respiratory variability, suggesting right atrial pressure of 3 mmHg.   Recent Labs: No results found for requested labs within last 365 days.   Recent Lipid Panel    Component Value Date/Time   CHOL 260 (H) 12/13/2019 0959   TRIG 91 12/13/2019 0959   HDL 88 12/13/2019 0959   CHOLHDL 3.0 12/13/2019 0959  VLDL 20.2 06/12/2019 0912   LDLCALC 152 (H) 12/13/2019 0959    Physical Exam:   VS:  BP (!) 158/96   Pulse 96   Ht '5\' 7"'$  (1.702 m)   Wt 137 lb 3.2 oz (62.2 kg)   SpO2 99%   BMI 21.49 kg/m    Wt Readings from Last 3 Encounters:  09/04/21 137 lb 3.2 oz (62.2 kg)  08/21/21 138 lb 12.8 oz (63 kg)  08/12/21 138 lb (62.6 kg)    General: Well nourished, well developed, in no acute distress Head: Atraumatic, normal size  Eyes: PEERLA, EOMI  Neck: Supple, no JVD Endocrine: No thryomegaly Cardiac: Normal S1, S2; RRR; no murmurs, rubs, or gallops Lungs: Clear to auscultation bilaterally, no wheezing, rhonchi or rales  Abd: Soft, nontender, no hepatomegaly  Ext: No edema, pulses 2+ Musculoskeletal: No deformities, BUE and BLE strength normal and equal Skin: Warm and dry, no rashes   Neuro: Alert and oriented to person, place, time, and situation, CNII-XII grossly intact, no focal deficits  Psych: Normal mood and affect   ASSESSMENT:   Wilhemina Grall is a 66 y.o. female who presents for the following: 1. SOB (shortness of breath) on exertion   2. Mitral valve prolapse     PLAN:   1. SOB (shortness of breath) on exertion 2. Mitral valve prolapse -Chest CT with small airway disease.  Concerns for asthma.  Symptoms are not explained by  congestive heart failure or mitral valve prolapse.  Her heart is normal.  She does need a repeat echo but her murmur has not changed.  She has mild mitral valve regurgitation.  I will reach out to pulmonary to determine what needs to be done.  Pulmonary function testing last year was normal.  Her CT scan though is concerning.  Possibly she would benefit from inhalers.  She will see me back in 2 years and I will follow-up with her results by phone.  Her coronary calcium score is 0.  Her HDL cholesterol is 88.  She really is low risk for obstructive CAD.  Her aortic atherosclerosis is likely expected for age.  We will see her back in 2 years.  I will ask pulmonary to reevaluate her CT findings.  Disposition: Return in about 2 years (around 09/05/2023).  Medication Adjustments/Labs and Tests Ordered: Current medicines are reviewed at length with the patient today.  Concerns regarding medicines are outlined above.  Orders Placed This Encounter  Procedures   ECHOCARDIOGRAM COMPLETE   No orders of the defined types were placed in this encounter.   Patient Instructions  Medication Instructions:  The current medical regimen is effective;  continue present plan and medications.  *If you need a refill on your cardiac medications before your next appointment, please call your pharmacy*   Testing/Procedures:  Echocardiogram - Your physician has requested that you have an echocardiogram. Echocardiography is a painless test that uses sound waves to create images of your heart. It provides your doctor with information about the size and shape of your heart and how well your heart's chambers and valves are working. This procedure takes approximately one hour. There are no restrictions for this procedure.    Follow-Up: At [facility], you and your health needs are our priority.  As part of our continuing mission to provide you with exceptional heart care, we have created designated Provider Care Teams.   These Care Teams include your primary Cardiologist (physician) and Advanced Practice Providers (APPs -  Physician  Assistants and Nurse Practitioners) who all work together to provide you with the care you need, when you need it.  We recommend signing up for the patient portal called "MyChart".  Sign up information is provided on this After Visit Summary.  MyChart is used to connect with patients for Virtual Visits (Telemedicine).  Patients are able to view lab/test results, encounter notes, upcoming appointments, etc.  Non-urgent messages can be sent to your provider as well.   To learn more about what you can do with MyChart, go to [email or web address].    Your next appointment:   2 year(s)  The format for your next appointment:   In Person  Provider:   Eleonore Chiquito, MD            Time Spent with Patient: I have spent a total of 25 minutes with patient reviewing hospital notes, telemetry, EKGs, labs and examining the patient as well as establishing an assessment and plan that was discussed with the patient.  > 50% of time was spent in direct patient care.  Signed, Addison Naegeli. Audie Box, MD, [place]  [address] [place], [place] [address] [phone]  09/04/2021 11:13 AM

## 2021-09-04 ENCOUNTER — Ambulatory Visit: Payer: Medicare PPO | Admitting: Cardiovascular Disease

## 2021-09-04 ENCOUNTER — Encounter: Payer: Self-pay | Admitting: Cardiovascular Disease

## 2021-09-04 ENCOUNTER — Ambulatory Visit (HOSPITAL_COMMUNITY): Payer: Medicare PPO | Attending: Cardiovascular Disease

## 2021-09-04 VITALS — BP 158/96 | HR 96 | Ht 67.0 in | Wt 137.2 lb

## 2021-09-04 DIAGNOSIS — I341 Nonrheumatic mitral (valve) prolapse: Secondary | ICD-10-CM

## 2021-09-04 DIAGNOSIS — R0602 Shortness of breath: Secondary | ICD-10-CM

## 2021-09-04 LAB — ECHOCARDIOGRAM COMPLETE
Area-P 1/2: 3.68 cm2
Height: 67 in
S' Lateral: 2.1 cm
Weight: 2195.2 oz

## 2021-09-04 NOTE — Patient Instructions (Signed)
Medication Instructions:  The current medical regimen is effective;  continue present plan and medications.  *If you need a refill on your cardiac medications before your next appointment, please call your pharmacy*   Testing/Procedures:  Echocardiogram - Your physician has requested that you have an echocardiogram. Echocardiography is a painless test that uses sound waves to create images of your heart. It provides your doctor with information about the size and shape of your heart and how well your heart's chambers and valves are working. This procedure takes approximately one hour. There are no restrictions for this procedure.    Follow-Up: At [facility], you and your health needs are our priority.  As part of our continuing mission to provide you with exceptional heart care, we have created designated Provider Care Teams.  These Care Teams include your primary Cardiologist (physician) and Advanced Practice Providers (APPs -  Physician Assistants and Nurse Practitioners) who all work together to provide you with the care you need, when you need it.  We recommend signing up for the patient portal called "MyChart".  Sign up information is provided on this After Visit Summary.  MyChart is used to connect with patients for Virtual Visits (Telemedicine).  Patients are able to view lab/test results, encounter notes, upcoming appointments, etc.  Non-urgent messages can be sent to your provider as well.   To learn more about what you can do with MyChart, go to [email or web address].    Your next appointment:   2 year(s)  The format for your next appointment:   In Person  Provider:   Eleonore Chiquito, MD

## 2021-09-05 ENCOUNTER — Encounter: Payer: Self-pay | Admitting: Family Medicine

## 2021-09-05 NOTE — Telephone Encounter (Signed)
-   [employer] approved - PA on file - estimated co pay of $40 to be collected at time of visit. - okay to schedule at any time.

## 2021-09-10 ENCOUNTER — Telehealth: Payer: Self-pay

## 2021-09-10 NOTE — Telephone Encounter (Signed)
-----   Message from [place], MD sent at 09/08/2021  8:58 AM EDT ----- Regarding: Follow-up [name],  Patient's cardiologist contacted our office to discuss her symptoms of shortness of breath. After discussion, Dr. Erin Fulling would like to see her again when convenient for the patient. Could you please call patient with the above info and offer appointment?  Thank you!

## 2021-09-10 NOTE — Telephone Encounter (Signed)
Called patient but she did not answer. Left message for patient to call back.  

## 2021-09-15 ENCOUNTER — Ambulatory Visit: Payer: Medicare PPO | Admitting: Pulmonary Disease

## 2021-09-18 ENCOUNTER — Encounter: Payer: Self-pay | Admitting: Cardiovascular Disease

## 2021-10-16 ENCOUNTER — Other Ambulatory Visit: Payer: Self-pay | Admitting: *Deleted

## 2021-10-16 DIAGNOSIS — L309 Dermatitis, unspecified: Secondary | ICD-10-CM

## 2021-10-16 MED ORDER — BETAMETHASONE VALERATE 0.1 % EX OINT: 1.0000 | TOPICAL_OINTMENT | Freq: Two times a day (BID) | CUTANEOUS | 0 refills | Status: DC

## 2021-11-17 ENCOUNTER — Encounter: Payer: Self-pay | Admitting: Pulmonary Disease

## 2021-11-17 ENCOUNTER — Ambulatory Visit: Payer: Medicare PPO | Admitting: Pulmonary Disease

## 2021-11-17 VITALS — BP 142/86 | HR 77 | Ht 67.0 in | Wt 135.0 lb

## 2021-11-17 DIAGNOSIS — M2550 Pain in unspecified joint: Secondary | ICD-10-CM | POA: Diagnosis not present

## 2021-11-17 DIAGNOSIS — J984 Other disorders of lung: Secondary | ICD-10-CM

## 2021-11-17 LAB — SEDIMENTATION RATE: Sed Rate: 10 mm/hr (ref 0–30)

## 2021-11-17 LAB — C-REACTIVE PROTEIN: CRP: <1 mg/dL (ref 0.5–20.0)

## 2021-11-17 NOTE — Patient Instructions (Addendum)
We will check inflammatory labs today for autoimmune conditions  We will message you with the results  We can consider budesonide nebulizer treatments to see if this helps with the small airways disease  Follow up in 4 months

## 2021-11-17 NOTE — Progress Notes (Unsigned)
Synopsis: Referred in August 2022 for shortness of breath by Micheline Rough, MD  Subjective:   PATIENT ID: Donna Hawkins: female DOB: 08/19/1955, MRN: [id number]  HPI  Chief Complaint  Patient presents with   Follow-up    Increased SOB for the past few weeks. Feels like she is not getting enough air after taking deep breaths.    Donna Hawkins is a 66 year old woman, never smoker with GERD and hypertension who returns to pulmonary clinic for shortness of breath.   PFTs 10/03/20 were within normal limits. HRCT Chest 08/04/21 showed mild lobular air trapping on expiratory phase concerning for small airways disease.   She was seen by Roxan Diesel, NP 08/21/21 in follow up. She did not report response to ICS/LABA therapy. She was referred to cardiology for palpitations and evidence of aortic atherosclerosis on CT chest imaging. Echo 07/2021 shows EF 65-70%. RV systolic function and size is normal.   Thumb stiffness and right hip.   20 years ago, a doctor thought she had autoimmune condition. +Dry eyes and Dry mouth - several years ago, OTC eye drops + biotene mouth wash.   No FH of autoimmune conditions.    Initial OV 10/02/20 She reports having shortness of breath for several years.  The shortness of breath is noticeable at rest and even with exertion.  The shortness of breath has not limited her daily activities.  She denies any wheezing but has an intermittent cough that is dry which she feels she can take better breaths after coughing.  She struggles with insomnia but she feels sometimes the shortness of breath can wake her up at night.  She complains of sinus congestion that is chronic and denies any significant postnasal drainage.  She also reports trouble swallowing and has significant history of GERD/gastritis which is being treated by PPI therapy.  She also has significant anxiety that she has been managing.  Patient denies any history of trouble breathing in childhood and  denies any secondhand smoke exposure.  She is a never smoker.  She denies any occupations that would have exposed her to different dust or chemicals.  She denies any hobbies with harmful exposures and she has only had dogs as pets in the past.  Past Medical History:  Diagnosis Date   Anxiety    Appendicitis 09/25/2013   Basal cell carcinoma    Blood in stool    Chicken pox    Dysphagia    GERD (gastroesophageal reflux disease)    High blood pressure    HLD (hyperlipidemia)    Mitral valve prolapse      Family History  Problem Relation Age of Onset   Stroke Mother 75       brain hemorrhage   Irritable bowel syndrome Mother    COPD Father    Ovarian cancer Maternal Grandmother    Heart disease Paternal Grandfather 38       MI   Hypertension Daughter    Autoimmune disease Neg Hx    Colon cancer Neg Hx    Rectal cancer Neg Hx    Stomach cancer Neg Hx    Esophageal cancer Neg Hx      Social History   Socioeconomic History   Marital status: Married    Spouse name: Not on file   Number of children: 2   Years of education: Not on file   Highest education level: Not on file  Occupational History   Occupation: retired  Tobacco Use  Smoking status: Never   Smokeless tobacco: Never  Vaping Use   Vaping Use: Never used  Substance and Sexual Activity   Alcohol use: Yes    Comment: 1 drink, watered down 4 times/week   Drug use: No   Sexual activity: Not on file  Other Topics Concern   Not on file  Social History Narrative   Not on file   Social Determinants of Health   Financial Resource Strain: Low Risk  (08/12/2021)   Overall Financial Resource Strain (CARDIA)    Difficulty of Paying Living Expenses: Not hard at all  Food Insecurity: No Food Insecurity (08/12/2021)   Hunger Vital Sign    Worried About Running Out of Food in the Last Year: Never true    Ran Out of Food in the Last Year: Never true  Transportation Needs: No Transportation Needs (08/12/2021)   PRAPARE  - Hydrologist (Medical): No    Lack of Transportation (Non-Medical): No  Physical Activity: Insufficiently Active (08/12/2021)   Exercise Vital Sign    Days of Exercise per Week: 7 days    Minutes of Exercise per Session: 20 min  Stress: Stress Concern Present (08/12/2021)   [place]    Feeling of Stress : Rather much  Social Connections: Moderately Isolated (08/12/2021)   Social Connection and Isolation Panel [NHANES]    Frequency of Communication with Friends and Family: Twice a week    Frequency of Social Gatherings with Friends and Family: More than three times a week    Attends Religious Services: Never    Marine scientist or Organizations: No    Attends Archivist Meetings: Never    Marital Status: Married  Human resources officer Violence: Not At Risk (08/12/2021)   Humiliation, Afraid, Rape, and Kick questionnaire    Fear of Current or Ex-Partner: No    Emotionally Abused: No    Physically Abused: No    Sexually Abused: No     No Known Allergies   Outpatient Medications Prior to Visit  Medication Sig Dispense Refill   ALPRAZolam (XANAX) 0.25 MG tablet Take 1 tablet (0.25 mg total) by mouth 2 (two) times daily as needed for anxiety or sleep. 60 tablet 2   Cholecalciferol (VITAMIN D3 PO) Take 2,000 Units by mouth daily.     losartan (COZAAR) 50 MG tablet Take 1 tablet (50 mg total) by mouth daily. 90 tablet 1   Menaquinone-7 (VITAMIN K2 PO) Take by mouth daily.     Omega-3 Fatty Acids (FISH OIL PO) Take 1 capsule by mouth daily.     omeprazole (PRILOSEC) 20 MG capsule Take 25 mg by mouth daily. Take as needed.     OVER THE COUNTER MEDICATION Vitamin code Raw calcium     OVER THE COUNTER MEDICATION My Kind Organic plant calcium     betamethasone valerate ointment (VALISONE) 0.1 % Apply 1 Application topically 2 (two) times daily. 30 g 0   No facility-administered  medications prior to visit.    Review of Systems  Constitutional:  Negative for chills, fever, malaise/fatigue and weight loss.  HENT:  Positive for congestion. Negative for sinus pain and sore throat.   Eyes: Negative.   Respiratory:  Positive for cough and shortness of breath. Negative for hemoptysis, sputum production and wheezing.   Cardiovascular:  Negative for chest pain, palpitations, orthopnea, claudication and leg swelling.  Gastrointestinal:  Positive for heartburn. Negative for abdominal  pain, nausea and vomiting.  Genitourinary: Negative.   Musculoskeletal:  Positive for joint pain. Negative for myalgias.  Skin:  Negative for rash.  Neurological:  Positive for headaches. Negative for weakness.  Endo/Heme/Allergies: Negative.   Psychiatric/Behavioral:  The patient is nervous/anxious.     Objective:   Vitals:   11/17/21 1018  BP: (!) 142/86  Pulse: 77  SpO2: 99%  Weight: 135 lb (61.2 kg)  Height: '5\' 7"'$  (1.702 m)   Physical Exam Constitutional:      General: She is not in acute distress.    Appearance: She is not ill-appearing.  HENT:     Head: Normocephalic and atraumatic.     Mouth/Throat:     Mouth: Mucous membranes are moist.     Pharynx: Oropharynx is clear.  Eyes:     General: No scleral icterus.    Conjunctiva/sclera: Conjunctivae normal.     Pupils: Pupils are equal, round, and reactive to light.  Cardiovascular:     Rate and Rhythm: Normal rate and regular rhythm.     Pulses: Normal pulses.     Heart sounds: Normal heart sounds. No murmur heard. Pulmonary:     Effort: Pulmonary effort is normal.     Breath sounds: Normal breath sounds. No wheezing, rhonchi or rales.  Abdominal:     General: Bowel sounds are normal.     Palpations: Abdomen is soft.  Musculoskeletal:     Right lower leg: No edema.     Left lower leg: No edema.  Lymphadenopathy:     Cervical: No cervical adenopathy.  Skin:    General: Skin is warm and dry.  Neurological:      General: No focal deficit present.     Mental Status: She is alert.  Psychiatric:        Mood and Affect: Mood normal.        Behavior: Behavior normal.        Thought Content: Thought content normal.        Judgment: Judgment normal.    CBC    Component Value Date/Time   WBC 7.9 12/13/2019 0959   RBC 4.06 12/13/2019 0959   HGB 12.8 12/13/2019 0959   HCT 37.8 12/13/2019 0959   PLT 397 12/13/2019 0959   MCV 93.1 12/13/2019 0959   MCH 31.5 12/13/2019 0959   MCHC 33.9 12/13/2019 0959   RDW 12.3 12/13/2019 0959   LYMPHSABS 1,683 12/13/2019 0959   MONOABS 0.8 12/09/2018 1410   EOSABS 142 12/13/2019 0959   BASOSABS 63 12/13/2019 0959      Latest Ref Rng & Units 02/28/2020    2:12 PM 12/13/2019    9:59 AM 12/09/2018    2:10 PM  BMP  Glucose 70 - 99 mg/dL 128  96  99   BUN 6 - 23 mg/dL '19  12  14   '$ Creatinine 0.40 - 1.20 mg/dL 0.77  0.69  0.70   BUN/Creat Ratio 6 - 22 (calc)  NOT APPLICABLE    Sodium 720 - 145 mEq/L 137  138  137   Potassium 3.5 - 5.1 mEq/L 4.1  4.3  4.4   Chloride 96 - 112 mEq/L 101  103  99   CO2 19 - 32 mEq/L '28  24  28   '$ Calcium 8.4 - 10.5 mg/dL 10.1  9.8    9.8  10.0    Chest imaging: CXR 08/02/20 Hyperinflation and flattening of the diaphragms. Mildly coarsened reticular interstitial changes and bronchitic features are noted.  Biapical pleuroparenchymal scarring. No consolidation, features of edema, pneumothorax, or effusion. The aorta is calcified. The remaining cardiomediastinal contours are unremarkable.  PFT:    Latest Ref Rng & Units 10/03/2020    3:56 PM  PFT Results  FVC-Pre L 2.85   FVC-Predicted Pre % 81   FVC-Post L 2.80   FVC-Predicted Post % 80   Pre FEV1/FVC % % 80   Post FEV1/FCV % % 82   FEV1-Pre L 2.27   FEV1-Predicted Pre % 84   FEV1-Post L 2.29   DLCO uncorrected ml/min/mmHg 25.11   DLCO UNC% % 115   DLCO corrected ml/min/mmHg 25.11   DLCO COR %Predicted % 115   DLVA Predicted % 125   TLC L 5.69   TLC % Predicted % 103    RV % Predicted % 122    Echo 12/13/2018: LVEF 60-65%. LV normal function and size. Mild LVH. RV systolic function is normal. RA and LA size are normal. Mitral valve prolapse, mild.   Assessment & Plan:   No diagnosis found.  Discussion: Lielle Vandervort is a 66 year old woman, never smoker with GERD and hypertension who is referred to pulmonary clinic for shortness of breath.   Her shortness of breath is possibly related to reactive airways disease given her intermittent symptoms of cough and shortness of breath.  There is also concern for hyperinflation on her recent chest x-ray.  There is also bronchitic features and mildly coarsened reticular interstitial changes as well which could be related to chronic GERD with the possibility of interstitial lung disease.  We will trial her on Breo Ellipta for 1 month and monitor for any symptom relief.  We will also check pulmonary function tests for further evaluation.  Her pulmonary function tests are abnormal we will then follow-up with a CT chest scan.  Her dyspnea may be also related to anxiety which she is aware of and we discussed the potential need for seeking therapy.  She is to follow-up in 3 months.  Freda Jackson, MD [facility] Office: [phone]    Current Outpatient Medications:    ALPRAZolam (XANAX) 0.25 MG tablet, Take 1 tablet (0.25 mg total) by mouth 2 (two) times daily as needed for anxiety or sleep., Disp: 60 tablet, Rfl: 2   Cholecalciferol (VITAMIN D3 PO), Take 2,000 Units by mouth daily., Disp: , Rfl:    losartan (COZAAR) 50 MG tablet, Take 1 tablet (50 mg total) by mouth daily., Disp: 90 tablet, Rfl: 1   Menaquinone-7 (VITAMIN K2 PO), Take by mouth daily., Disp: , Rfl:    Omega-3 Fatty Acids (FISH OIL PO), Take 1 capsule by mouth daily., Disp: , Rfl:    omeprazole (PRILOSEC) 20 MG capsule, Take 25 mg by mouth daily. Take as needed., Disp: , Rfl:    OVER THE COUNTER MEDICATION, Vitamin code Raw  calcium, Disp: , Rfl:    OVER THE COUNTER MEDICATION, My Kind Organic plant calcium, Disp: , Rfl:

## 2021-11-18 ENCOUNTER — Encounter: Payer: Self-pay | Admitting: Pulmonary Disease

## 2021-11-18 DIAGNOSIS — D225 Melanocytic nevi of trunk: Secondary | ICD-10-CM | POA: Diagnosis not present

## 2021-11-18 DIAGNOSIS — L249 Irritant contact dermatitis, unspecified cause: Secondary | ICD-10-CM | POA: Diagnosis not present

## 2021-11-18 DIAGNOSIS — L814 Other melanin hyperpigmentation: Secondary | ICD-10-CM | POA: Diagnosis not present

## 2021-11-18 DIAGNOSIS — L821 Other seborrheic keratosis: Secondary | ICD-10-CM | POA: Diagnosis not present

## 2021-11-18 LAB — SJOGREN'S SYNDROME ANTIBODS(SSA + SSB)
SSA (Ro) (ENA) Antibody, IgG: <1 AI
SSB (La) (ENA) Antibody, IgG: <1 AI

## 2021-11-18 LAB — RHEUMATOID FACTOR: Rheumatoid fact SerPl-aCnc: <14 IU/mL (ref <14)

## 2021-11-21 LAB — HYPERSENSITIVITY PNEUMONITIS
A. Pullulans Abs: NEGATIVE
A.Fumigatus #1 Abs: NEGATIVE
Micropolyspora faeni, IgG: NEGATIVE
Pigeon Serum Abs: NEGATIVE
Thermoact. Saccharii: NEGATIVE
Thermoactinomyces vulgaris, IgG: NEGATIVE

## 2021-11-21 LAB — RNP ANTIBODIES: ENA RNP Ab: <0.2 AI (ref 0.0–0.9)

## 2021-12-03 ENCOUNTER — Encounter: Payer: Self-pay | Admitting: Internal Medicine

## 2021-12-03 ENCOUNTER — Ambulatory Visit: Payer: Medicare PPO | Admitting: Internal Medicine

## 2021-12-03 VITALS — BP 118/78 | HR 75 | Temp 97.6°F | Resp 16 | Ht 67.0 in | Wt 134.4 lb

## 2021-12-03 DIAGNOSIS — Z1231 Encounter for screening mammogram for malignant neoplasm of breast: Secondary | ICD-10-CM | POA: Diagnosis not present

## 2021-12-03 DIAGNOSIS — I10 Essential (primary) hypertension: Secondary | ICD-10-CM | POA: Diagnosis not present

## 2021-12-03 DIAGNOSIS — K219 Gastro-esophageal reflux disease without esophagitis: Secondary | ICD-10-CM

## 2021-12-03 DIAGNOSIS — M810 Age-related osteoporosis without current pathological fracture: Secondary | ICD-10-CM | POA: Diagnosis not present

## 2021-12-03 DIAGNOSIS — I700 Atherosclerosis of aorta: Secondary | ICD-10-CM | POA: Diagnosis not present

## 2021-12-03 DIAGNOSIS — Z23 Encounter for immunization: Secondary | ICD-10-CM

## 2021-12-03 DIAGNOSIS — E782 Mixed hyperlipidemia: Secondary | ICD-10-CM

## 2021-12-03 NOTE — Progress Notes (Signed)
New Patient Office Visit     CC/Reason for Visit: Establish care, discuss chronic medical conditions Previous PCP: Ethlyn Gallery, MD  Last Visit: Earlier this year  HPI: Donna Hawkins is a 66 y.o. female who is coming in today for the above mentioned reasons. Past Medical History is significant for: Osteoporosis, insomnia, hypertension, GERD.  She has no acute concerns today.  She does not smoke, she has no known allergies, her past surgical history significant for an appendectomy and a right-sided colectomy, family history significant for a father with melanoma and a mother who died from a brain bleed.  She is overdue for mammogram, she had a colonoscopy in 2021 as well as a Pap smear.  She is overdue for flu, COVID, pneumonia, shingles vaccinations.   Past Medical/Surgical History: Past Medical History:  Diagnosis Date   Anxiety    Appendicitis 09/25/2013   Basal cell carcinoma    Blood in stool    Chicken pox    Dysphagia    GERD (gastroesophageal reflux disease)    High blood pressure    HLD (hyperlipidemia)    Mitral valve prolapse     Past Surgical History:  Procedure Laterality Date   APPENDECTOMY  2015   Open appendectomy with right colectomy   BREAST BIOPSY Right 2001   benign   COLON SURGERY  2015   right hemicolectomy done at time of appendectomy   LAPAROSCOPIC APPENDECTOMY N/A 09/25/2013   Procedure:  LAPAROSCOPIC APPENDECTOMY CONVERTED TO LAPAROSCOPIC ASSISTED RIGHT COLECTOMY;  Surgeon: Gayland Curry, MD;  Location: [place];  Service: General;  Laterality: N/A;   TONSILLECTOMY      Social History:  reports that she has never smoked. She has never used smokeless tobacco. She reports current alcohol use. She reports that she does not use drugs.  Allergies: No Known Allergies  Family History:  Family History  Problem Relation Age of Onset   Stroke Mother 13       brain hemorrhage   Irritable bowel syndrome Mother    COPD Father    Ovarian cancer  Maternal Grandmother    Heart disease Paternal Grandfather 62       MI   Hypertension Daughter    Autoimmune disease Neg Hx    Colon cancer Neg Hx    Rectal cancer Neg Hx    Stomach cancer Neg Hx    Esophageal cancer Neg Hx      Current Outpatient Medications:    ALPRAZolam (XANAX) 0.25 MG tablet, Take 1 tablet (0.25 mg total) by mouth 2 (two) times daily as needed for anxiety or sleep., Disp: 60 tablet, Rfl: 2   augmented betamethasone dipropionate (DIPROLENE-AF) 0.05 % cream, Apply topically 2 (two) times daily as needed., Disp: , Rfl:    Cholecalciferol (VITAMIN D3 PO), Take 2,000 Units by mouth daily., Disp: , Rfl:    losartan (COZAAR) 50 MG tablet, Take 1 tablet (50 mg total) by mouth daily., Disp: 90 tablet, Rfl: 1   Magnesium 300 MG CAPS, Take 1 capsule by mouth daily., Disp: , Rfl:    Menaquinone-7 (VITAMIN K2 PO), Take by mouth daily., Disp: , Rfl:    Omega-3 Fatty Acids (FISH OIL PO), Take 1 capsule by mouth daily., Disp: , Rfl:    omeprazole (PRILOSEC) 20 MG capsule, Take 25 mg by mouth daily. Take as needed., Disp: , Rfl:    OVER THE COUNTER MEDICATION, Vitamin code Raw calcium, Disp: , Rfl:    OVER THE COUNTER  MEDICATION, My Kind Organic plant calcium, Disp: , Rfl:   Review of Systems:  Constitutional: Denies fever, chills, diaphoresis, appetite change and fatigue.  HEENT: Denies photophobia, eye pain, redness, hearing loss, ear pain, congestion, sore throat, rhinorrhea, sneezing, mouth sores, trouble swallowing, neck pain, neck stiffness and tinnitus.   Respiratory: Denies SOB, DOE, cough, chest tightness,  and wheezing.   Cardiovascular: Denies chest pain, palpitations and leg swelling.  Gastrointestinal: Denies nausea, vomiting, abdominal pain, diarrhea, constipation, blood in stool and abdominal distention.  Genitourinary: Denies dysuria, urgency, frequency, hematuria, flank pain and difficulty urinating.  Endocrine: Denies: hot or cold intolerance, sweats, changes in  hair or nails, polyuria, polydipsia. Musculoskeletal: Denies myalgias, back pain, joint swelling, arthralgias and gait problem.  Skin: Denies pallor, rash and wound.  Neurological: Denies dizziness, seizures, syncope, weakness, light-headedness and headaches.  Hematological: Denies adenopathy. Easy bruising, personal or family bleeding history  Psychiatric/Behavioral: Denies suicidal ideation, mood changes, confusion, nervousness, sleep disturbance and agitation    Physical Exam: Vitals:   12/03/21 1034  BP: 118/78  Pulse: 75  Resp: 16  Temp: 97.6 F (36.4 C)  TempSrc: Temporal  SpO2: 99%  Weight: 134 lb 6 oz (61 kg)  Height: '5\' 7"'$  (1.702 m)   Body mass index is 21.05 kg/m.  Constitutional: NAD, calm, comfortable Eyes: PERRL, lids and conjunctivae normal ENMT: Mucous membranes are moist.  Respiratory: clear to auscultation bilaterally, no wheezing, no crackles. Normal respiratory effort. No accessory muscle use.  Cardiovascular: Regular rate and rhythm, no murmurs / rubs / gallops. No extremity edema. Psychiatric: Normal judgment and insight. Alert and oriented x 3. Normal mood.    Impression and Plan:  Age-related osteoporosis without current pathological fracture  Aortic atherosclerosis (HCC)  Mixed hyperlipidemia  Gastroesophageal reflux disease, unspecified whether esophagitis present  Primary hypertension  Encounter for screening mammogram for malignant neoplasm of breast - Plan: MM Digital Screening  Need for influenza vaccination  -I will order flu vaccine and mammogram that she is due for. -She will update rest of vaccines at her next visit and at the pharmacy. -Her blood pressure is well controlled on 50 mg of Cozaar. -Her GERD is well controlled on occasional PPI therapy. -She was diagnosed with osteoporosis but has elected not to treat for now, willing to reconsider if needed. -She takes alprazolam 0.25 mg about once a week for assistance with  sleep. -She does have hyperlipidemia with a total cholesterol of 260 and LDL of 152 however this lab work is over 48 years old.  With aortic atherosclerosis and may consider starting on a statin pending results of updated labs.  Time spent: 32 minutes reviewing chart, interviewing and examining patient and formulating plan of care.       Lelon Frohlich, MD [facility]

## 2021-12-29 ENCOUNTER — Other Ambulatory Visit: Payer: Self-pay | Admitting: *Deleted

## 2021-12-29 MED ORDER — LOSARTAN POTASSIUM 50 MG PO TABS: 50.0000 mg | ORAL_TABLET | Freq: Every day | ORAL | 1 refills | Status: DC

## 2022-01-20 DIAGNOSIS — R202 Paresthesia of skin: Secondary | ICD-10-CM | POA: Diagnosis not present

## 2022-02-02 ENCOUNTER — Ambulatory Visit
Admission: RE | Admit: 2022-02-02 | Discharge: 2022-02-02 | Disposition: A | Discharge status: Home / Self Care | Payer: Medicare PPO | Source: Ambulatory Visit | Attending: Internal Medicine | Admitting: Internal Medicine

## 2022-02-02 ENCOUNTER — Encounter: Payer: Self-pay | Admitting: Pulmonary Disease

## 2022-02-02 DIAGNOSIS — Z1231 Encounter for screening mammogram for malignant neoplasm of breast: Secondary | ICD-10-CM

## 2022-03-10 ENCOUNTER — Encounter: Payer: Self-pay | Admitting: Pulmonary Disease

## 2022-03-10 ENCOUNTER — Encounter: Payer: Self-pay | Admitting: Internal Medicine

## 2022-03-10 ENCOUNTER — Ambulatory Visit (INDEPENDENT_AMBULATORY_CARE_PROVIDER_SITE_OTHER): Payer: Medicare PPO | Admitting: Internal Medicine

## 2022-03-10 VITALS — BP 118/78 | HR 96 | Temp 98.0°F | Ht 67.0 in | Wt 133.0 lb

## 2022-03-10 DIAGNOSIS — M810 Age-related osteoporosis without current pathological fracture: Secondary | ICD-10-CM | POA: Diagnosis not present

## 2022-03-10 DIAGNOSIS — R200 Anesthesia of skin: Secondary | ICD-10-CM | POA: Diagnosis not present

## 2022-03-10 DIAGNOSIS — I700 Atherosclerosis of aorta: Secondary | ICD-10-CM | POA: Diagnosis not present

## 2022-03-10 DIAGNOSIS — G4700 Insomnia, unspecified: Secondary | ICD-10-CM | POA: Diagnosis not present

## 2022-03-10 DIAGNOSIS — E782 Mixed hyperlipidemia: Secondary | ICD-10-CM

## 2022-03-10 DIAGNOSIS — R202 Paresthesia of skin: Secondary | ICD-10-CM | POA: Diagnosis not present

## 2022-03-10 DIAGNOSIS — F419 Anxiety disorder, unspecified: Secondary | ICD-10-CM | POA: Diagnosis not present

## 2022-03-10 LAB — LIPID PANEL
Cholesterol: 213 mg/dL — ABNORMAL HIGH (ref 0–200)
HDL: 70 mg/dL (ref >39.00)
LDL Cholesterol: 127 mg/dL — ABNORMAL HIGH (ref 0–99)
NonHDL: 142.77
Total CHOL/HDL Ratio: 3
Triglycerides: 77 mg/dL (ref 0.0–149.0)
VLDL: 15.4 mg/dL (ref 0.0–40.0)

## 2022-03-10 LAB — COMPREHENSIVE METABOLIC PANEL
ALT: 17 U/L (ref 0–35)
AST: 18 U/L (ref 0–37)
Albumin: 4.7 g/dL (ref 3.5–5.2)
Alkaline Phosphatase: 91 U/L (ref 39–117)
BUN: 12 mg/dL (ref 6–23)
CO2: 27 mEq/L (ref 19–32)
Calcium: 10.2 mg/dL (ref 8.4–10.5)
Chloride: 100 mEq/L (ref 96–112)
Creatinine, Ser: 0.83 mg/dL (ref 0.40–1.20)
GFR: 72.71 mL/min (ref >60.00)
Glucose, Bld: 104 mg/dL — ABNORMAL HIGH (ref 70–99)
Potassium: 4.5 mEq/L (ref 3.5–5.1)
Sodium: 137 mEq/L (ref 135–145)
Total Bilirubin: 0.6 mg/dL (ref 0.2–1.2)
Total Protein: 7.4 g/dL (ref 6.0–8.3)

## 2022-03-10 LAB — CBC WITH DIFFERENTIAL/PLATELET
Basophils Absolute: 0.1 10*3/uL (ref 0.0–0.1)
Basophils Relative: 0.8 % (ref 0.0–3.0)
Eosinophils Absolute: 0.3 10*3/uL (ref 0.0–0.7)
Eosinophils Relative: 4.2 % (ref 0.0–5.0)
HCT: 39.2 % (ref 36.0–46.0)
Hemoglobin: 13.3 g/dL (ref 12.0–15.0)
Lymphocytes Relative: 38.6 % (ref 12.0–46.0)
Lymphs Abs: 2.8 10*3/uL (ref 0.7–4.0)
MCHC: 33.9 g/dL (ref 30.0–36.0)
MCV: 93 fl (ref 78.0–100.0)
Monocytes Absolute: 0.7 10*3/uL (ref 0.1–1.0)
Monocytes Relative: 9.3 % (ref 3.0–12.0)
Neutro Abs: 3.4 10*3/uL (ref 1.4–7.7)
Neutrophils Relative %: 47.1 % (ref 43.0–77.0)
Platelets: 409 10*3/uL — ABNORMAL HIGH (ref 150.0–400.0)
RBC: 4.21 Mil/uL (ref 3.87–5.11)
RDW: 14 % (ref 11.5–15.5)
WBC: 7.2 10*3/uL (ref 4.0–10.5)

## 2022-03-10 LAB — TSH: TSH: 5.45 u[IU]/mL (ref 0.35–5.50)

## 2022-03-10 LAB — VITAMIN B12: Vitamin B-12: 851 pg/mL (ref 211–911)

## 2022-03-10 LAB — T3, FREE: T3, Free: 3.8 pg/mL (ref 2.3–4.2)

## 2022-03-10 LAB — VITAMIN D 25 HYDROXY (VIT D DEFICIENCY, FRACTURES): VITD: 76.41 ng/mL (ref 30.00–100.00)

## 2022-03-10 LAB — T4, FREE: Free T4: 0.86 ng/dL (ref 0.60–1.60)

## 2022-03-10 MED ORDER — ALPRAZOLAM 0.25 MG PO TABS: 0.2500 mg | ORAL_TABLET | Freq: Two times a day (BID) | ORAL | 2 refills | Status: DC | PRN

## 2022-03-10 NOTE — Progress Notes (Signed)
Established Patient Office Visit     CC/Reason for Visit: Follow-up chronic conditions, medication refills  HPI: Donna Hawkins is a 67 y.o. female who is coming in today for the above mentioned reasons. Past Medical History is significant for: Osteoporosis, insomnia, hypertension, GERD.  She is requesting a refill of alprazolam that she takes for her sleeping.  She has been chronically having some numbness and tingling of both feet.  She had an annual wellness visit last summer.  She has been noted to have aortic atherosclerosis.  Lipid panel from 2021 showed a total cholesterol of 268 with LDL of 152 and triglycerides of 91.  She has a rash of her left upper back that is pruritic.  She has been on multiple steroid creams.  She has an appointment with dermatology in 2 weeks.   Past Medical/Surgical History: Past Medical History:  Diagnosis Date   Anxiety    Appendicitis 09/25/2013   Basal cell carcinoma    Blood in stool    Chicken pox    Dysphagia    GERD (gastroesophageal reflux disease)    High blood pressure    HLD (hyperlipidemia)    Mitral valve prolapse     Past Surgical History:  Procedure Laterality Date   APPENDECTOMY  2015   Open appendectomy with right colectomy   BREAST BIOPSY Right 2001   benign   COLON SURGERY  2015   right hemicolectomy done at time of appendectomy   LAPAROSCOPIC APPENDECTOMY N/A 09/25/2013   Procedure:  LAPAROSCOPIC APPENDECTOMY CONVERTED TO LAPAROSCOPIC ASSISTED RIGHT COLECTOMY;  Surgeon: Gayland Curry, MD;  Location: [facility];  Service: General;  Laterality: N/A;   TONSILLECTOMY      Social History:  reports that she has never smoked. She has never used smokeless tobacco. She reports current alcohol use. She reports that she does not use drugs.  Allergies: No Known Allergies  Family History:  Family History  Problem Relation Age of Onset   Stroke Mother 46       brain hemorrhage   Irritable bowel syndrome Mother    COPD  Father    Ovarian cancer Maternal Grandmother    Heart disease Paternal Grandfather 40       MI   Hypertension Daughter    Autoimmune disease Neg Hx    Colon cancer Neg Hx    Rectal cancer Neg Hx    Stomach cancer Neg Hx    Esophageal cancer Neg Hx      Current Outpatient Medications:    ALPRAZolam (XANAX) 0.25 MG tablet, Take 1 tablet (0.25 mg total) by mouth 2 (two) times daily as needed for anxiety or sleep., Disp: 60 tablet, Rfl: 2   Cholecalciferol (VITAMIN D3 PO), Take 2,000 Units by mouth daily., Disp: , Rfl:    losartan (COZAAR) 50 MG tablet, Take 1 tablet (50 mg total) by mouth daily., Disp: 90 tablet, Rfl: 1   Magnesium 300 MG CAPS, Take 1 capsule by mouth daily., Disp: , Rfl:    Menaquinone-7 (VITAMIN K2 PO), Take by mouth daily., Disp: , Rfl:    Omega-3 Fatty Acids (FISH OIL PO), Take 1 capsule by mouth daily., Disp: , Rfl:    OVER THE COUNTER MEDICATION, Vitamin code Raw calcium, Disp: , Rfl:    OVER THE COUNTER MEDICATION, My Kind Organic plant calcium, Disp: , Rfl:    omeprazole (PRILOSEC) 20 MG capsule, Take 25 mg by mouth daily. Take as needed. (Patient not taking: Reported  on 03/09/2022), Disp: , Rfl:   Review of Systems:  Constitutional: Denies fever, chills, diaphoresis, appetite change and fatigue.  HEENT: Denies photophobia, eye pain, redness, hearing loss, ear pain, congestion, sore throat, rhinorrhea, sneezing, mouth sores, trouble swallowing, neck pain, neck stiffness and tinnitus.   Respiratory: Denies cough, chest tightness,  and wheezing.   Cardiovascular: Denies chest pain, palpitations and leg swelling.  Gastrointestinal: Denies nausea, vomiting, abdominal pain, diarrhea, constipation, blood in stool and abdominal distention.  Genitourinary: Denies dysuria, urgency, frequency, hematuria, flank pain and difficulty urinating.  Endocrine: Denies: hot or cold intolerance, sweats, changes in hair or nails, polyuria, polydipsia. Musculoskeletal: Denies myalgias,  back pain, joint swelling, arthralgias and gait problem.  Skin: Denies pallor and wound.  Neurological: Denies dizziness, seizures, syncope, weakness, light-headedness, numbness and headaches.  Hematological: Denies adenopathy. Easy bruising, personal or family bleeding history  Psychiatric/Behavioral: Denies suicidal ideation, mood changes, confusion, nervousness, sleep disturbance and agitation    Physical Exam: Vitals:   03/10/22 0754  BP: 118/78  Pulse: 96  Temp: 98 F (36.7 C)  TempSrc: Oral  SpO2: 98%  Weight: 133 lb (60.3 kg)  Height: '5\' 7"'$  (1.702 m)    Body mass index is 20.83 kg/m.   Constitutional: NAD, calm, comfortable Eyes: PERRL, lids and conjunctivae normal ENMT: Mucous membranes are moist.  Respiratory: clear to auscultation bilaterally, no wheezing, no crackles. Normal respiratory effort. No accessory muscle use.  Cardiovascular: Regular rate and rhythm, no murmurs / rubs / gallops. No extremity edema.   Psychiatric: Normal judgment and insight. Alert and oriented x 3. Normal mood.    Impression and Plan:  Anxiety  Age-related osteoporosis without current pathological fracture  Numbness and tingling of foot - Plan: TSH, T4, free, T3, free, Vitamin B12, VITAMIN D 25 Hydroxy (Vit-D Deficiency, Fractures)  Insomnia, unspecified type  Mixed hyperlipidemia - Plan: CBC with Differential/Platelet, Comprehensive metabolic panel, Lipid panel  Aortic atherosclerosis (HCC)  -Labs to be done today, I am particularly concerned about her hyperlipidemia in the face of aortic atherosclerosis.  We have discussed potentially starting statin medication pending results. -She has had numbness and tingling of both feet.  She was noted to have a high normal TSH when last checked in 2021.  Check thyroid function studies, vitamin B12 and vitamin D. -All cancer screening is up-to-date. -She has been asked to obtain RSV and shingles vaccinations at pharmacy. -She takes Xanax  intermittently for insomnia, refill requested today.  Time spent:34 minutes reviewing chart, interviewing and examining patient and formulating plan of care.       Lelon Frohlich, MD [facility]

## 2022-03-23 DIAGNOSIS — L309 Dermatitis, unspecified: Secondary | ICD-10-CM | POA: Diagnosis not present

## 2022-03-23 DIAGNOSIS — L989 Disorder of the skin and subcutaneous tissue, unspecified: Secondary | ICD-10-CM | POA: Diagnosis not present

## 2022-03-25 DIAGNOSIS — L309 Dermatitis, unspecified: Secondary | ICD-10-CM | POA: Diagnosis not present

## 2022-03-31 ENCOUNTER — Other Ambulatory Visit: Payer: Medicare PPO

## 2022-03-31 DIAGNOSIS — J984 Other disorders of lung: Secondary | ICD-10-CM | POA: Diagnosis not present

## 2022-03-31 DIAGNOSIS — M2550 Pain in unspecified joint: Secondary | ICD-10-CM

## 2022-04-04 LAB — ANA: Anti Nuclear Antibody (ANA): NEGATIVE

## 2022-04-04 LAB — CYCLIC CITRUL PEPTIDE ANTIBODY, IGG: Cyclic Citrullin Peptide Ab: <16 UNITS

## 2022-04-04 LAB — CENTROMERE ANTIBODIES: Centromere Ab Screen: <1 AI

## 2022-04-04 LAB — ANTI-SMITH ANTIBODY: ENA SM Ab Ser-aCnc: <1 AI

## 2022-04-04 LAB — ANCA SCREEN W REFLEX TITER: ANCA SCREEN: NEGATIVE

## 2022-04-04 LAB — ANTI-DNA ANTIBODY, DOUBLE-STRANDED: ds DNA Ab: <1 IU/mL

## 2022-04-08 DIAGNOSIS — R897 Abnormal histological findings in specimens from other organs, systems and tissues: Secondary | ICD-10-CM | POA: Diagnosis not present

## 2022-04-30 ENCOUNTER — Other Ambulatory Visit (INDEPENDENT_AMBULATORY_CARE_PROVIDER_SITE_OTHER): Payer: Medicare PPO

## 2022-04-30 ENCOUNTER — Encounter: Payer: Self-pay | Admitting: Gastroenterology

## 2022-04-30 ENCOUNTER — Ambulatory Visit: Payer: Medicare PPO | Admitting: Gastroenterology

## 2022-04-30 VITALS — BP 138/90 | HR 108 | Ht 67.0 in | Wt 135.1 lb

## 2022-04-30 DIAGNOSIS — R110 Nausea: Secondary | ICD-10-CM | POA: Diagnosis not present

## 2022-04-30 DIAGNOSIS — R1084 Generalized abdominal pain: Secondary | ICD-10-CM

## 2022-04-30 LAB — COMPREHENSIVE METABOLIC PANEL
ALT: 18 U/L (ref 0–35)
AST: 18 U/L (ref 0–37)
Albumin: 4.2 g/dL (ref 3.5–5.2)
Alkaline Phosphatase: 92 U/L (ref 39–117)
BUN: 10 mg/dL (ref 6–23)
CO2: 26 mEq/L (ref 19–32)
Calcium: 10 mg/dL (ref 8.4–10.5)
Chloride: 102 mEq/L (ref 96–112)
Creatinine, Ser: 0.74 mg/dL (ref 0.40–1.20)
GFR: 83.37 mL/min (ref >60.00)
Glucose, Bld: 95 mg/dL (ref 70–99)
Potassium: 3.7 mEq/L (ref 3.5–5.1)
Sodium: 137 mEq/L (ref 135–145)
Total Bilirubin: 1 mg/dL (ref 0.2–1.2)
Total Protein: 6.9 g/dL (ref 6.0–8.3)

## 2022-04-30 LAB — LIPASE: Lipase: 22 U/L (ref 11.0–59.0)

## 2022-04-30 NOTE — Patient Instructions (Addendum)
Your provider has requested that you go to the basement level for lab work before leaving today. Press "B" on the elevator. The lab is located at the first door on the left as you exit the elevator.  We have sent the following medications to your pharmacy for you to pick up at your convenience: Famotidine 20 mg twice daily. Stop Omeprazole.   You have been scheduled for a CT scan of the abdomen and pelvis at [facility], 1st floor Radiology. You are scheduled on Thursday 05/14/22 at 2:30. You should arrive 15 minutes prior to your appointment time for registration.  The CT scan will take place at 4:30 pm.  Please follow the written instructions below on the day of your exam:   1) Do not eat anything after 12:30 pm (4 hours prior to your test)   You may take any medications as prescribed with a small amount of water, if necessary. If you take any of the following medications: METFORMIN, GLUCOPHAGE, GLUCOVANCE, AVANDAMET, RIOMET, FORTAMET, [place] MET, JANUMET, GLUMETZA or METAGLIP, you MAY be asked to HOLD this medication 48 hours AFTER the exam.   The purpose of you drinking the oral contrast is to aid in the visualization of your intestinal tract. The contrast solution may cause some diarrhea. Depending on your individual set of symptoms, you may also receive an intravenous injection of x-ray contrast/dye. Plan on being at [facility] for 45 minutes or longer, depending on the type of exam you are having performed.   If you have any questions regarding your exam or if you need to reschedule, you may call [facility] at [phone] between the hours of 8:00 am and 5:00 pm, Monday-Friday.

## 2022-04-30 NOTE — Progress Notes (Signed)
Referring Provider: Isaac Bliss, [facility]* Primary Care Physician:  Isaac Bliss, Rayford Halsted, MD  Chief Complaint: Abdominal pain and nausea   IMPRESSION:  Acute on chronic abdominal pain with some feature different from her chronic abdominal burning    - last abdominal imaging 2015    - last endoscopic evaluation 2021 History of gastritis History of dysphagia History of unexplained GI bleed 20 years ago    -Colonoscopy and EGD at that time did not reveal a source Hypertensive upper cricopharyngeal    - s/p botox injection at [place]  Severe pill dysphagia Colon polyps    -2 tubular adenomas on colonoscopy 04/17/2019    -Surveillance colonoscopy recommended in 7 years Wants to avoid long-term medications Appendectomy and colon resection 2015  PLAN: - Substitute PPI with famotidine 20 mg BID - CMP, lipase - CT abd/pelvis with contrast to evaluate her pancreas - EGD if CT is negative - Consider evaluation for symptomatic gallbladder disease if this evaluation is negative - Call with escalating symptoms in the meantime  HPI: Donna Hawkins is a 67 y.o. female she was last seen in 2021 for abdominal pain.   The referral history is obtained through the patient and review of her electronic health record. She has hypercholesterolemia, osteoporosis, insomnia, hypertension, mild mitral valve prolapse, anxiety.  Please see my office note from 04/03/2019 for details regarding extensive GI history for unexplained GI bleeding, gastritis, dysphagia treated with upper cricopharyngeal Botox injections, acute appendicitis, and globus.  At the time of her visit in 2021 she was reporting 10 years of "Stomach burning" further described as notting in the stomach.  Associated chest tightness, early satiety, bloating, and rarely heartburn.  With symptoms occurring every weeks and lasting for about an hour. Initially diagnosed with gastritis on EGD with Dr. Rose Fillers 2008.  She also reported a lump  in her throat present since the onset of her dysphagia symptoms in 2011.  It was described as an intermittent, nonpainful sensation of a lump or foreign body in the throat that occurs between meals. Intermittent heartburn and regurgitation. Occasionally feels hoarse. There is no pain, lateralization of the symptoms, dysphagia, odynophagia, or weight loss.  Did not find that a PPI helped her symptoms.  Presents now with acute on chronic symptoms with ongoing generalized but predominantly mid abdominal pain that has worsened over the last 6 weeks with associated nausea. This pain does not have the burning sensation as predominantly as her chronic GI pain does. Symptoms are disrupting her sleep. Not worsened or improved by eating, movement, or defecation. Appetite is good. Weight is stable. Some improvement with PeptoBismal and omeprazole PRN. No improvement with TUMS. No change in bowel habits, heartburn, dysphagia, or odynophagia.   She is concerned about taking omeprazole given her osteoporosis.   Mother and maternal grandmother had "stomach trouble." No known family history of GI cancer.   Labs 03/10/22: normal CMP except for glucose 104, normal CBC except 409  Prior abdominal imaging: - CT abd/pelvis 2015 showed acute appendicitis and suspicion for a possible underlying mucinous neoplasm of the appendix.  Endoscopic history: - EGD 04/17/2019: LA grade a reflux esophagitis. There was mild gastritis with no H. pylori.  Esophageal biopsies showed mild reflux changes.  There was no EOE.  - Colonoscopy 04/17/2019: Left-sided diverticulosis 3 small polyps in the descending colon, hepatic flexure, and a sending colon, nonbleeding internal hemorrhoids, and a normal healthy-appearing ileocolonic anastomosis.  2 of the polyps were tubular adenomas.  The third  was a benign lymphoid aggregate.  Past Medical History:  Diagnosis Date   Anxiety    Appendicitis 09/25/2013   Basal cell carcinoma    Blood in  stool    Chicken pox    Dysphagia    GERD (gastroesophageal reflux disease)    High blood pressure    HLD (hyperlipidemia)    Mitral valve prolapse    Osteoporosis     Past Surgical History:  Procedure Laterality Date   APPENDECTOMY  2015   Open appendectomy with right colectomy   BREAST BIOPSY Right 2001   benign   COLON SURGERY  2015   right hemicolectomy done at time of appendectomy   LAPAROSCOPIC APPENDECTOMY N/A 09/25/2013   Procedure:  LAPAROSCOPIC APPENDECTOMY CONVERTED TO LAPAROSCOPIC ASSISTED RIGHT COLECTOMY;  Surgeon: Gayland Curry, MD;  Location: [facility];  Service: General;  Laterality: N/A;   TONSILLECTOMY      Current Outpatient Medications  Medication Sig Dispense Refill   ALPRAZolam (XANAX) 0.25 MG tablet Take 1 tablet (0.25 mg total) by mouth 2 (two) times daily as needed for anxiety or sleep. 60 tablet 2   ascorbic acid (VITAMIN C) 500 MG tablet Take 500 mg by mouth daily.     bismuth subsalicylate (PEPTO BISMOL) 262 MG/15ML suspension Take 30 mLs by mouth at bedtime.     CALCIUM-VITAMIN D PO Take 1 capsule by mouth 3 (three) times daily.     Cholecalciferol (VITAMIN D3 PO) Take 2,000 Units by mouth daily.     losartan (COZAAR) 50 MG tablet Take 1 tablet (50 mg total) by mouth daily. 90 tablet 1   Magnesium 300 MG CAPS Take 1 capsule by mouth daily.     Menaquinone-7 (VITAMIN K2 PO) Take by mouth daily.     Omega-3 Fatty Acids (FISH OIL PO) Take 1 capsule by mouth daily.     omeprazole (PRILOSEC) 20 MG capsule Take 25 mg by mouth daily. Take as needed.     OVER THE COUNTER MEDICATION Vitamin code Raw calcium     OVER THE COUNTER MEDICATION My Kind Organic plant calcium     No current facility-administered medications for this visit.    Allergies as of 04/30/2022   (No Known Allergies)    Family History  Problem Relation Age of Onset   Stroke Mother 42       brain hemorrhage   Irritable bowel syndrome Mother    Hyperlipidemia Mother    COPD Father     Ovarian cancer Maternal Grandmother    Diabetes Paternal Grandmother    Heart disease Paternal Grandfather 27       MI   Hypertension Daughter    Hypertension Son    Autoimmune disease Neg Hx    Colon cancer Neg Hx    Rectal cancer Neg Hx    Stomach cancer Neg Hx    Esophageal cancer Neg Hx       Physical Exam: General:   Alert,  well-nourished, pleasant and cooperative in NAD Head:  Normocephalic and atraumatic. Eyes:  Sclera clear, no icterus.   Conjunctiva pink. Abdomen:  Soft,nontender, nondistended, normal bowel sounds, no rebound or guarding. No hepatosplenomegaly.   Neurologic:  Alert and  oriented x4;  grossly nonfocal Skin:  Intact without significant lesions or rashes. Psych:  Alert and cooperative. Normal mood and affect.  I spent 47  minutes, including in depth chart review, independent review of results, communicating results with the patient directly, face-to-face time with the patient, coordinating  care, ordering studies and medications as appropriate, and documentation.     Brittiny Levitz L. Tarri Glenn, MD, MPH 04/30/2022, 9:54 AM

## 2022-05-14 ENCOUNTER — Ambulatory Visit (HOSPITAL_COMMUNITY)
Admission: RE | Admit: 2022-05-14 | Discharge: 2022-05-14 | Disposition: A | Discharge status: Home / Self Care | Payer: Medicare PPO | Source: Ambulatory Visit | Attending: Gastroenterology | Admitting: Gastroenterology

## 2022-05-14 DIAGNOSIS — R1084 Generalized abdominal pain: Secondary | ICD-10-CM | POA: Diagnosis not present

## 2022-05-14 DIAGNOSIS — R110 Nausea: Secondary | ICD-10-CM | POA: Insufficient documentation

## 2022-05-14 DIAGNOSIS — R109 Unspecified abdominal pain: Secondary | ICD-10-CM | POA: Diagnosis not present

## 2022-05-14 DIAGNOSIS — I700 Atherosclerosis of aorta: Secondary | ICD-10-CM | POA: Diagnosis not present

## 2022-05-14 MED ORDER — IOHEXOL 9 MG/ML PO SOLN
1000.0000 mL | ORAL | Status: AC
  Administered 2022-05-14: 1000 mL via ORAL

## 2022-05-14 MED ORDER — IOHEXOL 300 MG/ML  SOLN
80.0000 mL | Freq: Once | INTRAMUSCULAR | Status: AC | PRN
  Administered 2022-05-14: 80 mL via INTRAVENOUS

## 2022-05-14 MED ORDER — IOHEXOL 9 MG/ML PO SOLN
ORAL | Status: AC
  Filled 2022-05-14: qty 1000

## 2022-05-19 ENCOUNTER — Telehealth: Payer: Self-pay | Admitting: *Deleted

## 2022-05-19 ENCOUNTER — Other Ambulatory Visit: Payer: Self-pay | Admitting: *Deleted

## 2022-05-19 DIAGNOSIS — R1084 Generalized abdominal pain: Secondary | ICD-10-CM

## 2022-05-19 DIAGNOSIS — R110 Nausea: Secondary | ICD-10-CM

## 2022-05-19 NOTE — Telephone Encounter (Signed)
-----   Message from Thornton Park, MD sent at 05/18/2022  5:03 PM EDT ----- CT does not explain her abdominal pain and nausea.  Please schedule an EGD as discussed at the time of her last office visit.  Thank you.

## 2022-05-19 NOTE — Telephone Encounter (Signed)
Informed patient of results. EGD scheduled for 05/25/22 @ 2:30 pm. Instructions sent via mychart.

## 2022-05-25 ENCOUNTER — Encounter: Payer: Self-pay | Admitting: Gastroenterology

## 2022-05-25 ENCOUNTER — Ambulatory Visit (AMBULATORY_SURGERY_CENTER): Payer: Medicare PPO | Admitting: Gastroenterology

## 2022-05-25 VITALS — BP 131/70 | HR 86 | Temp 98.1°F | Resp 20 | Ht 67.0 in | Wt 135.0 lb

## 2022-05-25 DIAGNOSIS — K2100 Gastro-esophageal reflux disease with esophagitis, without bleeding: Secondary | ICD-10-CM | POA: Diagnosis not present

## 2022-05-25 DIAGNOSIS — R1084 Generalized abdominal pain: Secondary | ICD-10-CM | POA: Diagnosis not present

## 2022-05-25 DIAGNOSIS — K449 Diaphragmatic hernia without obstruction or gangrene: Secondary | ICD-10-CM | POA: Diagnosis not present

## 2022-05-25 MED ORDER — FAMOTIDINE 20 MG PO TABS: 20.0000 mg | ORAL_TABLET | Freq: Two times a day (BID) | ORAL | 3 refills | Status: DC

## 2022-05-25 MED ORDER — SODIUM CHLORIDE 0.9 % IV SOLN: 500.0000 mL | Freq: Once | INTRAVENOUS | Status: DC

## 2022-05-25 NOTE — Progress Notes (Signed)
Pt resting comfortably. VSS. Airway intact. SBAR complete to RN. All questions answered.   

## 2022-05-25 NOTE — Progress Notes (Signed)
Indications for upper endoscopy: Acute on chronic abdominal pain and nausea  Please see my 04/30/2022 office note for complete details.  There is been no significant change in history or physical exam since that time.  She remains an appropriate candidate for monitored anesthesia care in the [place].

## 2022-05-25 NOTE — Op Note (Signed)
[facility] Patient Name: Donna Hawkins Procedure Date: 05/25/2022 1:52 PM MRN: [id number] Endoscopist: Thornton Park MD, MD, [id number] Age: 67 Referring MD:  Date of Birth: [birth year] Gender: Female Account #: [id number] Procedure:                Upper GI endoscopy Indications:              Upper abdominal pain, Nausea Medicines:                Monitored Anesthesia Care Procedure:                Pre-Anesthesia Assessment:                           - Prior to the procedure, a History and Physical                            was performed, and patient medications and                            allergies were reviewed. The patient's tolerance of                            previous anesthesia was also reviewed. The risks                            and benefits of the procedure and the sedation                            options and risks were discussed with the patient.                            All questions were answered, and informed consent                            was obtained. Prior Anticoagulants: The patient has                            taken no anticoagulant or antiplatelet agents. ASA                            Grade Assessment: III - A patient with severe                            systemic disease. After reviewing the risks and                            benefits, the patient was deemed in satisfactory                            condition to undergo the procedure.                           After obtaining informed consent, the endoscope was  passed under direct vision. Throughout the                            procedure, the patient's blood pressure, pulse, and                            oxygen saturations were monitored continuously. The                            GIF [id number] was introduced through the                            mouth, and advanced to the second part of duodenum.                            The upper GI  endoscopy was accomplished without                            difficulty. The patient tolerated the procedure                            well. Scope In: Scope Out: Findings:                 LA Grade A (one or more mucosal breaks less than 5                            mm, not extending between tops of 2 mucosal folds)                            esophagitis with no bleeding was found 38 cm from                            the incisors. Biopsies were taken from the                            mid/proximal and distal esophagus with a cold                            forceps for histology. Estimated blood loss was                            minimal.                           A small hiatal hernia was present.                           The entire examined stomach was otherwise normal.                            Biopsies were taken from the antrum, body, and  fundus with a cold forceps for histology. Estimated                            blood loss was minimal.                           The examined duodenum was normal. Biopsies were                            taken with a cold forceps for histology. Estimated                            blood loss was minimal.                           The cardia and gastric fundus were normal on                            retroflexion. Complications:            No immediate complications. Estimated Blood Loss:     Estimated blood loss was minimal. Impression:               - LA Grade A reflux esophagitis with no bleeding.                            Biopsied.                           - Normal stomach. Biopsied.                           - Normal examined duodenum. Biopsied. Recommendation:           - Patient has a contact number available for                            emergencies. The signs and symptoms of potential                            delayed complications were discussed with the                            patient. Return to  normal activities tomorrow.                            Written discharge instructions were provided to the                            patient.                           - Resume previous diet.                           - Continue present medications.                           -  Famtodine 20 mg BID.                           - Reflux lifestyle modifications.                           - Await pathology results.                           - Office follow-up with 4-6 weeks, earlier if                            needed. Thornton Park MD, MD 05/25/2022 2:32:12 PM This report has been signed electronically.

## 2022-05-25 NOTE — Progress Notes (Signed)
Called to room to assist during endoscopic procedure.  Patient ID and intended procedure confirmed with present staff. Received instructions for my participation in the procedure from the performing physician.  

## 2022-05-25 NOTE — Patient Instructions (Signed)
Resume previous diet and medications. Use Famotidine 20 MG BID. Reflux lifestyle modifications. Awaiting pathology results. Office follow up with 4-6 weeks, earlier if needed.  Handout provided on Hiatal Hernia and Esophagitis  YOU HAD AN ENDOSCOPIC PROCEDURE TODAY AT [facility]:   Refer to the procedure report that was given to you for any specific questions about what was found during the examination.  If the procedure report does not answer your questions, please call your gastroenterologist to clarify.  If you requested that your care partner not be given the details of your procedure findings, then the procedure report has been included in a sealed envelope for you to review at your convenience later.  YOU SHOULD EXPECT: Some feelings of bloating in the abdomen. Passage of more gas than usual.  Walking can help get rid of the air that was put into your GI tract during the procedure and reduce the bloating. If you had a lower endoscopy (such as a colonoscopy or flexible sigmoidoscopy) you may notice spotting of blood in your stool or on the toilet paper. If you underwent a bowel prep for your procedure, you may not have a normal bowel movement for a few days.  Please Note:  You might notice some irritation and congestion in your nose or some drainage.  This is from the oxygen used during your procedure.  There is no need for concern and it should clear up in a day or so.  SYMPTOMS TO REPORT IMMEDIATELY:  Following upper endoscopy (EGD)  Vomiting of blood or coffee ground material  New chest pain or pain under the shoulder blades  Painful or persistently difficult swallowing  New shortness of breath  Fever of 100F or higher  Black, tarry-looking stools  For urgent or emergent issues, a gastroenterologist can be reached at any hour by calling [phone]. Do not use MyChart messaging for urgent concerns.    DIET:  We do recommend a small meal at first, but then you may  proceed to your regular diet.  Drink plenty of fluids but you should avoid alcoholic beverages for 24 hours.  ACTIVITY:  You should plan to take it easy for the rest of today and you should NOT DRIVE or use heavy machinery until tomorrow (because of the sedation medicines used during the test).    FOLLOW UP: Our staff will call the number listed on your records the next business day following your procedure.  We will call around 7:15- 8:00 am to check on you and address any questions or concerns that you may have regarding the information given to you following your procedure. If we do not reach you, we will leave a message.     If any biopsies were taken you will be contacted by phone or by letter within the next 1-3 weeks.  Please call us at [phone] if you have not heard about the biopsies in 3 weeks.    SIGNATURES/CONFIDENTIALITY: You and/or your care partner have signed paperwork which will be entered into your electronic medical record.  These signatures attest to the fact that that the information above on your After Visit Summary has been reviewed and is understood.  Full responsibility of the confidentiality of this discharge information lies with you and/or your care-partner.

## 2022-05-26 ENCOUNTER — Telehealth: Payer: Self-pay

## 2022-05-26 NOTE — Telephone Encounter (Signed)
  Follow up Call-     05/25/2022    1:57 PM  Call back number  Post procedure Call Back phone  # [phone]  Permission to leave phone message Yes     Patient questions:  Do you have a fever, pain , or abdominal swelling? No. Pain Score  0 *  Have you tolerated food without any problems? Yes.    Have you been able to return to your normal activities? Yes.    Do you have any questions about your discharge instructions: Diet   No. Medications  No. Follow up visit  No.  Do you have questions or concerns about your Care? No.  Actions: * If pain score is 4 or above: No action needed, pain <4.

## 2022-05-27 ENCOUNTER — Telehealth: Payer: Self-pay | Admitting: *Deleted

## 2022-05-27 NOTE — Telephone Encounter (Signed)
-----   Message from Thornton Park, MD sent at 05/25/2022  2:27 PM EDT ----- Office visit in 4-6 weeks. Thanks.  KLB

## 2022-06-02 ENCOUNTER — Encounter: Payer: Self-pay | Admitting: Gastroenterology

## 2022-06-22 DIAGNOSIS — R202 Paresthesia of skin: Secondary | ICD-10-CM | POA: Diagnosis not present

## 2022-06-26 ENCOUNTER — Other Ambulatory Visit: Payer: Self-pay | Admitting: Internal Medicine

## 2022-07-16 ENCOUNTER — Ambulatory Visit: Payer: Medicare PPO | Admitting: Nurse Practitioner

## 2022-07-16 ENCOUNTER — Encounter: Payer: Self-pay | Admitting: Nurse Practitioner

## 2022-07-16 VITALS — BP 138/100 | HR 79 | Ht 67.0 in | Wt 131.0 lb

## 2022-07-16 DIAGNOSIS — R1013 Epigastric pain: Secondary | ICD-10-CM

## 2022-07-16 MED ORDER — OMEPRAZOLE 20 MG PO CPDR: 20.0000 mg | DELAYED_RELEASE_CAPSULE | Freq: Every day | ORAL | 6 refills | Status: DC

## 2022-07-16 MED ORDER — SUCRALFATE 1 G PO TABS: 1.0000 g | ORAL_TABLET | Freq: Every day | ORAL | 3 refills | Status: DC

## 2022-07-16 NOTE — Progress Notes (Unsigned)
Assessment and Plan   Primary GI: Previously Donna Danas, MD  Brief Narrative:  67 y.o. yo female whose past medical history includes but is not necessarily limited to chronic abdominal pain, colon polyps, appendectomy, colon resection in 2015, osteoporosis  Chronic epigastric pain, worse at bedtime.  Unclear etiology. Unrevealing labs ( CMET, lipase, CBC), CT scan, and EGD. Feeling better back on PPI but concerned about taking Omeprazole with history of osteoporosis.  -Continue daily Omeprazole but will lower dose to 20 mg q am.  -Trial of Carafate 1 grams Q HS -follow up as needed    History of Present Illness   Chief complaint:  following up after EGD  Donna Hawkins has been followed by Dr. Orvan Falconer, she was seen on 04/30/2022 for evaluation of acute on chronic abdominal pain, please refer to that note for details.  In summary she was changed from PPI to famotidine.  Labs were obtained and unremarkable including a CMP and lipase.  CT scan of the abdomen and pelvis was done and unrevealing .  EGD was done and also unrevealing  Donna Hawkins is here for follow up after EGD. We had changed her from Omeprazole to Famoitdine. Famotidine didn't help so she resumed Omeprazole and is feeling feeling better but concerned about taking Omeprazole with history of osteoporosis. She has noticed the pain is more bothersome at night and can interfere with sleep. She  sometimes takes Pepto at night if needed and that is helpful. No signifcant nausea. No lower Gi complaints.    Her weight is stable .  Previous GI Endoscopies / Labs / Imaging    05/14/22 CT AP with contrast No acute abnormalities  Most recent endoscopic evaluation 05/25/22 EGD  - LA Grade A reflux esophagitis with no bleeding. Biopsied. - Normal stomach. Biopsied. - Normal examined duodenum. Biopsied.  Diagnosis 1. Surgical [P], duodenal biopsies - BENIGN SMALL BOWEL MUCOSA WITH NO SIGNIFICANT PATHOLOGIC CHANGES 2. Surgical [P], fundus,  gastric antrum, and gastric body - GASTRIC ANTRAL AND OXYNTIC MUCOSA WITH FEATURES OF MILD REACTIVE GASTROPATHY - NEGATIVE FOR H. PYLORI ON H&E STAIN - NEGATIVE FOR INTESTINAL METAPLASIA OR MALIGNANCY 3. Surgical [P], distal esophagus - BENIGN SQUAMOUS AND GASTRIC CARDIAC MUCOSA WITH NO SPECIFIC PATHOLOGIC CHANGES - NEGATIVE FOR INCREASED INTRAEPITHELIAL EOSINOPHILS - NEGATIVE FOR INTESTINAL METAPLASIA, DYSPLASIA OR MALIGNANCY 4. Surgical [P], mid esophagus and proximal esophagus - SQUAMOUS MUCOSA WITH NO SPECIFIC PATHOLOGIC CHANGES - NEGATIVE FOR INCREASED INTRAEPITHELIAL EOSINOPHILS - NEGATIVE FOR DYSPLASIA OR MALIGNANCY       Latest Ref Rng & Units 04/30/2022    9:45 AM 03/10/2022    8:23 AM 12/13/2019    9:59 AM  Hepatic Function  Total Protein 6.0 - 8.3 g/dL 6.9  7.4  7.3   Albumin 3.5 - 5.2 g/dL 4.2  4.7    AST 0 - 37 U/L 18  18  26    ALT 0 - 35 U/L 18  17  29    Alk Phosphatase 39 - 117 U/L 92  91    Total Bilirubin 0.2 - 1.2 mg/dL 1.0  0.6  0.7        Latest Ref Rng & Units 03/10/2022    8:23 AM 12/13/2019    9:59 AM 12/09/2018    2:10 PM  CBC  WBC 4.0 - 10.5 K/uL 7.2  7.9  8.6   Hemoglobin 12.0 - 15.0 g/dL 46.9  62.9  52.8   Hematocrit 36.0 - 46.0 % 39.2  37.8  39.5  Platelets 150.0 - 400.0 K/uL 409.0  397  383.0      Past Medical History:  Diagnosis Date   Anxiety    Appendicitis 09/25/2013   Basal cell carcinoma    Blood in stool    Chicken pox    Dysphagia    GERD (gastroesophageal reflux disease)    High blood pressure    HLD (hyperlipidemia)    Mitral valve prolapse    Osteoporosis     Past Surgical History:  Procedure Laterality Date   APPENDECTOMY  2015   Open appendectomy with right colectomy   BREAST BIOPSY Right 2001   benign   COLON SURGERY  2015   right hemicolectomy done at time of appendectomy   LAPAROSCOPIC APPENDECTOMY N/A 09/25/2013   Procedure:  LAPAROSCOPIC APPENDECTOMY CONVERTED TO LAPAROSCOPIC ASSISTED RIGHT COLECTOMY;   Surgeon: Atilano Ina, MD;  Location: [facility] OR;  Service: General;  Laterality: N/A;   TONSILLECTOMY      Current Medications, Allergies, Family History and Social History were reviewed in [employer] electronic medical record.     Current Outpatient Medications  Medication Sig Dispense Refill   ALPRAZolam (XANAX) 0.25 MG tablet Take 1 tablet (0.25 mg total) by mouth 2 (two) times daily as needed for anxiety or sleep. 60 tablet 2   ascorbic acid (VITAMIN C) 500 MG tablet Take 500 mg by mouth daily.     bismuth subsalicylate (PEPTO BISMOL) 262 MG/15ML suspension Take 30 mLs by mouth at bedtime.     CALCIUM-VITAMIN D PO Take 1 capsule by mouth 3 (three) times daily.     Cholecalciferol (VITAMIN D3 PO) Take 2,000 Units by mouth daily.     famotidine (PEPCID) 20 MG tablet Take 1 tablet (20 mg total) by mouth 2 (two) times daily. 90 tablet 3   losartan (COZAAR) 50 MG tablet TAKE 1 TABLET BY MOUTH EVERY DAY 90 tablet 1   Magnesium 300 MG CAPS Take 1 capsule by mouth daily.     Menaquinone-7 (VITAMIN K2 PO) Take by mouth daily.     Omega-3 Fatty Acids (FISH OIL PO) Take 1 capsule by mouth daily.     omeprazole (PRILOSEC) 20 MG capsule Take 25 mg by mouth daily. Take as needed. (Patient not taking: Reported on 05/25/2022)     OVER THE COUNTER MEDICATION Vitamin code Raw calcium     OVER THE COUNTER MEDICATION My Kind Organic plant calcium     No current facility-administered medications for this visit.    Review of Systems: No chest pain. No shortness of breath. No urinary complaints.    Physical Exam  Wt Readings from Last 3 Encounters:  05/25/22 135 lb (61.2 kg)  04/30/22 135 lb 2 oz (61.3 kg)  03/10/22 133 lb (60.3 kg)    BP (!) 138/100   Pulse 79   Ht 5\' 7"  (1.702 m)   Wt 131 lb (59.4 kg)   SpO2 99%   BMI 20.52 kg/m  Constitutional:  Pleasant, generally well appearing female in no acute distress. Psychiatric: Normal mood and affect. Behavior is normal. EENT: Pupils normal.   Conjunctivae are normal. No scleral icterus. Neck supple.  Cardiovascular: Normal rate, regular rhythm.  Pulmonary/chest: Effort normal and breath sounds normal. No wheezing, rales or rhonchi. Abdominal: Soft, nondistended, nontender. Bowel sounds active throughout. There are no masses palpable. No hepatomegaly. Neurological: Alert and oriented to person place and time.  Skin: Skin is warm and dry. No rashes noted.  Willette Cluster, NP  07/16/2022,  8:31 AM

## 2022-07-16 NOTE — Patient Instructions (Addendum)
We have sent the following medications to your pharmacy for you to pick up at your convenience: Carafate 1 gm at bedtime   Omeprazole 20 mg  Decrease Omeprazole to 20 mg every AM  _______________________________________________________  If your blood pressure at your visit was 140/90 or greater, please contact your primary care physician to follow up on this.  _______________________________________________________  If you are age 68 or older, your body mass index should be between 23-30. Your Body mass index is 20.52 kg/m. If this is out of the aforementioned range listed, please consider follow up with your Primary Care Provider.  If you are age 47 or younger, your body mass index should be between 19-25. Your Body mass index is 20.52 kg/m. If this is out of the aformentioned range listed, please consider follow up with your Primary Care Provider.   ________________________________________________________  The  GI providers would like to encourage you to use [facility] to communicate with providers for non-urgent requests or questions.  Due to long hold times on the telephone, sending your provider a message by [facility] may be a faster and more efficient way to get a response.  Please allow 48 business hours for a response.  Please remember that this is for non-urgent requests.  _______________________________________________________   I appreciate the  opportunity to care for you  Thank You   Midge Minium

## 2022-07-20 NOTE — Progress Notes (Incomplete)
Assessment and Plan   Primary GI: Previously Donna Danas, MD  Brief Narrative:  67 y.o. yo female whose past medical history includes but is not necessarily limited to chronic abdominal pain, colon polyps, appendectomy and colon resection in 2015  Donna Hawkins was last seen here by Dr. Orvan Falconer on 3//4/23 for evaluation of acute on chronic mid abdominal pain. Please refer to that note for details. Labs were unremarkable. CT AP w contrast was unrevealing. She subsequently underwent an EGD 05/25/22 which was unremarkable. Unlike what was described at her last visit, the mid upper abdominal pain seems to get better with food. We had stopped Omeprazole and started famotidine which didn't help so she resumed Omeprazole  but is concerned about taking the it due to history of osteoporosisShe has noticed that the  pain is most bothersome at night so she has been taking Carafate at bedtime.  which helps. .  -Carafate at bedtime since that is when pain is most bothersome.    History of Present Illness   Chief complaint:  following up after EGD  Donna Hawkins has been followed by Dr. Orvan Falconer, she was seen on 04/30/2022 for evaluation of acute on chronic abdominal pain, please refer to that note for details.  In summary she was changed from PPI to famotidine.  Labs were obtained and unremarkable including a CMP and lipase.  CT scan of the abdomen and pelvis was done and unrevealing .  EGD was done and also unrevealing  Donna Hawkins is here for follow up after EGD. Famoitdine didn't help epigastric pain. The pain bothers her mainly at night. Pain maes it difficult to falll asleep and has awoken her at night. She has had this pain for years but it got worse in January. abdominal pain so she went back on Omeprazole works better but she is concerned about taking with osteoporosis. She sometimes takes Pepto at night if needed and that is helpful.    He weigh is stable .    Procedure risk assessment:  No history of CHF.  No  supplemental 02 use at home.  Not a known difficult airway Anticoagulant:      Previous GI Endoscopies / Labs / Imaging    05/14/22 CT AP with contrast No acute abnormalities  Most recent endoscopic evaluation 05/25/22 EGD  - LA Grade A reflux esophagitis with no bleeding. Biopsied. - Normal stomach. Biopsied. - Normal examined duodenum. Biopsied.  Diagnosis 1. Surgical [P], duodenal biopsies - BENIGN SMALL BOWEL MUCOSA WITH NO SIGNIFICANT PATHOLOGIC CHANGES 2. Surgical [P], fundus, gastric antrum, and gastric body - GASTRIC ANTRAL AND OXYNTIC MUCOSA WITH FEATURES OF MILD REACTIVE GASTROPATHY - NEGATIVE FOR H. PYLORI ON H&E STAIN - NEGATIVE FOR INTESTINAL METAPLASIA OR MALIGNANCY 3. Surgical [P], distal esophagus - BENIGN SQUAMOUS AND GASTRIC CARDIAC MUCOSA WITH NO SPECIFIC PATHOLOGIC CHANGES - NEGATIVE FOR INCREASED INTRAEPITHELIAL EOSINOPHILS - NEGATIVE FOR INTESTINAL METAPLASIA, DYSPLASIA OR MALIGNANCY 4. Surgical [P], mid esophagus and proximal esophagus - SQUAMOUS MUCOSA WITH NO SPECIFIC PATHOLOGIC CHANGES - NEGATIVE FOR INCREASED INTRAEPITHELIAL EOSINOPHILS - NEGATIVE FOR DYSPLASIA OR MALIGNANCY       Latest Ref Rng & Units 04/30/2022    9:45 AM 03/10/2022    8:23 AM 12/13/2019    9:59 AM  Hepatic Function  Total Protein 6.0 - 8.3 g/dL 6.9  7.4  7.3   Albumin 3.5 - 5.2 g/dL 4.2  4.7    AST 0 - 37 U/L 18  18  26    ALT 0 - 35  U/L 18  17  29    Alk Phosphatase 39 - 117 U/L 92  91    Total Bilirubin 0.2 - 1.2 mg/dL 1.0  0.6  0.7        Latest Ref Rng & Units 03/10/2022    8:23 AM 12/13/2019    9:59 AM 12/09/2018    2:10 PM  CBC  WBC 4.0 - 10.5 K/uL 7.2  7.9  8.6   Hemoglobin 12.0 - 15.0 g/dL 96.0  45.4  09.8   Hematocrit 36.0 - 46.0 % 39.2  37.8  39.5   Platelets 150.0 - 400.0 K/uL 409.0  397  383.0         Past Medical History:  Diagnosis Date  . Anxiety   . Appendicitis 09/25/2013  . Basal cell carcinoma   . Blood in stool   . Chicken pox   .  Dysphagia   . GERD (gastroesophageal reflux disease)   . High blood pressure   . HLD (hyperlipidemia)   . Mitral valve prolapse   . Osteoporosis     Past Surgical History:  Procedure Laterality Date  . APPENDECTOMY  2015   Open appendectomy with right colectomy  . BREAST BIOPSY Right 2001   benign  . COLON SURGERY  2015   right hemicolectomy done at time of appendectomy  . LAPAROSCOPIC APPENDECTOMY N/A 09/25/2013   Procedure:  LAPAROSCOPIC APPENDECTOMY CONVERTED TO LAPAROSCOPIC ASSISTED RIGHT COLECTOMY;  Surgeon: Atilano Ina, MD;  Location: [facility];  Service: General;  Laterality: N/A;  . TONSILLECTOMY      Current Medications, Allergies, Family History and Social History were reviewed in [employer] electronic medical record.     Current Outpatient Medications  Medication Sig Dispense Refill  . ALPRAZolam (XANAX) 0.25 MG tablet Take 1 tablet (0.25 mg total) by mouth 2 (two) times daily as needed for anxiety or sleep. 60 tablet 2  . ascorbic acid (VITAMIN C) 500 MG tablet Take 500 mg by mouth daily.    Marland Kitchen bismuth subsalicylate (PEPTO BISMOL) 262 MG/15ML suspension Take 30 mLs by mouth at bedtime.    Marland Kitchen CALCIUM-VITAMIN D PO Take 1 capsule by mouth 3 (three) times daily.    . Cholecalciferol (VITAMIN D3 PO) Take 2,000 Units by mouth daily.    . famotidine (PEPCID) 20 MG tablet Take 1 tablet (20 mg total) by mouth 2 (two) times daily. 90 tablet 3  . losartan (COZAAR) 50 MG tablet TAKE 1 TABLET BY MOUTH EVERY DAY 90 tablet 1  . Magnesium 300 MG CAPS Take 1 capsule by mouth daily.    . Menaquinone-7 (VITAMIN K2 PO) Take by mouth daily.    . Omega-3 Fatty Acids (FISH OIL PO) Take 1 capsule by mouth daily.    Marland Kitchen omeprazole (PRILOSEC) 20 MG capsule Take 25 mg by mouth daily. Take as needed. (Patient not taking: Reported on 05/25/2022)    . OVER THE COUNTER MEDICATION Vitamin code Raw calcium    . OVER THE COUNTER MEDICATION My Kind Organic plant calcium     No current  facility-administered medications for this visit.    Review of Systems: No chest pain. No shortness of breath. No urinary complaints.    Physical Exam  Wt Readings from Last 3 Encounters:  05/25/22 135 lb (61.2 kg)  04/30/22 135 lb 2 oz (61.3 kg)  03/10/22 133 lb (60.3 kg)    There were no vitals taken for this visit. Constitutional:  Pleasant, generally well appearing ***female in no acute  distress. Psychiatric: Normal mood and affect. Behavior is normal. EENT: Pupils normal.  Conjunctivae are normal. No scleral icterus. Neck supple.  Cardiovascular: Normal rate, regular rhythm.  Pulmonary/chest: Effort normal and breath sounds normal. No wheezing, rales or rhonchi. Abdominal: Soft, nondistended, nontender. Bowel sounds active throughout. There are no masses palpable. No hepatomegaly. Neurological: Alert and oriented to person place and time.  Extremities: *** edema Skin: Skin is warm and dry. No rashes noted.  Willette Cluster, NP  07/16/2022, 8:31 AM  Cc:  Philip Aspen, [place]*

## 2022-07-21 ENCOUNTER — Encounter: Payer: Self-pay | Admitting: Nurse Practitioner

## 2022-07-21 NOTE — Progress Notes (Signed)
____________________________________________________________  Attending physician addendum:  Thank you for sending this case to me. I have reviewed the entire note and agree with the plan with the following additions  Dr. Blair Dolphin note indicates that this patient's acute on chronic and also somewhat difficult character than previous burning abdominal discomfort.  Not clear if it is nonulcer dyspepsia or possible biliary colic.  No gallstones on most recent CT abdomen/pelvis, though ultrasound is more sensitive for noncalcified gallstones. She has not had prior cholecystectomy based on chart review, and no gallbladder ultrasound in this EMR.  Unless you are aware of any gallbladder imaging in recent months, I recommend she have a right upper quadrant ultrasound to evaluate for possible gallstones.  Amada Jupiter, MD  ____________________________________________________________

## 2022-08-07 ENCOUNTER — Emergency Department (HOSPITAL_COMMUNITY)
Admission: EM | Admit: 2022-08-07 | Discharge: 2022-08-07 | Disposition: A | Discharge status: Home / Self Care | Payer: Medicare PPO | Attending: Emergency Medicine | Admitting: Emergency Medicine

## 2022-08-07 ENCOUNTER — Emergency Department (HOSPITAL_COMMUNITY): Payer: Medicare PPO

## 2022-08-07 ENCOUNTER — Encounter (HOSPITAL_COMMUNITY): Payer: Self-pay

## 2022-08-07 ENCOUNTER — Other Ambulatory Visit: Payer: Self-pay

## 2022-08-07 DIAGNOSIS — I10 Essential (primary) hypertension: Secondary | ICD-10-CM | POA: Diagnosis not present

## 2022-08-07 DIAGNOSIS — Z79899 Other long term (current) drug therapy: Secondary | ICD-10-CM | POA: Diagnosis not present

## 2022-08-07 DIAGNOSIS — R519 Headache, unspecified: Secondary | ICD-10-CM

## 2022-08-07 DIAGNOSIS — R0602 Shortness of breath: Secondary | ICD-10-CM | POA: Diagnosis not present

## 2022-08-07 DIAGNOSIS — R0789 Other chest pain: Secondary | ICD-10-CM | POA: Diagnosis not present

## 2022-08-07 LAB — TROPONIN I (HIGH SENSITIVITY)
Troponin I (High Sensitivity): 29 ng/L — ABNORMAL HIGH (ref <18)
Troponin I (High Sensitivity): 29 ng/L — ABNORMAL HIGH (ref <18)

## 2022-08-07 LAB — SEDIMENTATION RATE: Sed Rate: 5 mm/hr (ref 0–22)

## 2022-08-07 LAB — BASIC METABOLIC PANEL
Anion gap: 12 (ref 5–15)
BUN: 9 mg/dL (ref 8–23)
CO2: 20 mmol/L — ABNORMAL LOW (ref 22–32)
Calcium: 9.8 mg/dL (ref 8.9–10.3)
Chloride: 101 mmol/L (ref 98–111)
Creatinine, Ser: 0.8 mg/dL (ref 0.44–1.00)
GFR, Estimated: >60 mL/min (ref >60)
Glucose, Bld: 116 mg/dL — ABNORMAL HIGH (ref 70–99)
Potassium: 3.7 mmol/L (ref 3.5–5.1)
Sodium: 133 mmol/L — ABNORMAL LOW (ref 135–145)

## 2022-08-07 LAB — CBC
HCT: 38.9 % (ref 36.0–46.0)
Hemoglobin: 13.2 g/dL (ref 12.0–15.0)
MCH: 31.7 pg (ref 26.0–34.0)
MCHC: 33.9 g/dL (ref 30.0–36.0)
MCV: 93.5 fL (ref 80.0–100.0)
Platelets: 392 10*3/uL (ref 150–400)
RBC: 4.16 MIL/uL (ref 3.87–5.11)
RDW: 12.6 % (ref 11.5–15.5)
WBC: 9.6 10*3/uL (ref 4.0–10.5)
nRBC: 0 % (ref 0.0–0.2)

## 2022-08-07 MED ORDER — KETOROLAC TROMETHAMINE 30 MG/ML IJ SOLN
15.0000 mg | Freq: Once | INTRAMUSCULAR | Status: AC
  Administered 2022-08-07: 15 mg via INTRAVENOUS
  Filled 2022-08-07: qty 1

## 2022-08-07 MED ORDER — METOCLOPRAMIDE HCL 5 MG/ML IJ SOLN
10.0000 mg | Freq: Once | INTRAMUSCULAR | Status: AC
  Administered 2022-08-07: 10 mg via INTRAVENOUS
  Filled 2022-08-07: qty 2

## 2022-08-07 MED ORDER — SODIUM CHLORIDE 0.9 % IV BOLUS
500.0000 mL | Freq: Once | INTRAVENOUS | Status: AC
  Administered 2022-08-07: 500 mL via INTRAVENOUS

## 2022-08-07 NOTE — Discharge Instructions (Signed)
You were seen in the emergency department for headache and elevated blood pressure.  You had lab work EKG chest x-ray and a CAT scan of your head that did not show a significant cause of your symptoms.  Your blood pressure and symptoms improved with some medication.  Will be important for you to follow-up with your primary care doctor, continue regular medications and stay well-hydrated.  Return if any worsening or concerning symptoms.

## 2022-08-07 NOTE — ED Triage Notes (Addendum)
Pt came in via POV d/t HA the past week & her PCP recommending she come in d/t its continuity & worsening today. Pain is felt on the Rt side of her head & rates it 3/10. Pt also reports taking 1 tab of her 25mg  Losartan-Potassium & when her BP was 149/87 she decided to take half of another tablet at 1300 & her BP was then 173/92. A/Ox4. Reports feeling chest palpitations as well

## 2022-08-07 NOTE — ED Provider Notes (Signed)
[place] EMERGENCY DEPARTMENT AT [facility] Provider Note   CSN: [id number] Arrival date & time: 08/07/22  1654     History {Add pertinent medical, surgical, social history, OB history to HPI:1} Chief Complaint  Patient presents with   Hypertension   Headache   Palpitations    Donna Hawkins is a 67 y.o. female.  She is here with a complaint of a right temporal headache that is been going on about a week.  She said it started gradually and has never been severe but just continues to be there.  Today it was on both sides of her head.  May be a little nausea.  Has been trying ibuprofen and aspirin without improvement.  Checked her blood pressure and found it to be high.  Took an extra dose of her blood pressure medicine and called her PCP who recommended she come here for evaluation.  She is also noticed maybe a little bit of chest pressure but she thinks it is more anxiety.  She has some chronic intermittent numbness in her left arm and both her feet has not changed in the setting of a headache.  No trauma no fever no URI symptoms no visual symptoms  The history is provided by the patient and the spouse.  Headache Pain location:  R temporal Quality:  Dull Severity currently:  3/10 Severity at highest:  3/10 Onset quality:  Gradual Duration:  1 week Timing:  Constant Progression:  Unchanged Chronicity:  New Similar to prior headaches: no   Relieved by:  Nothing Worsened by:  Nothing Ineffective treatments:  NSAIDs Associated symptoms: dizziness and nausea   Associated symptoms: no abdominal pain, no blurred vision, no cough, no eye pain, no facial pain, no fever, no neck pain, no neck stiffness, no photophobia, no syncope and no visual change        Home Medications Prior to Admission medications   Medication Sig Start Date End Date Taking? Authorizing Provider  ALPRAZolam (XANAX) 0.25 MG tablet Take 1 tablet (0.25 mg total) by mouth 2 (two) times daily as  needed for anxiety or sleep. 03/10/22   Philip Aspen, Limmie Patricia, MD  ascorbic acid (VITAMIN C) 500 MG tablet Take 500 mg by mouth daily.    [provider]  bismuth subsalicylate (PEPTO BISMOL) 262 MG/15ML suspension Take 30 mLs by mouth at bedtime.    [provider]  CALCIUM-VITAMIN D PO Take 1 capsule by mouth 3 (three) times daily.    [provider]  Cholecalciferol (VITAMIN D3 PO) Take 2,000 Units by mouth daily.    [provider]  losartan (COZAAR) 50 MG tablet TAKE 1 TABLET BY MOUTH EVERY DAY 06/29/22   Philip Aspen, Limmie Patricia, MD  Magnesium 300 MG CAPS Take 1 capsule by mouth daily.    [provider]  Menaquinone-7 (VITAMIN K2 PO) Take by mouth daily.    [provider]  Omega-3 Fatty Acids (FISH OIL PO) Take 1 capsule by mouth daily.    [provider]  omeprazole (PRILOSEC) 20 MG capsule Take 1 capsule (20 mg total) by mouth daily. 07/16/22   Meredith Pel, NP  OVER THE COUNTER MEDICATION Vitamin code Raw calcium    [provider]  OVER THE COUNTER MEDICATION My Kind Organic plant calcium    [provider]  sucralfate (CARAFATE) 1 g tablet Take 1 tablet (1 g total) by mouth at bedtime. 07/16/22   Meredith Pel, NP  Allergies    Patient has no known allergies.    Review of Systems   Review of Systems  Constitutional:  Negative for fever.  Eyes:  Negative for blurred vision, photophobia and pain.  Respiratory:  Positive for chest tightness. Negative for cough.   Cardiovascular:  Positive for palpitations. Negative for chest pain and syncope.  Gastrointestinal:  Positive for nausea. Negative for abdominal pain.  Musculoskeletal:  Negative for neck pain and neck stiffness.  Neurological:  Positive for dizziness and headaches.    Physical Exam Updated Vital Signs BP (!) 181/92 (BP Location: Left Arm)   Pulse 99   Temp 98.3 F (36.8 C) (Oral)   Resp 18   SpO2 99%  Physical  Exam Vitals and nursing note reviewed.  Constitutional:      General: She is not in acute distress.    Appearance: She is well-developed.  HENT:     Head: Normocephalic and atraumatic.     Comments: No temporal tenderness or redness Eyes:     Extraocular Movements: Extraocular movements intact.     Conjunctiva/sclera: Conjunctivae normal.     Pupils: Pupils are equal, round, and reactive to light.  Cardiovascular:     Rate and Rhythm: Normal rate and regular rhythm.     Heart sounds: No murmur heard. Pulmonary:     Effort: Pulmonary effort is normal. No respiratory distress.     Breath sounds: Normal breath sounds.  Abdominal:     Palpations: Abdomen is soft.     Tenderness: There is no abdominal tenderness.  Musculoskeletal:        General: No swelling.     Cervical back: Neck supple.  Skin:    General: Skin is warm and dry.     Capillary Refill: Capillary refill takes less than 2 seconds.  Neurological:     Mental Status: She is alert and oriented to person, place, and time.     GCS: GCS eye subscore is 4. GCS verbal subscore is 5. GCS motor subscore is 6.     Cranial Nerves: No cranial nerve deficit or dysarthria.     Sensory: No sensory deficit.     Motor: No weakness.     ED Results / Procedures / Treatments   Labs (all labs ordered are listed, but only abnormal results are displayed) Labs Reviewed  BASIC METABOLIC PANEL  CBC  SEDIMENTATION RATE  TROPONIN I (HIGH SENSITIVITY)    EKG EKG Interpretation  Date/Time:  Friday August 07 2022 16:57:53 EDT Ventricular Rate:  88 PR Interval:  170 QRS Duration: 92 QT Interval:  358 QTC Calculation: 433 R Axis:   79 Text Interpretation: Normal sinus rhythm Normal ECG When compared with ECG of 25-Sep-2013 15:56, rate is slower Confirmed by Meridee Score [phone]) on 08/07/2022 5:44:57 PM  Radiology No results found.  Procedures Procedures  {Document cardiac monitor, telemetry assessment procedure when  appropriate:1}  Medications Ordered in ED Medications  sodium chloride 0.9 % bolus 500 mL (has no administration in time range)  ketorolac (TORADOL) 30 MG/ML injection 15 mg (has no administration in time range)  metoCLOPramide (REGLAN) injection 10 mg (has no administration in time range)    ED Course/ Medical Decision Making/ A&P   {   Click here for ABCD2, HEART and other calculatorsREFRESH Note before signing :1}                          Medical Decision Making Amount and/or  Complexity of Data Reviewed Labs: ordered. Radiology: ordered.  Risk Prescription drug management.   This patient complains of ***; this involves an extensive number of treatment Options and is a complaint that carries with it a high risk of complications and morbidity. The differential includes ***  I ordered, reviewed and interpreted labs, which included *** I ordered medication *** and reviewed PMP when indicated. I ordered imaging studies which included *** and I independently    visualized and interpreted imaging which showed *** Additional history obtained from *** Previous records obtained and reviewed *** I consulted *** and discussed lab and imaging findings and discussed disposition.  Cardiac monitoring reviewed, *** Social determinants considered, *** Critical Interventions: ***  After the interventions stated above, I reevaluated the patient and found *** Admission and further testing considered, ***   {Document critical care time when appropriate:1} {Document review of labs and clinical decision tools ie heart score, Chads2Vasc2 etc:1}  {Document your independent review of radiology images, and any outside records:1} {Document your discussion with family members, caretakers, and with consultants:1} {Document social determinants of health affecting pt's care:1} {Document your decision making why or why not admission, treatments were needed:1} Final Clinical Impression(s) / ED  Diagnoses Final diagnoses:  None    Rx / DC Orders ED Discharge Orders     None

## 2022-08-10 ENCOUNTER — Ambulatory Visit: Payer: Medicare PPO | Admitting: Internal Medicine

## 2022-08-12 ENCOUNTER — Telehealth: Payer: Self-pay

## 2022-08-12 NOTE — Telephone Encounter (Signed)
Transition Care Management Unsuccessful Follow-up Telephone Call  Date of discharge and from where:  [place]  Attempts:  1st Attempt  Reason for unsuccessful TCM follow-up call:  Unable to leave message   Christabelle Hanzlik [facility],  [phone] [address], [place], [place] [address] Phone: [phone] Email: [email or web address]       

## 2022-08-12 NOTE — Telephone Encounter (Signed)
-----   Message from Meredith Pel, NP sent at 08/11/2022  9:31 PM EDT ----- Waynetta Sandy,  Please let her know I spoke with Dr Myrtie Neither who is taking over for Dr. Orvan Falconer. I know she was feeling better when I saw her in clinic but he suggested a RUQ to evaluate gallbladder if she has ongoing / recurrent upper abdominal pain. Thanks

## 2022-08-12 NOTE — Telephone Encounter (Signed)
No answer. Unable to leave message. Mailbox was full and cannot accept any messages.

## 2022-08-12 NOTE — Telephone Encounter (Signed)
Transition Care Management Unsuccessful Follow-up Telephone Call  Date of discharge and from where:  [place] 6/14  Attempts:  2nd Attempt  Reason for unsuccessful TCM follow-up call:  Left voice message   Donna Hawkins [facility], [place] [phone] [address], [place], [place] [address] Phone: [phone] Email: [email or web address]       

## 2022-08-14 ENCOUNTER — Encounter: Payer: Self-pay | Admitting: Family Medicine

## 2022-08-14 ENCOUNTER — Ambulatory Visit: Payer: Medicare PPO | Admitting: Family Medicine

## 2022-08-14 VITALS — BP 124/92 | HR 63 | Temp 97.6°F | Wt 129.6 lb

## 2022-08-14 DIAGNOSIS — F419 Anxiety disorder, unspecified: Secondary | ICD-10-CM

## 2022-08-14 DIAGNOSIS — I10 Essential (primary) hypertension: Secondary | ICD-10-CM | POA: Diagnosis not present

## 2022-08-14 DIAGNOSIS — G44209 Tension-type headache, unspecified, not intractable: Secondary | ICD-10-CM

## 2022-08-14 MED ORDER — LOSARTAN POTASSIUM-HCTZ 100-25 MG PO TABS
1.0000 | ORAL_TABLET | Freq: Every day | ORAL | 2 refills | Status: DC
Start: 2022-08-14 — End: 2022-08-20

## 2022-08-14 MED ORDER — FLUOXETINE HCL 20 MG PO CAPS: 20.0000 mg | ORAL_CAPSULE | Freq: Every day | ORAL | 2 refills | Status: DC

## 2022-08-14 NOTE — Progress Notes (Signed)
   Subjective:    Patient ID: Donna Hawkins, female    DOB: February 01, 1956, 67 y.o.   MRN: [id number]  HPI Here to follow up on an ED visit on 08-07-22 for bitemporal headaches. These started on the right side and then spread to the left as well. The headaches were never severe, but were constant. On arrival to the ED her BP was 181/92. Her exam was normal (including no temporal tenderness). Her EKG was normal. Her labs were normal (including an ESR of 5). Her head CT was normal. It was felt that stress was a factor. Her medications were kept the same, and she was told to follow up here. Since that day she has continued to feel anxious, her BP has been elevated, and she has a dull temporal headache. I asked if she clenched her teeth, and she admitted that she has done this for years. Her dentist made her a mouthguard to wear while sleeping, but she does not use it.    Review of Systems  Constitutional: Negative.   HENT: Negative.    Eyes: Negative.   Respiratory: Negative.    Cardiovascular: Negative.   Gastrointestinal: Negative.   Genitourinary: Negative.   Neurological:  Positive for headaches. Negative for dizziness, tremors, seizures, syncope, facial asymmetry, speech difficulty, weakness, light-headedness and numbness.  Psychiatric/Behavioral:  Positive for sleep disturbance. Negative for agitation, behavioral problems, confusion, decreased concentration, dysphoric mood and hallucinations. The patient is nervous/anxious.        Objective:   Physical Exam Constitutional:      Appearance: Normal appearance. She is not ill-appearing.  HENT:     Head: Normocephalic and atraumatic.     Comments: She is mildly tender over the right temple  Cardiovascular:     Rate and Rhythm: Normal rate and regular rhythm.     Pulses: Normal pulses.     Heart sounds: Normal heart sounds.  Pulmonary:     Effort: Pulmonary effort is normal.     Breath sounds: Normal breath sounds.  Neurological:      General: No focal deficit present.     Mental Status: She is alert and oriented to person, place, and time.  Psychiatric:        Behavior: Behavior normal.        Thought Content: Thought content normal.     Comments: She appears to be anxious            Assessment & Plan:  She has HTN, and her anxiety has caused her BP to be higher than usual. She has had anxiety for years, but this has never really been treated. She takes walks with her dog and gets an occasional massage. She takes Xanax rarely. For the HTN, we will change from Losartan to Losartan HCT 100-25 daily. For the anxiety, she will start on Prozac 20 mg daily. Her headache is a tension headache, so I advised her to start using the night time mouthguard again, at least for the next few weeks. She will follow up with Dr, Ardyth Harps on 09-08-22. We spent a total of ( 35  ) minutes reviewing records and discussing these issues.  Gershon Crane, MD

## 2022-08-18 NOTE — Telephone Encounter (Signed)
Left message with female for patient to call back. 

## 2022-08-19 ENCOUNTER — Emergency Department (HOSPITAL_COMMUNITY)
Admission: EM | Admit: 2022-08-19 | Discharge: 2022-08-20 | Disposition: A | Discharge status: Home / Self Care | Payer: Medicare PPO | Attending: Emergency Medicine | Admitting: Emergency Medicine

## 2022-08-19 ENCOUNTER — Emergency Department (HOSPITAL_COMMUNITY): Payer: Medicare PPO

## 2022-08-19 ENCOUNTER — Ambulatory Visit: Payer: Medicare PPO | Admitting: Family Medicine

## 2022-08-19 ENCOUNTER — Other Ambulatory Visit: Payer: Self-pay

## 2022-08-19 ENCOUNTER — Encounter: Payer: Self-pay | Admitting: Family Medicine

## 2022-08-19 ENCOUNTER — Encounter (HOSPITAL_COMMUNITY): Payer: Self-pay | Admitting: *Deleted

## 2022-08-19 VITALS — BP 160/100 | HR 94 | Temp 98.4°F | Ht 67.0 in | Wt 131.0 lb

## 2022-08-19 DIAGNOSIS — I160 Hypertensive urgency: Secondary | ICD-10-CM | POA: Diagnosis not present

## 2022-08-19 DIAGNOSIS — R519 Headache, unspecified: Secondary | ICD-10-CM | POA: Diagnosis not present

## 2022-08-19 DIAGNOSIS — I10 Essential (primary) hypertension: Secondary | ICD-10-CM | POA: Insufficient documentation

## 2022-08-19 DIAGNOSIS — F419 Anxiety disorder, unspecified: Secondary | ICD-10-CM | POA: Diagnosis not present

## 2022-08-19 DIAGNOSIS — R5381 Other malaise: Secondary | ICD-10-CM | POA: Insufficient documentation

## 2022-08-19 DIAGNOSIS — E871 Hypo-osmolality and hyponatremia: Secondary | ICD-10-CM | POA: Diagnosis not present

## 2022-08-19 DIAGNOSIS — E878 Other disorders of electrolyte and fluid balance, not elsewhere classified: Secondary | ICD-10-CM | POA: Diagnosis not present

## 2022-08-19 DIAGNOSIS — R079 Chest pain, unspecified: Secondary | ICD-10-CM | POA: Diagnosis not present

## 2022-08-19 LAB — BASIC METABOLIC PANEL
Anion gap: 15 (ref 5–15)
BUN: 9 mg/dL (ref 8–23)
CO2: 17 mmol/L — ABNORMAL LOW (ref 22–32)
Calcium: 9.7 mg/dL (ref 8.9–10.3)
Chloride: 93 mmol/L — ABNORMAL LOW (ref 98–111)
Creatinine, Ser: 0.74 mg/dL (ref 0.44–1.00)
GFR, Estimated: >60 mL/min (ref >60)
Glucose, Bld: 121 mg/dL — ABNORMAL HIGH (ref 70–99)
Potassium: 3.4 mmol/L — ABNORMAL LOW (ref 3.5–5.1)
Sodium: 125 mmol/L — ABNORMAL LOW (ref 135–145)

## 2022-08-19 LAB — CBC
HCT: 35.4 % — ABNORMAL LOW (ref 36.0–46.0)
Hemoglobin: 12.4 g/dL (ref 12.0–15.0)
MCH: 30.7 pg (ref 26.0–34.0)
MCHC: 35 g/dL (ref 30.0–36.0)
MCV: 87.6 fL (ref 80.0–100.0)
Platelets: 386 10*3/uL (ref 150–400)
RBC: 4.04 MIL/uL (ref 3.87–5.11)
RDW: 12.3 % (ref 11.5–15.5)
WBC: 8.6 10*3/uL (ref 4.0–10.5)
nRBC: 0 % (ref 0.0–0.2)

## 2022-08-19 LAB — TROPONIN I (HIGH SENSITIVITY): Troponin I (High Sensitivity): 5 ng/L (ref <18)

## 2022-08-19 NOTE — Progress Notes (Signed)
Established Patient Office Visit   Subjective  Patient ID: Donna Hawkins, female    DOB: September 28, 1955  Age: 67 y.o. MRN: [id number]  Chief Complaint  Patient presents with   Hypertension  Patient accompanied by her husband.  Patient is a 67 year old female followed by Dr. Ardyth Harps and seen for ongoing concern.  Patient seen in ED 3 weeks ago for headache.  At the time BP elevated.  Labs with mildly decreased sodium, slight elevation in troponin.  CT head negative.  Headache improved after migraine cocktail and BP improved after fluids.  Patient advised to continue losartan.  Patient seen in office on 6/21 for continued headache and BP elevation.  Losartan changed to losartan-HCTZ 100-25 mg daily.  Given Rx for Prozac 20 mg daily as anxiety thought to also be contributing to symptoms.  Patient had previous prescription for Xanax which she uses sparingly.  Patient presents to clinic today for continued BP elevation.  BP on Sunday 117/73.  Noticed on Tuesday and continued to creep up.  BP 197/110 on 6/25.  Patient denies any changes in medications, foods, increased stress, increased anxiety.  [place]  Of note patient did start Prozac a day prior to BP becoming elevated.  Continued headache with a mild shooting pain in right temple area.  Past Medical History:  Diagnosis Date   Anxiety    Appendicitis 09/25/2013   Basal cell carcinoma    Blood in stool    Chicken pox    Dysphagia    GERD (gastroesophageal reflux disease)    High blood pressure    HLD (hyperlipidemia)    Mitral valve prolapse    Osteoporosis    Past Surgical History:  Procedure Laterality Date   APPENDECTOMY  2015   Open appendectomy with right colectomy   BREAST BIOPSY Right 2001   benign   COLON SURGERY  2015   right hemicolectomy done at time of appendectomy   LAPAROSCOPIC APPENDECTOMY N/A 09/25/2013   Procedure:  LAPAROSCOPIC APPENDECTOMY CONVERTED TO LAPAROSCOPIC  ASSISTED RIGHT COLECTOMY;  Surgeon: Atilano Ina, MD;  Location: [facility] OR;  Service: General;  Laterality: N/A;   TONSILLECTOMY     Social History   Tobacco Use   Smoking status: Never   Smokeless tobacco: Never  Vaping Use   Vaping Use: Never used  Substance Use Topics   Alcohol use: Yes    Comment: 1 glass of wine once a week   Drug use: No   Family History  Problem Relation Age of Onset   Stroke Mother 80       brain hemorrhage   Irritable bowel syndrome Mother    Hyperlipidemia Mother    COPD Father    Ovarian cancer Maternal Grandmother    Diabetes Paternal Grandmother    Heart disease Paternal Grandfather 57       MI   Hypertension Daughter    Hypertension Son    Autoimmune disease Neg Hx    Colon cancer Neg Hx    Rectal cancer Neg Hx    Stomach cancer Neg Hx    Esophageal cancer Neg Hx    No Known Allergies    ROS Negative unless stated above    Objective:     BP (!) 160/100 (BP Location: Left Arm, Patient Position: Sitting, Cuff Size: Normal)   Pulse 94   Temp 98.4 F (36.9 C) (Oral)   Ht 5\' 7"  (1.702 m)   Wt 131 lb (  59.4 kg)   SpO2 97%   BMI 20.52 kg/m    Physical Exam Constitutional:      General: She is not in acute distress.    Appearance: Normal appearance.  HENT:     Head: Normocephalic and atraumatic.     Nose: Nose normal.     Mouth/Throat:     Mouth: Mucous membranes are moist.  Cardiovascular:     Rate and Rhythm: Normal rate and regular rhythm.     Heart sounds: Normal heart sounds. No murmur heard.    No gallop.  Pulmonary:     Effort: Pulmonary effort is normal. No respiratory distress.     Breath sounds: Normal breath sounds. No wheezing, rhonchi or rales.  Skin:    General: Skin is warm and dry.  Neurological:     Mental Status: She is alert and oriented to person, place, and time.      No results found for any visits on 08/19/22.    Assessment & Plan:  Hypertensive urgency  Anxiety  Acute nonintractable  headache, unspecified headache type   Recent elevation in BP over the last month.  Controlled up until this week.  Concern for starting Prozac may have contributed to symptoms.  Will have patient discontinue Prozac and monitor BP.  Advised on likely half-life of medication.  Discussed possible causes of headache including dehydration, electrolyte abnormality, increased anxiety/stress, elevated BP. Patient has, and follow-up with PCP scheduled. For continued or worsening symptoms advised to proceed to nearest ED for further evaluation.  Return in about 2 weeks (around 09/02/2022).   Deeann Saint, MD

## 2022-08-19 NOTE — ED Triage Notes (Signed)
Headache since June 14th elevated bp also with a headache also  her doctor gave her meds  bp has been high since then

## 2022-08-20 LAB — URINALYSIS, ROUTINE W REFLEX MICROSCOPIC
Bacteria, UA: NONE SEEN
Bilirubin Urine: NEGATIVE
Glucose, UA: NEGATIVE mg/dL
Ketones, ur: NEGATIVE mg/dL
Leukocytes,Ua: NEGATIVE
Nitrite: NEGATIVE
Protein, ur: NEGATIVE mg/dL
Specific Gravity, Urine: 1.001 — ABNORMAL LOW (ref 1.005–1.030)
pH: 8 (ref 5.0–8.0)

## 2022-08-20 LAB — TROPONIN I (HIGH SENSITIVITY): Troponin I (High Sensitivity): 5 ng/L (ref <18)

## 2022-08-20 MED ORDER — LOSARTAN POTASSIUM 100 MG PO TABS: 100.0000 mg | ORAL_TABLET | Freq: Every day | ORAL | 0 refills | Status: DC

## 2022-08-20 MED ORDER — LACTATED RINGERS IV BOLUS
1000.0000 mL | Freq: Once | INTRAVENOUS | Status: AC
  Administered 2022-08-20: 1000 mL via INTRAVENOUS

## 2022-08-20 NOTE — ED Provider Notes (Signed)
[facility]PT [facility] Emergency Department Provider Note MRN:  [id number]  Arrival date & time: 08/20/22     Chief Complaint   Headache   History of Present Illness   Donna Hawkins is a 67 y.o. year-old female with a history of hypertension presenting to the ED with chief complaint of headache.  Intermittent headache, lightheadedness, malaise, fatigue for the past 2 or 3 weeks.  Wondering if it is related to her high blood pressure.  Has been running a bit high recently.  Denies numbness or weakness to the arms or legs, no chest pain or shortness of breath, no fever, no cough, no burning with urination.  Review of Systems  A thorough review of systems was obtained and all systems are negative except as noted in the HPI and PMH.   Patient's Health History    Past Medical History:  Diagnosis Date   Anxiety    Appendicitis 09/25/2013   Basal cell carcinoma    Blood in stool    Chicken pox    Dysphagia    GERD (gastroesophageal reflux disease)    High blood pressure    HLD (hyperlipidemia)    Mitral valve prolapse    Osteoporosis     Past Surgical History:  Procedure Laterality Date   APPENDECTOMY  2015   Open appendectomy with right colectomy   BREAST BIOPSY Right 2001   benign   COLON SURGERY  2015   right hemicolectomy done at time of appendectomy   LAPAROSCOPIC APPENDECTOMY N/A 09/25/2013   Procedure:  LAPAROSCOPIC APPENDECTOMY CONVERTED TO LAPAROSCOPIC ASSISTED RIGHT COLECTOMY;  Surgeon: Atilano Ina, MD;  Location: [facility] OR;  Service: General;  Laterality: N/A;   TONSILLECTOMY      Family History  Problem Relation Age of Onset   Stroke Mother 41       brain hemorrhage   Irritable bowel syndrome Mother    Hyperlipidemia Mother    COPD Father    Ovarian cancer Maternal Grandmother    Diabetes Paternal Grandmother    Heart disease Paternal Grandfather 37       MI   Hypertension Daughter    Hypertension Son    Autoimmune disease Neg Hx     Colon cancer Neg Hx    Rectal cancer Neg Hx    Stomach cancer Neg Hx    Esophageal cancer Neg Hx     Social History   Socioeconomic History   Marital status: Married    Spouse name: Not on file   Number of children: 2   Years of education: Not on file   Highest education level: 12th grade  Occupational History   Occupation: retired  Tobacco Use   Smoking status: Never   Smokeless tobacco: Never  Vaping Use   Vaping Use: Never used  Substance and Sexual Activity   Alcohol use: Yes    Comment: 1 glass of wine once a week   Drug use: No   Sexual activity: Not on file  Other Topics Concern   Not on file  Social History Narrative   Not on file   Social Determinants of Health   Financial Resource Strain: Low Risk  (08/13/2022)   Overall Financial Resource Strain (CARDIA)    Difficulty of Paying Living Expenses: Not hard at all  Food Insecurity: No Food Insecurity (08/13/2022)   Hunger Vital Sign    Worried About Running Out of Food in the Last Year: Never true    [employer] of Food  in the Last Year: Never true  Transportation Needs: No Transportation Needs (08/13/2022)   PRAPARE - Administrator, Civil Service (Medical): No    Lack of Transportation (Non-Medical): No  Physical Activity: Insufficiently Active (08/13/2022)   Exercise Vital Sign    Days of Exercise per Week: 5 days    Minutes of Exercise per Session: 20 min  Stress: Stress Concern Present (08/13/2022)   [employer] of Occupational Health - Occupational Stress Questionnaire    Feeling of Stress : To some extent  Social Connections: Moderately Isolated (08/13/2022)   Social Connection and Isolation Panel [NHANES]    Frequency of Communication with Friends and Family: Once a week    Frequency of Social Gatherings with Friends and Family: More than three times a week    Attends Religious Services: Never    Database administrator or Organizations: No    Attends Engineer, structural: Not on  file    Marital Status: Married  Catering manager Violence: Not At Risk (08/12/2021)   Humiliation, Afraid, Rape, and Kick questionnaire    Fear of Current or Ex-Partner: No    Emotionally Abused: No    Physically Abused: No    Sexually Abused: No     Physical Exam   Vitals:   08/19/22 2159 08/19/22 2339  BP: (!) 168/89 (!) 162/82  Pulse: 90 70  Resp: 16 18  Temp: 98.3 F (36.8 C) 98.4 F (36.9 C)  SpO2: 100% 99%    CONSTITUTIONAL: Well-appearing, NAD NEURO/PSYCH:  Alert and oriented x 3, normal and symmetric strength and sensation, normal coordination, normal speech EYES:  eyes equal and reactive ENT/NECK:  no LAD, no JVD CARDIO: Regular rate, well-perfused, normal S1 and S2 PULM:  CTAB no wheezing or rhonchi GI/GU:  non-distended, non-tender MSK/SPINE:  No gross deformities, no edema SKIN:  no rash, atraumatic   *Additional and/or pertinent findings included in MDM below  Diagnostic and Interventional Summary    EKG Interpretation  Date/Time:  08-19-2022 at 23:50:15 Ventricular Rate:   73 PR Interval:   178 QRS Duration:  98 QT Interval: 424   QTC Calculation:467   R Axis:     Text Interpretation: Sinus rhythm, no concerning ischemic features       Labs Reviewed  BASIC METABOLIC PANEL - Abnormal; Notable for the following components:      Result Value   Sodium 125 (*)    Potassium 3.4 (*)    Chloride 93 (*)    CO2 17 (*)    Glucose, Bld 121 (*)    All other components within normal limits  CBC - Abnormal; Notable for the following components:   HCT 35.4 (*)    All other components within normal limits  URINALYSIS, ROUTINE W REFLEX MICROSCOPIC - Abnormal; Notable for the following components:   Color, Urine COLORLESS (*)    Specific Gravity, Urine 1.001 (*)    Hgb urine dipstick SMALL (*)    All other components within normal limits  TROPONIN I (HIGH SENSITIVITY)  TROPONIN I (HIGH SENSITIVITY)    DG Chest 2 View  Final Result      Medications   lactated ringers bolus 1,000 mL (0 mLs Intravenous Stopped 08/20/22 0150)     Procedures  /  Critical Care Procedures  ED Course and Medical Decision Making  Initial Impression and Ddx Vague symptoms of headache, malaise, fatigue, occasional lightheadedness.  Broad differential including electrolyte disturbance, liver or renal failure, dehydration, underlying  infection.  No cough or shortness of breath, abdomen soft and nontender, reassuring neurological exam.  Had a CT head a few weeks ago that was normal.  Past medical/surgical history that increases complexity of ED encounter: Hypertension  Interpretation of Diagnostics I personally reviewed the EKG and my interpretation is as follows: Sinus rhythm  Labs reveal hyponatremia and hypochloremia  Patient Reassessment and Ultimate Disposition/Management     Patient is ambulating without issue after fluids, no emergent process is evident this evening, appropriate for discharge.  Patient management required discussion with the following services or consulting groups:  None  Complexity of Problems Addressed Acute illness or injury that poses threat of life of bodily function  Additional Data Reviewed and Analyzed Further history obtained from: Further history from spouse/family member  Additional Factors Impacting ED Encounter Risk Prescriptions  Elmer Sow. Pilar Plate, MD [facility] [email or web address]  Final Clinical Impressions(s) / ED Diagnoses     ICD-10-CM   1. Acute nonintractable headache, unspecified headache type  R51.9     2. Malaise  R53.81     3. Hyponatremia  E87.1       ED Discharge Orders          Ordered    losartan (COZAAR) 100 MG tablet  Daily        08/20/22 0204             Discharge Instructions Discussed with and Provided to Patient:     Discharge Instructions      You were evaluated in the Emergency Department and after careful evaluation,  we did not find any emergent condition requiring admission or further testing in the hospital.  Your exam/testing today is overall reassuring.  Symptoms may be due to dehydration and low sodium levels.  Increase the salt in your diet.  Drink plenty of fluids.  Recommend stopping your diuretic blood pressure medicine and instead take the losartan medication.  Follow-up with your primary care doctor if not feeling better over the next few days with these changes.  Please return to the Emergency Department if you experience any worsening of your condition.   Thank you for allowing [place] to be a part of your care.       Sabas Sous, MD 08/20/22 [phone]

## 2022-08-20 NOTE — Discharge Instructions (Signed)
You were evaluated in the Emergency Department and after careful evaluation, we did not find any emergent condition requiring admission or further testing in the hospital.  Your exam/testing today is overall reassuring.  Symptoms may be due to dehydration and low sodium levels.  Increase the salt in your diet.  Drink plenty of fluids.  Recommend stopping your diuretic blood pressure medicine and instead take the losartan medication.  Follow-up with your primary care doctor if not feeling better over the next few days with these changes.  Please return to the Emergency Department if you experience any worsening of your condition.   Thank you for allowing [place] to be a part of your care.

## 2022-08-20 NOTE — Telephone Encounter (Signed)
Unable to reach patient by phone. I have sent a mychart message since it appears she is active on it. Will await patient response.

## 2022-08-21 ENCOUNTER — Encounter: Payer: Self-pay | Admitting: Family Medicine

## 2022-08-21 ENCOUNTER — Other Ambulatory Visit: Payer: Self-pay | Admitting: *Deleted

## 2022-08-21 DIAGNOSIS — R1084 Generalized abdominal pain: Secondary | ICD-10-CM

## 2022-08-21 DIAGNOSIS — R1013 Epigastric pain: Secondary | ICD-10-CM

## 2022-08-21 DIAGNOSIS — R110 Nausea: Secondary | ICD-10-CM

## 2022-08-26 ENCOUNTER — Telehealth: Payer: Self-pay | Admitting: Internal Medicine

## 2022-08-26 NOTE — Telephone Encounter (Signed)
I spoke with the patient and she reported Chest pain/heaviness, palpitations but reports no SOB or difficulty breathing. Patient also reported weakness and dizziness that she states has been chronic. Patient reports no sudden vision changes at this time. I informed the patient to seek emergency care at this time but patient declined and stated she has been multiple times previously and would just like to follow up with PCP. I informed the patient  again that she should proceed to the ED immediately but she stated she would go if her symptoms worsened. Message sent to PCP.

## 2022-08-26 NOTE — Telephone Encounter (Signed)
Ally called and stated that pt was having chest heaviness, weakness, dizziness, headache for over a mth; SX are getting worse. Heart palpitations started yesterday and feels like cardiac px.   Pt declined triage outcome which was to cal 911; pt stated they don't feel like sitting in the ER.   Please advise.

## 2022-08-31 ENCOUNTER — Ambulatory Visit (HOSPITAL_COMMUNITY)
Admission: RE | Admit: 2022-08-31 | Discharge: 2022-08-31 | Disposition: A | Discharge status: Home / Self Care | Payer: Medicare PPO | Source: Ambulatory Visit | Attending: Nurse Practitioner | Admitting: Nurse Practitioner

## 2022-08-31 ENCOUNTER — Telehealth: Payer: Self-pay

## 2022-08-31 DIAGNOSIS — R1013 Epigastric pain: Secondary | ICD-10-CM | POA: Insufficient documentation

## 2022-08-31 DIAGNOSIS — R1084 Generalized abdominal pain: Secondary | ICD-10-CM | POA: Insufficient documentation

## 2022-08-31 DIAGNOSIS — R110 Nausea: Secondary | ICD-10-CM | POA: Insufficient documentation

## 2022-08-31 DIAGNOSIS — R109 Unspecified abdominal pain: Secondary | ICD-10-CM | POA: Diagnosis not present

## 2022-08-31 NOTE — Telephone Encounter (Signed)
Transition Care Management Follow-up Telephone Call Date of discharge and from where: Donna [facility]  How have you been since you were released from the hospital? Doing well  Any questions or concerns? No  Items Reviewed: Did the pt receive and understand the discharge instructions provided? Yes  Medications obtained and verified? Yes  Other? No  Any new allergies since your discharge? No  Dietary orders reviewed? No Do you have support at home? Yes     Follow up appointments reviewed:  PCP Hospital f/u appt confirmed? Yes  Scheduled to see  on  @ . Specialist Hospital f/u appt confirmed? No  Scheduled to see  on  @ . Are transportation arrangements needed? No  If their condition worsens, is the pt aware to call PCP or go to the Emergency Dept.? Yes Was the patient provided with contact information for the PCP's office or ED? Yes Was to pt encouraged to call back with questions or concerns? Yes

## 2022-08-31 NOTE — Telephone Encounter (Signed)
Transition Care Management Unsuccessful Follow-up Telephone Call  Date of discharge and from where:  Donna Hawkins 6/27  Attempts:  1st Attempt  Reason for unsuccessful TCM follow-up call:  Left voice message   Lenard Forth [facility] Guide, [facility] [phone] [address] [place], [place], [place] [address] Phone: [phone] Email: [email or web address]

## 2022-09-01 ENCOUNTER — Ambulatory Visit: Payer: Medicare PPO | Admitting: Internal Medicine

## 2022-09-01 ENCOUNTER — Telehealth: Payer: Self-pay | Admitting: Internal Medicine

## 2022-09-01 ENCOUNTER — Encounter: Payer: Self-pay | Admitting: Internal Medicine

## 2022-09-01 VITALS — BP 155/103 | HR 81 | Temp 98.2°F | Wt 126.8 lb

## 2022-09-01 DIAGNOSIS — F419 Anxiety disorder, unspecified: Secondary | ICD-10-CM

## 2022-09-01 DIAGNOSIS — I10 Essential (primary) hypertension: Secondary | ICD-10-CM

## 2022-09-01 DIAGNOSIS — E038 Other specified hypothyroidism: Secondary | ICD-10-CM

## 2022-09-01 MED ORDER — SERTRALINE HCL 50 MG PO TABS
50.0000 mg | ORAL_TABLET | Freq: Every day | ORAL | 1 refills | Status: DC
Start: 2022-09-01 — End: 2022-09-02

## 2022-09-01 NOTE — Assessment & Plan Note (Signed)
Check thyroid function studies today.  Wonder if her weakness could be related to now frank hypothyroidism.

## 2022-09-01 NOTE — Assessment & Plan Note (Signed)
She is known to have whitecoat syndrome, she has had some elevated blood pressure measurements recently.  I believe this is related more to her current state of anxiety than actual hypertension.  She continues on losartan 100 mg daily.

## 2022-09-01 NOTE — Telephone Encounter (Signed)
Pt can not swallow pills and medication can not be crushed nor chewed . Pt need sertraline (ZOLOFT) 50 MG tablet in liquid   [id number] [place], [place] - [address] Phone: [phone]  Fax: [phone]

## 2022-09-01 NOTE — Assessment & Plan Note (Signed)
Very elevated PHQ-9 and GAD scores today.  I think this is driving her recent elevated blood pressure and shakiness.  Xanax alone is not very effective.  Start sertraline 50 mg daily.  Return in 6 weeks for follow-up.

## 2022-09-01 NOTE — Progress Notes (Signed)
Established Patient Office Visit     CC/Reason for Visit: Discuss past 4 weeks of symptoms.  HPI: Donna Hawkins is a 67 y.o. female who is coming in today for the above mentioned reasons. Past Medical History is significant for: Osteoporosis, insomnia, hypertension, GERD, subclinical hypothyroidism.  She is not on levothyroxine.  For the past 3 to 4 weeks she has been having headaches, episodes of elevated blood pressure, shakiness and increased anxiety.  She has now become fearful of checking her blood pressure because she believes it will be elevated.  She did have a visit to the emergency department on June 14, had a CT of the head that was normal.  Recent blood pressure measurements below:        Past Medical/Surgical History: Past Medical History:  Diagnosis Date   Anxiety    Appendicitis 09/25/2013   Basal cell carcinoma    Blood in stool    Chicken pox    Dysphagia    GERD (gastroesophageal reflux disease)    High blood pressure    HLD (hyperlipidemia)    Mitral valve prolapse    Osteoporosis     Past Surgical History:  Procedure Laterality Date   APPENDECTOMY  2015   Open appendectomy with right colectomy   BREAST BIOPSY Right 2001   benign   COLON SURGERY  2015   right hemicolectomy done at time of appendectomy   LAPAROSCOPIC APPENDECTOMY N/A 09/25/2013   Procedure:  LAPAROSCOPIC APPENDECTOMY CONVERTED TO LAPAROSCOPIC ASSISTED RIGHT COLECTOMY;  Surgeon: Atilano Ina, MD;  Location: [facility] OR;  Service: General;  Laterality: N/A;   TONSILLECTOMY      Social History:  reports that she has never smoked. She has never used smokeless tobacco. She reports current alcohol use. She reports that she does not use drugs.  Allergies: No Known Allergies  Family History:  Family History  Problem Relation Age of Onset   Stroke Mother 61       brain hemorrhage   Irritable bowel syndrome Mother    Hyperlipidemia Mother    COPD Father    Ovarian cancer  Maternal Grandmother    Diabetes Paternal Grandmother    Heart disease Paternal Grandfather 42       MI   Hypertension Daughter    Hypertension Son    Autoimmune disease Neg Hx    Colon cancer Neg Hx    Rectal cancer Neg Hx    Stomach cancer Neg Hx    Esophageal cancer Neg Hx      Current Outpatient Medications:    ALPRAZolam (XANAX) 0.25 MG tablet, Take 1 tablet (0.25 mg total) by mouth 2 (two) times daily as needed for anxiety or sleep., Disp: 60 tablet, Rfl: 2   ascorbic acid (VITAMIN C) 500 MG tablet, Take 500 mg by mouth daily., Disp: , Rfl:    bismuth subsalicylate (PEPTO BISMOL) 262 MG/15ML suspension, Take 30 mLs by mouth at bedtime., Disp: , Rfl:    Cholecalciferol (VITAMIN D3 PO), Take 2,000 Units by mouth daily., Disp: , Rfl:    losartan (COZAAR) 100 MG tablet, Take 1 tablet (100 mg total) by mouth daily., Disp: 30 tablet, Rfl: 0   Menaquinone-7 (VITAMIN K2 PO), Take by mouth daily., Disp: , Rfl:    omeprazole (PRILOSEC) 20 MG capsule, Take 1 capsule (20 mg total) by mouth daily., Disp: 30 capsule, Rfl: 6   sertraline (ZOLOFT) 50 MG tablet, Take 1 tablet (50 mg total) by mouth daily.,  Disp: 90 tablet, Rfl: 1  Review of Systems:  Negative unless indicated in HPI.   Physical Exam: Vitals:   09/01/22 1438 09/01/22 1441  BP: (!) 160/100 (!) 155/103  Pulse: 81   Temp: 98.2 F (36.8 C)   TempSrc: Oral   SpO2: 98%   Weight: 126 lb 12.8 oz (57.5 kg)     Body mass index is 19.86 kg/m.   Physical Exam Vitals reviewed.  Constitutional:      Appearance: Normal appearance.  HENT:     Head: Normocephalic and atraumatic.  Eyes:     Conjunctiva/sclera: Conjunctivae normal.     Pupils: Pupils are equal, round, and reactive to light.  Cardiovascular:     Rate and Rhythm: Normal rate and regular rhythm.  Pulmonary:     Effort: Pulmonary effort is normal.     Breath sounds: Normal breath sounds.  Skin:    General: Skin is warm and dry.  Neurological:     General:  No focal deficit present.     Mental Status: She is alert and oriented to person, place, and time.  Psychiatric:        Mood and Affect: Mood normal.        Behavior: Behavior normal.        Thought Content: Thought content normal.        Judgment: Judgment normal.     Flowsheet Row Office Visit from 09/01/2022 in [facility]  PHQ-9 Total Score 16         09/01/2022    2:48 PM 08/14/2022    9:09 AM  GAD 7 : Generalized Anxiety Score  Nervous, Anxious, on Edge 3 3  Control/stop worrying 3 2  Worry too much - different things 3 2  Trouble relaxing 3 2  Restless 1 0  Easily annoyed or irritable 1 1  Afraid - awful might happen 3 2  Total GAD 7 Score 17 12  Anxiety Difficulty Extremely difficult Very difficult      Impression and Plan:  Anxiety Assessment & Plan: Very elevated PHQ-9 and GAD scores today.  I think this is driving her recent elevated blood pressure and shakiness.  Xanax alone is not very effective.  Start sertraline 50 mg daily.  Return in 6 weeks for follow-up.  Orders: -     Sertraline HCl; Take 1 tablet (50 mg total) by mouth daily.  Dispense: 90 tablet; Refill: 1 -     Vitamin B12; Future -     VITAMIN D 25 Hydroxy (Vit-D Deficiency, Fractures); Future  Primary hypertension Assessment & Plan: She is known to have whitecoat syndrome, she has had some elevated blood pressure measurements recently.  I believe this is related more to her current state of anxiety than actual hypertension.  She continues on losartan 100 mg daily.  Orders: -     CBC with Differential/Platelet; Future -     Comprehensive metabolic panel; Future  Subclinical hypothyroidism Assessment & Plan: Check thyroid function studies today.  Wonder if her weakness could be related to now frank hypothyroidism.  Orders: -     TSH; Future -     T4, free; Future -     T3, free; Future     Time spent:34 minutes reviewing chart, interviewing and examining  patient and formulating plan of care.     Chaya Jan, MD [facility]

## 2022-09-02 ENCOUNTER — Other Ambulatory Visit: Payer: Self-pay | Admitting: Internal Medicine

## 2022-09-02 DIAGNOSIS — F419 Anxiety disorder, unspecified: Secondary | ICD-10-CM

## 2022-09-02 LAB — COMPREHENSIVE METABOLIC PANEL
ALT: 17 U/L (ref 0–35)
AST: 20 U/L (ref 0–37)
Albumin: 4.7 g/dL (ref 3.5–5.2)
Alkaline Phosphatase: 87 U/L (ref 39–117)
BUN: 11 mg/dL (ref 6–23)
CO2: 22 mEq/L (ref 19–32)
Calcium: 10.3 mg/dL (ref 8.4–10.5)
Chloride: 98 mEq/L (ref 96–112)
Creatinine, Ser: 0.78 mg/dL (ref 0.40–1.20)
GFR: 78.08 mL/min (ref >60.00)
Glucose, Bld: 94 mg/dL (ref 70–99)
Potassium: 4.3 mEq/L (ref 3.5–5.1)
Sodium: 132 mEq/L — ABNORMAL LOW (ref 135–145)
Total Bilirubin: 0.6 mg/dL (ref 0.2–1.2)
Total Protein: 7.6 g/dL (ref 6.0–8.3)

## 2022-09-02 LAB — CBC WITH DIFFERENTIAL/PLATELET
Basophils Absolute: 0.1 10*3/uL (ref 0.0–0.1)
Basophils Relative: 1.2 % (ref 0.0–3.0)
Eosinophils Absolute: 0.1 10*3/uL (ref 0.0–0.7)
Eosinophils Relative: 1.3 % (ref 0.0–5.0)
HCT: 39.9 % (ref 36.0–46.0)
Hemoglobin: 13.5 g/dL (ref 12.0–15.0)
Lymphocytes Relative: 20.8 % (ref 12.0–46.0)
Lymphs Abs: 2.3 10*3/uL (ref 0.7–4.0)
MCHC: 33.8 g/dL (ref 30.0–36.0)
MCV: 92.3 fl (ref 78.0–100.0)
Monocytes Absolute: 0.9 10*3/uL (ref 0.1–1.0)
Monocytes Relative: 8.3 % (ref 3.0–12.0)
Neutro Abs: 7.6 10*3/uL (ref 1.4–7.7)
Neutrophils Relative %: 68.4 % (ref 43.0–77.0)
Platelets: 424 10*3/uL — ABNORMAL HIGH (ref 150.0–400.0)
RBC: 4.32 Mil/uL (ref 3.87–5.11)
RDW: 13.5 % (ref 11.5–15.5)
WBC: 11.1 10*3/uL — ABNORMAL HIGH (ref 4.0–10.5)

## 2022-09-02 LAB — T3, FREE: T3, Free: 3.1 pg/mL (ref 2.3–4.2)

## 2022-09-02 LAB — TSH: TSH: 3.55 u[IU]/mL (ref 0.35–5.50)

## 2022-09-02 LAB — T4, FREE: Free T4: 1.11 ng/dL (ref 0.60–1.60)

## 2022-09-02 LAB — VITAMIN B12: Vitamin B-12: 954 pg/mL — ABNORMAL HIGH (ref 211–911)

## 2022-09-02 LAB — VITAMIN D 25 HYDROXY (VIT D DEFICIENCY, FRACTURES): VITD: 58.32 ng/mL (ref 30.00–100.00)

## 2022-09-02 MED ORDER — SERTRALINE HCL 20 MG/ML PO CONC
50.0000 mg | Freq: Every day | ORAL | 12 refills | Status: DC
Start: 2022-09-02 — End: 2022-09-04

## 2022-09-03 ENCOUNTER — Telehealth: Payer: Self-pay | Admitting: Internal Medicine

## 2022-09-03 NOTE — Telephone Encounter (Signed)
Pt husband call and stated pt have had diarrhea 5 times today he stated it is coming from taken sertraline (ZOLOFT) 20 MG/ML concentrated solution he want a call back on [phone].

## 2022-09-03 NOTE — Telephone Encounter (Signed)
Husband is aware.

## 2022-09-04 ENCOUNTER — Ambulatory Visit: Payer: Medicare PPO | Admitting: Family Medicine

## 2022-09-04 ENCOUNTER — Encounter: Payer: Self-pay | Admitting: Family Medicine

## 2022-09-04 VITALS — BP 160/118 | HR 90 | Temp 97.9°F | Wt 125.6 lb

## 2022-09-04 DIAGNOSIS — E871 Hypo-osmolality and hyponatremia: Secondary | ICD-10-CM

## 2022-09-04 DIAGNOSIS — K521 Toxic gastroenteritis and colitis: Secondary | ICD-10-CM | POA: Diagnosis not present

## 2022-09-04 DIAGNOSIS — F411 Generalized anxiety disorder: Secondary | ICD-10-CM | POA: Diagnosis not present

## 2022-09-04 DIAGNOSIS — I10 Essential (primary) hypertension: Secondary | ICD-10-CM

## 2022-09-04 DIAGNOSIS — T7840XA Allergy, unspecified, initial encounter: Secondary | ICD-10-CM

## 2022-09-04 DIAGNOSIS — R519 Headache, unspecified: Secondary | ICD-10-CM | POA: Diagnosis not present

## 2022-09-04 NOTE — Progress Notes (Signed)
Established Patient Office Visit   Subjective  Patient ID: Kailiana Harrigan, female    DOB: 1955/06/06  Age: 67 y.o. MRN: [id number]  Chief Complaint  Patient presents with   Medication Reaction    Started sertraline yesterday morning, diarrhea (severe), weak and dizzy, heart started pounding, HA different from normal HA she has. Did not sleep well, felt nauseas, weak and shaky. Was taking Xanax PRN, took 1 last night to try and cam the heart, but did not help.   Patient accompanied by her husband.  Patient is a 67 year old female followed by Dr. Ardyth Harps and seen for acute concern.  Patient dealing with elevated BP, headaches, and reactions to medications over the last month.  Pt seen by PCP 09/02/2022, started on sertraline 50 mg daily for anxiety.  Patient states after taking sertraline at 9 AM she developed loose stools shortly after which continued throughout the night patient felt weak, shaky, had palpitations.  Patient tried taking Xanax 0.25 mg that afternoon which did not help symptoms.  Was unable to sleep last night due to symptoms.  BP at home was 122/77 with pulse of 80 09/02/2022, 140/86 with a pulse of 76 after taking sertraline on 09/03/2022, and 175/97 with pulse of 87 at 745 that evening.  Trying to stay hydrated.  Was able to eat small amount.    Past Medical History:  Diagnosis Date   Anxiety    Appendicitis 09/25/2013   Basal cell carcinoma    Blood in stool    Chicken pox    Dysphagia    GERD (gastroesophageal reflux disease)    High blood pressure    HLD (hyperlipidemia)    Mitral valve prolapse    Osteoporosis    Past Surgical History:  Procedure Laterality Date   APPENDECTOMY  2015   Open appendectomy with right colectomy   BREAST BIOPSY Right 2001   benign   COLON SURGERY  2015   right hemicolectomy done at time of appendectomy   LAPAROSCOPIC APPENDECTOMY N/A 09/25/2013   Procedure:  LAPAROSCOPIC APPENDECTOMY CONVERTED TO LAPAROSCOPIC ASSISTED RIGHT  COLECTOMY;  Surgeon: Atilano Ina, MD;  Location: [facility] OR;  Service: General;  Laterality: N/A;   TONSILLECTOMY     Social History   Tobacco Use   Smoking status: Never   Smokeless tobacco: Never  Vaping Use   Vaping status: Never Used  Substance Use Topics   Alcohol use: Yes    Comment: 1 glass of wine once a week   Drug use: No   Family History  Problem Relation Age of Onset   Stroke Mother 52       brain hemorrhage   Irritable bowel syndrome Mother    Hyperlipidemia Mother    COPD Father    Ovarian cancer Maternal Grandmother    Diabetes Paternal Grandmother    Heart disease Paternal Grandfather 73       MI   Hypertension Daughter    Hypertension Son    Autoimmune disease Neg Hx    Colon cancer Neg Hx    Rectal cancer Neg Hx    Stomach cancer Neg Hx    Esophageal cancer Neg Hx    No Known Allergies    ROS Negative unless stated above    Objective:     BP (!) 160/118 (BP Location: Right Arm, Patient Position: Sitting, Cuff Size: Normal)   Pulse 90   Temp 97.9 F (36.6 C) (Oral)   Wt 125 lb 9.6 oz (57  kg)   SpO2 95%   BMI 19.67 kg/m  BP Readings from Last 3 Encounters:  09/04/22 (!) 160/118  09/01/22 (!) 155/103  08/20/22 (!) 147/76   Wt Readings from Last 3 Encounters:  09/04/22 125 lb 9.6 oz (57 kg)  09/01/22 126 lb 12.8 oz (57.5 kg)  08/19/22 130 lb 15.3 oz (59.4 kg)      Physical Exam Constitutional:      General: She is awake. She is not in acute distress.    Appearance: Normal appearance. She is ill-appearing.  HENT:     Head: Normocephalic and atraumatic.     Nose: Nose normal.     Mouth/Throat:     Mouth: Mucous membranes are moist.  Eyes:     Extraocular Movements: Extraocular movements intact.     Conjunctiva/sclera: Conjunctivae normal.     Pupils: Pupils are equal, round, and reactive to light.  Cardiovascular:     Rate and Rhythm: Normal rate and regular rhythm.     Heart sounds: Normal heart sounds. No murmur heard.    No  gallop.  Pulmonary:     Effort: Pulmonary effort is normal. No respiratory distress.     Breath sounds: Normal breath sounds. No wheezing, rhonchi or rales.  Skin:    General: Skin is warm and dry.  Neurological:     Mental Status: She is alert and oriented to person, place, and time.  Psychiatric:        Behavior: Behavior is cooperative.       09/01/2022    2:45 PM 08/14/2022    9:09 AM 03/09/2022    4:06 PM  Depression screen PHQ 2/9  Decreased Interest 3 0 0  Down, Depressed, Hopeless 2 1 0  PHQ - 2 Score 5 1 0  Altered sleeping 3 2 3   Tired, decreased energy 3 3 0  Change in appetite  0 0  Feeling bad or failure about yourself  0 0 0  Trouble concentrating 2 0 0  Moving slowly or fidgety/restless 3 0 0  Suicidal thoughts 0 0 0  PHQ-9 Score 16 6 3   Difficult doing work/chores Extremely dIfficult Very difficult Not difficult at all      09/01/2022    2:48 PM 08/14/2022    9:09 AM  GAD 7 : Generalized Anxiety Score  Nervous, Anxious, on Edge 3 3  Control/stop worrying 3 2  Worry too much - different things 3 2  Trouble relaxing 3 2  Restless 1 0  Easily annoyed or irritable 1 1  Afraid - awful might happen 3 2  Total GAD 7 Score 17 12  Anxiety Difficulty Extremely difficult Very difficult   No results found for any visits on 09/04/22.    Assessment & Plan:  GAD (generalized anxiety disorder)  Essential hypertension  Sensitivity to medication, initial encounter  Diarrhea due to drug  Acute nonintractable headache, unspecified headache type -Likely multifactorial including HTN, hyponatremia, medication sensitivities, etc. -CT head 08/07/2022 negative for acute pathology  Hyponatremia -Possibly contributing to headache -Sodium 125 on 08/19/2022 in ED -Sodium 130 217/9/24  Acute concern of diarrhea, fatigue, elevated BP due to sertraline.  Will have patient discontinue sertraline 20 mg/mL solution.  Given recent reactions to medications pt likely sensitive to  medication versus a slow metabolizer of medicines.  Symptoms less likely due to serotonin syndrome.  Use lowest possible doses moving forward.  Consider avoiding SSRIs as Prozac recently caused elevated BP and increased headache.  Advise headaches  likely multifactorial including elevation in blood pressure, hyponatremia, and medication intolerance.  Continue hydration, advance diet as tolerated.  Given strict ED precautions.  Return in about 1 week (around 09/11/2022), or if symptoms worsen or fail to improve.   Deeann Saint, MD

## 2022-09-07 ENCOUNTER — Encounter: Payer: Self-pay | Admitting: Internal Medicine

## 2022-09-07 DIAGNOSIS — T7840XA Allergy, unspecified, initial encounter: Secondary | ICD-10-CM

## 2022-09-07 DIAGNOSIS — K521 Toxic gastroenteritis and colitis: Secondary | ICD-10-CM

## 2022-09-07 DIAGNOSIS — E038 Other specified hypothyroidism: Secondary | ICD-10-CM

## 2022-09-08 ENCOUNTER — Ambulatory Visit: Payer: Medicare PPO | Admitting: Internal Medicine

## 2022-09-21 ENCOUNTER — Telehealth: Payer: Self-pay | Admitting: Internal Medicine

## 2022-09-21 NOTE — Telephone Encounter (Signed)
Pt husband is calling and his wife would like to switch to dr Casimiro Needle. Please advise

## 2022-09-22 ENCOUNTER — Telehealth: Payer: Self-pay | Admitting: Internal Medicine

## 2022-09-22 ENCOUNTER — Other Ambulatory Visit: Payer: Self-pay | Admitting: Internal Medicine

## 2022-09-22 DIAGNOSIS — G4700 Insomnia, unspecified: Secondary | ICD-10-CM

## 2022-09-22 MED ORDER — ALPRAZOLAM 0.25 MG PO TABS
0.2500 mg | ORAL_TABLET | Freq: Two times a day (BID) | ORAL | 2 refills | Status: DC | PRN
Start: 2022-09-22 — End: 2022-12-30

## 2022-09-22 NOTE — Telephone Encounter (Signed)
Pt transferring to dr Casimiro Needle on 10-30-2022 and would like a refill on ALPRAZolam Prudy Feeler) 0.25 MG tablet  [id number] [place], [place] - [address] Phone: [phone]  Fax: [phone]    Pt also cancelled her appt with dr Ardyth Harps next month

## 2022-09-22 NOTE — Telephone Encounter (Signed)
Patient is on xanax as needed for her anxiety. I generally try to avoid using this medication regularly for anxiety without any other medications accompanying it, or referring her to psychiatry to continue the medication. Would the patient be open to a referral to psychiatry?

## 2022-09-22 NOTE — Telephone Encounter (Signed)
Patient is aware and states that she "does not want to be on it for a long time".  Patient is aware that a referral may be needed for her anxiety and depression.  Patient prefers to transfer to Dr Casimiro Needle.

## 2022-09-22 NOTE — Telephone Encounter (Signed)
Ok with me 

## 2022-09-22 NOTE — Telephone Encounter (Signed)
Pt has been sch for 10-30-2022

## 2022-09-29 ENCOUNTER — Telehealth: Payer: Self-pay | Admitting: Cardiovascular Disease

## 2022-09-29 NOTE — Telephone Encounter (Signed)
Called pt's husband. He states she has mitral valve prolapse and for the past few weeks her heart rate has been slowing to about 50 bpm in the evenings. Per her husband she is usually relaxing, he is braiding her hair, giving her a neck rub, etc. "When she gets up and moves around her heart rate increases."  Losartan 25 mg on Saturday.  Since reducing the Losartan: 8/3 117/71 hr 62 8/4 112/68 64  114/62 hr 67  126/72 hr 68 09/5126/75 hr 60 123/75 hr 68 116/73 hr 73 124/71 hr 69 Pt has had a slight headache 2 months. Does not respond to pain relieves. Has been seen in ED twice, was given a migraine cocktail with relief for a short time. Has had 2 EKGs, CT of head and 2 chest x-ray with no findings. "She seems tired and weak." Pt has not been able to complete her 1 mile daily walk with the dog since the headache started, "it has gotten worst the last week or so." "She does have white coat syndrome, her blood pressure is always elevated in the office.   Was reduced at home to 75 mg on July 12th, reduced again to 50 mg in July 17th. Reduced to 25 mg Aug 3rd.   Please advise about the hear rate and fatigue.

## 2022-09-29 NOTE — Telephone Encounter (Signed)
Called pt's husband back. Appt made with Carlos Levering Thursday. Husband was thankful for the appt.

## 2022-09-29 NOTE — Telephone Encounter (Signed)
Pt c/o BP issue: STAT if pt c/o blurred vision, one-sided weakness or slurred speech  1. What are your last 5 BP readings?   8/4 - 126/72  HR 68  8/5   8:00 am  128/75  HR 60 11:00 am  123/75  HR 68 1:50 pm 116/73  HR 73 6:45 pm 124/71   HR 69  2. Are you having any other symptoms (ex. Dizziness, headache, blurred vision, passed out)?  Slight headache for about 2 months which does not go away  3. What is your BP issue?    Husband stated patient is concerned that her HR has been slowing down in the evenings between 9:00-1:00 pm.  Husband noted patient's losartan was reduced last Saturday.

## 2022-09-30 NOTE — Progress Notes (Signed)
Cardiology Clinic Note   Date: 10/01/2022 ID: Freja, Hinsey October 23, 1955, MRN [id number]  Primary Cardiologist:  Reatha Harps, MD  Patient Profile    Taneyah Abdelaziz is a 67 y.o. female who presents to the clinic today for evaluation of palpitations and low heart rate.     Past medical history significant for: Mitral valve prolapse. Echo 09/04/2021: EF 65 to 70%.  Normal diastolic parameters.  Normal RV function.  Trivial MR.  Mild prolapse of the mitral valve. Hypertension. Palpitations. 3-day ZIO 04/05/2019: NSR average HR 74 bpm.  PAC/PVC <1% total beats.  1 4 beat run of A. tach.  No arrhythmia with symptoms.  No significant arrhythmias. Subclinical hypothyroidism. S/p colectomy.     History of Present Illness    Brekken Fang evaluated by Dr. Flora Lipps on 05/15/2019 for palpitations at the request of Dr. Hassan Rowan.  Patient complained of a 2 to 9-month history of episodic palpitations described as heart racing.  She wore a 3-day ZIO prior to office visit which showed no significant arrhythmias.  She was in NSR with heart rate in the 60s during triggered events.  Echo showed normal LV function with mild anterior mitral valve prolapse and mild MR.  She was under an increased amount of stress at that time.  She reported symptoms improved with Xanax.  Patient was last seen in the office by Dr. Flora Lipps on 09/04/2021 for routine follow-up.  She reported intermittent episodes of shortness of breath with no particular pattern.  Recent chest CT with concern for small airway disease and asthma.  She reported inhalers have not helped.  She was seen by pulmonology who referred her back to cardiology.  She continued to have intermittent palpitations.  Repeat echo showed mild prolapse of the mitral valve.  Patient was instructed to follow-up in 2 years.  Patient contacted the office on 09/29/2022 with complaints of decreased heart rate at night. Per triage RN: "Called pt's  husband. He states she has mitral valve prolapse and for the past few weeks her heart rate has been slowing to about 50 bpm in the evenings. Per her husband she is usually relaxing, he is braiding her hair, giving her a neck rub, etc. "When she gets up and moves around her heart rate increases."  Losartan 25 mg on Saturday.  Since reducing the Losartan: 8/3 117/71 hr 62 8/4 112/68 64  114/62 hr 67  126/72 hr 68 09/5126/75 hr 60 123/75 hr 68 116/73 hr 73 124/71 hr 69 Pt has had a slight headache 2 months. Does not respond to pain relieves. Has been seen in ED twice, was given a migraine cocktail with relief for a short time. Has had 2 EKGs, CT of head and 2 chest x-ray with no findings. "She seems tired and weak." Pt has not been able to complete her 1 mile daily walk with the dog since the headache started, "it has gotten worst the last week or so." "She does have white coat syndrome, her blood pressure is always elevated in the office.   Was reduced at home to 75 mg on July 12th, reduced again to 50 mg in July 17th. Reduced to 25 mg Aug 3rd.   Please advise about the hear rate and fatigue." Dr. Flora Lipps recommended patient come in for an EKG.   Today, patient is accompanied by her husband. She has a constellation of complaints today. She reports a 2 months ago she developed a headache and started noticing increased  BP. She was evaluated in the ED and antihypertensives were adjusted. She underwent a head CT which was normal. She continues to have a constant headache that was located in the right occiput put has become more global in the last several days. It is described as mild in intensity. She also reports a 2 month history of random shortness of breath that is not worsened with exertion. She also has vague chest heaviness that comes on randomly. She was walking 1 mile a day but has since stopped doing so since her headaches started. She reports increased episodes of palpitations described as racing,  pounding and "flip flopping" that are more persistent than when she was first seen in 2021. Palpitations keep her up at night although she reports a long history of insomnia. She also reports tingling down her left arm with or without chest heaviness. She has a history of neck issues. She also gets tingling of her lower legs but is not aware of lower spinal issues. After increase BP medications her BP began to be low so she slowly decreased medications to lowest dose of losartan. Home BP consisently <130/80. She also reports a few days history of heart rate dropping to 50 (lowest reading) while at rest. When asked if she was dizzy with her heart rate in the 50s she states "I am always dizzy and nauseated." She admits to a history of anxiety for which she was unable to tolerate an SSRI. Patient becomes visibly emotional during exam.     ROS: All other systems reviewed and are otherwise negative except as noted in History of Present Illness.  Studies Reviewed    EKG Interpretation Date/Time:  Thursday October 01 2022 10:15:19 EDT Ventricular Rate:  74 PR Interval:  172 QRS Duration:  100 QT Interval:  390 QTC Calculation: 432 R Axis:   43  Text Interpretation: Normal sinus rhythm Incomplete right bundle branch block When compared with ECG of 19-Aug-2022 23:50, Premature atrial complexes are no longer Present Confirmed by Carlos Levering [id number]) on 10/01/2022 10:19:51 AM    Risk Assessment/Calculations      HYPERTENSION CONTROL Vitals:   10/01/22 1005 10/01/22 1302  BP: (!) 144/98 (!) 142/86    The patient's blood pressure is elevated above target today.  In order to address the patient's elevated BP: Blood pressure will be monitored at home to determine if medication changes need to be made.; The blood pressure is usually elevated in clinic.  Blood pressures monitored at home have been optimal.           Physical Exam    VS:  BP (!) 144/98   Pulse 98   Ht 5\' 7"  (1.702 m)   Wt  131 lb 9.6 oz (59.7 kg)   SpO2 98%   BMI 20.61 kg/m  , BMI Body mass index is 20.61 kg/m.  GEN: Well nourished, well developed, in no acute distress. Neck: No JVD or carotid bruits. Cardiac:  RRR. No murmurs. No rubs or gallops.   Respiratory:  Respirations regular and unlabored. Clear to auscultation without rales, wheezing or rhonchi. GI: Soft, nontender, nondistended. Extremities: Radials/DP/PT 2+ and equal bilaterally. No clubbing or cyanosis. No edema.  Skin: Warm and dry, no rash. Neuro: Strength intact. Psychiatric: Anxious/tearful affect.   Assessment & Plan    Palpitations/Bradycardia. Patient with complaints of decreasing heart rate in the evenings. Patient states heart rate to 50 while at rest. Unsure if she experienced dizziness as she reports "always dizzy and nauseated." She  also reports increased frequency and intensity of palpitations keeping her up at night but also happening during the day. Palpitations described as pounding, racing, "flip flopping." EKG today shows NSR with incomplete RBBB and no ectopy. RRR on exam today. Will get 2 week zio for further evaluation.  Mitral valve prolapse. Echo July 2023 showed mild mitral valve prolapse. Patient has various complaints today. No murmur auscultated on my exam. I do not feel a repeat echo is indicated at this time.  Hypertension: BP today 144/98 on intake and 142/86 on my recheck. Home BP consistently under 130/80.  Continue Losartan.  Headache. Patient reports constant headache for 2 months. It was in the right occipital area but has become more global over the last several days. She had a negative head CT in the ED June 2024. Will refer to neurology.   Disposition: 2 week zio. Refer to neurology. Return in 1 year or sooner as needed.          Signed, Etta Grandchild. Owenn Rothermel, DNP, NP-C

## 2022-10-01 ENCOUNTER — Ambulatory Visit: Payer: Medicare PPO | Attending: Student | Admitting: Student

## 2022-10-01 ENCOUNTER — Other Ambulatory Visit: Payer: Self-pay | Admitting: Student

## 2022-10-01 ENCOUNTER — Encounter: Payer: Self-pay | Admitting: Student

## 2022-10-01 ENCOUNTER — Telehealth: Payer: Self-pay | Admitting: Student

## 2022-10-01 VITALS — BP 142/86 | HR 98 | Ht 67.0 in | Wt 131.6 lb

## 2022-10-01 DIAGNOSIS — R519 Headache, unspecified: Secondary | ICD-10-CM

## 2022-10-01 DIAGNOSIS — I341 Nonrheumatic mitral (valve) prolapse: Secondary | ICD-10-CM | POA: Diagnosis not present

## 2022-10-01 DIAGNOSIS — R001 Bradycardia, unspecified: Secondary | ICD-10-CM | POA: Diagnosis not present

## 2022-10-01 DIAGNOSIS — I10 Essential (primary) hypertension: Secondary | ICD-10-CM | POA: Diagnosis not present

## 2022-10-01 DIAGNOSIS — R002 Palpitations: Secondary | ICD-10-CM

## 2022-10-01 NOTE — Telephone Encounter (Signed)
Forwarding to Provider and his nurse.

## 2022-10-01 NOTE — Telephone Encounter (Signed)
Pt had an office visit today and an order for a monitor was placed. Well after getting home, her husband Nadine Counts called stating they'd rethought about wearing the monitor for now and would like to put ordering it on hold due to her having other appts next week with specialists. I reached out to [facility] to see if the order could be placed on hold and she stated that was fine but they'd need to let us know what they decide within 2 to 3 weeks regarding moving forward or not with the monitor.

## 2022-10-01 NOTE — Patient Instructions (Signed)
Medication Instructions:  No medication changes *If you need a refill on your cardiac medications before your next appointment, please call your pharmacy*   Lab Work: none If you have labs (blood work) drawn today and your tests are completely normal, you will receive your results only by: MyChart Message (if you have MyChart) OR A paper copy in the mail If you have any lab test that is abnormal or we need to change your treatment, we will call you to review the results.   Testing/Procedures:  Christena Deem- Long Term Monitor Instructions   Your physician has requested you wear your ZIO patch monitor___14____days.   This is a single patch monitor.  [employer] supplies one patch monitor per enrollment.  Additional stickers are not available.   Please do not apply patch if you will be having a Nuclear Stress Test, Echocardiogram, Cardiac CT, MRI, or Chest Xray during the time frame you would be wearing the monitor. The patch cannot be worn during these tests.  You cannot remove and re-apply the ZIO XT patch monitor.   Your ZIO patch monitor will be sent USPS Priority mail from [facility] directly to your home address. The monitor may also be mailed to a PO BOX if home delivery is not available.   It may take 3-5 days to receive your monitor after you have been enrolled.   Once you have received you monitor, please review enclosed instructions.  Your monitor has already been registered assigning a specific monitor serial # to you.   Applying the monitor   Shave hair from upper left chest.   Hold abrader disc by orange tab.  Rub abrader in 40 strokes over left upper chest as indicated in your monitor instructions.   Clean area with 4 enclosed alcohol pads .  Use all pads to assure are is cleaned thoroughly.  Let dry.   Apply patch as indicated in monitor instructions.  Patch will be place under collarbone on left side of chest with arrow pointing upward.   Rub patch adhesive wings  for 2 minutes.Remove white label marked "1".  Remove white label marked "2".  Rub patch adhesive wings for 2 additional minutes.   While looking in a mirror, press and release button in center of patch.  A small green light will flash 3-4 times .  This will be your only indicator the monitor has been turned on.     Do not shower for the first 24 hours.  You may shower after the first 24 hours.   Press button if you feel a symptom. You will hear a small click.  Record Date, Time and Symptom in the Patient Log Book.   When you are ready to remove patch, follow instructions on last 2 pages of Patient Log Book.  Stick patch monitor onto last page of Patient Log Book.   Place Patient Log Book in [place] box.  Use locking tab on box and tape box closed securely.  The [employer] and [employer] has [employer] on it.  Please place in mailbox as soon as possible.  Your physician should have your test results approximately 7 days after the monitor has been mailed back to [facility].   Call [facility] Customer Care at [phone] if you have questions regarding your ZIO XT patch monitor.  Call them immediately if you see an orange light blinking on your monitor.   If your monitor falls off in less than 4 days contact our Monitor department at  [phone].  If your monitor becomes loose or falls off after 4 days call [employer] at [phone] for suggestions on securing your monitor.     Follow-Up: At [facility], you and your health needs are our priority.  As part of our continuing mission to provide you with exceptional heart care, we have created designated Provider Care Teams.  These Care Teams include your primary Cardiologist (physician) and Advanced Practice Providers (APPs -  Physician Assistants and Nurse Practitioners) who all work together to provide you with the care you need, when you need it.  We recommend signing up for the patient portal called "MyChart".  Sign up  information is provided on this After Visit Summary.  MyChart is used to connect with patients for Virtual Visits (Telemedicine).  Patients are able to view lab/test results, encounter notes, upcoming appointments, etc.  Non-urgent messages can be sent to your provider as well.   To learn more about what you can do with MyChart, go to [email or web address].    Your next appointment:   1 year(s)  Provider:   Reatha Harps, MD

## 2022-10-05 ENCOUNTER — Ambulatory Visit: Payer: Medicare PPO | Admitting: Internal Medicine

## 2022-10-05 ENCOUNTER — Encounter: Payer: Self-pay | Admitting: Internal Medicine

## 2022-10-05 VITALS — BP 134/82 | HR 100 | Ht 67.0 in | Wt 132.0 lb

## 2022-10-05 DIAGNOSIS — L280 Lichen simplex chronicus: Secondary | ICD-10-CM | POA: Diagnosis not present

## 2022-10-05 DIAGNOSIS — R519 Headache, unspecified: Secondary | ICD-10-CM

## 2022-10-05 DIAGNOSIS — R5383 Other fatigue: Secondary | ICD-10-CM | POA: Diagnosis not present

## 2022-10-05 MED ORDER — COSYNTROPIN 0.25 MG IJ SOLR
0.2500 mg | Freq: Once | INTRAMUSCULAR | Status: AC
Start: 2022-10-05 — End: 2022-10-05
  Administered 2022-10-05: 0.25 mg via INTRAVENOUS

## 2022-10-05 NOTE — Progress Notes (Unsigned)
After obtaining consent, and per orders of Dr. Lonzo Cloud, injection of Cosyntropin 1mL given by Valorie Roosevelt. Patient instructed to remain in clinic for 20 minutes afterwards, and to report any adverse reaction to me immediately.

## 2022-10-05 NOTE — Progress Notes (Unsigned)
Name: Donna Hawkins  MRN/ DOB: [id number], [birth year]    Age/ Sex: 67 y.o., female    PCP: Philip Aspen, Limmie Patricia, MD   Reason for Endocrinology Evaluation: Osteoporosis      Date of Initial Endocrinology Evaluation: 02/28/2020    HPI: Donna Hawkins is a 68 y.o. female with a past medical history of MVP, GERD, anxiety , S/P right colectomy. The patient presented for initial endocrinology clinic visit on 02/28/2020 for consultative assistance with her Osteoporosis .   Pt was diagnosed with osteoporosis: 05/2019 with a T-score of -3.9 at the spine   Menarche at age : 44 Menopausal at age : 63 Fracture Hx:  no Hx of HRT: yes  Was on a patch  FH of osteoporosis or hip fracture: no Prior Hx of anti-estrogenic therapy : no  Prior Hx of anti-resorptive therapy :  No   On initial visit to our clinic she was not interested in any pharmacological therapy, but rather enroll in any secondary causes   On her initial visit to our clinic she had a vitamin D 83.58 NG/mL, normal serum calcium and GFR, 24-hour urine showed low calcium level and normal metanephrines  SUBJECTIVE:    Today (10/05/22):  Donna Hawkins is here with her spouse to discuss new onset symptoms over the past 2 months.  She has not been to our clinic in 2.5 years.   Patient has been noted with new onset headaches approximately 2 months ago associated with general unwell feeling, fatigue, nighttime chills, vibration sense, spells of feeling shaky and weak as well as dizzy  She also has been noted with pruritus in the left scapular area as well as use, scalp, and neck  She does have nervous and jittery feeling  She follows with cardiology for evaluation of palpitations  Initially had diarrhea followed by constipation  Lost weight initially but now has regained weight  Appetite is good now    She denies steroids    She does not smoke She is on calcium  No ETOH      HISTORY:  Past Medical  History:  Past Medical History:  Diagnosis Date   Anxiety    Appendicitis 09/25/2013   Basal cell carcinoma    Blood in stool    Chicken pox    Dysphagia    GERD (gastroesophageal reflux disease)    High blood pressure    HLD (hyperlipidemia)    Mitral valve prolapse    Osteoporosis    Past Surgical History:  Past Surgical History:  Procedure Laterality Date   APPENDECTOMY  2015   Open appendectomy with right colectomy   BREAST BIOPSY Right 2001   benign   COLON SURGERY  2015   right hemicolectomy done at time of appendectomy   LAPAROSCOPIC APPENDECTOMY N/A 09/25/2013   Procedure:  LAPAROSCOPIC APPENDECTOMY CONVERTED TO LAPAROSCOPIC ASSISTED RIGHT COLECTOMY;  Surgeon: Atilano Ina, MD;  Location: [facility] OR;  Service: General;  Laterality: N/A;   TONSILLECTOMY      Social History:  reports that she has never smoked. She has never used smokeless tobacco. She reports current alcohol use. She reports that she does not use drugs. Family History: family history includes COPD in her father; Diabetes in her paternal grandmother; Heart disease (age of onset: 42) in her paternal grandfather; Hyperlipidemia in her mother; Hypertension in her daughter and son; Irritable bowel syndrome in her mother; Ovarian cancer in her maternal grandmother; Stroke (age of onset: 59)  in her mother.   HOME MEDICATIONS: Allergies as of 10/05/2022   No Known Allergies      Medication List        Accurate as of October 05, 2022  1:57 PM. If you have any questions, ask your nurse or doctor.          ALPRAZolam 0.25 MG tablet Commonly known as: XANAX Take 1 tablet (0.25 mg total) by mouth 2 (two) times daily as needed for anxiety or sleep.   ascorbic acid 500 MG tablet Commonly known as: VITAMIN C Take 500 mg by mouth daily.   bismuth subsalicylate 262 MG/15ML suspension Commonly known as: PEPTO BISMOL Take 30 mLs by mouth at bedtime.   Coral Calcium 1000 (390 Ca) MG Tabs Take 1,000 mg by mouth  once.   losartan 100 MG tablet Commonly known as: COZAAR Take 1 tablet (100 mg total) by mouth daily.   omeprazole 20 MG capsule Commonly known as: PRILOSEC Take 1 capsule (20 mg total) by mouth daily.   VITAMIN D3 PO Take 2,000 Units by mouth daily.   VITAMIN K2 PO Take by mouth daily.          REVIEW OF SYSTEMS: A comprehensive ROS was conducted with the patient and is negative except as per HPI     OBJECTIVE:  VS: BP 134/82 (BP Location: Right Arm, Patient Position: Sitting, Cuff Size: Small)   Pulse 100   Ht 5\' 7"  (1.702 m)   Wt 132 lb (59.9 kg)   SpO2 99%   BMI 20.67 kg/m    Wt Readings from Last 3 Encounters:  10/05/22 132 lb (59.9 kg)  10/01/22 131 lb 9.6 oz (59.7 kg)  09/04/22 125 lb 9.6 oz (57 kg)     EXAM: General: Pt appears well and is in NAD  Neck: General: Supple without adenopathy. Thyroid: Thyroid size normal.  No goiter or nodules appreciated.   Lungs: Clear with good BS bilat   Heart: Auscultation: RRR.  Abdomen: Normoactive bowel sounds, soft, nontender  Extremities:  BL LE: No pretibial edema  Mental Status: Judgment, insight: Intact Orientation: Oriented to time, place, and person Mood and affect: No depression, anxiety, or agitation     DATA REVIEWED:  Cosyntropin stimulation test  Latest Reference Range & Units 10/05/22 14:24 10/05/22 15:12 10/05/22 15:48  Cortisol, Plasma ug/dL 5.4 78.4 69.6    Latest Reference Range & Units 10/05/22 14:24  TSH 0.35 - 5.50 uIU/mL 2.88  Triiodothyronine,Free,Serum 2.3 - 4.2 pg/mL 2.4  T4,Free(Direct) 0.60 - 1.60 ng/dL 2.95    DXA 28/05/1322 ASSESSMENT: The BMD measured at AP Spine L1-L4 is 0.662 g/cm2 with a T-score of -4.3. This patient is considered osteoporotic according to World Health Organization [facility]) criteria.   The quality of the exam is good.   Site Region Measured Date Measured Age YA BMD Significant CHANGE T-score AP Spine L1-L4 01/30/2021 66.7 -4.3 0.662 g/cm2 * AP  Spine L1-L4 06/15/2019 65.1 -3.9 0.710 g/cm2   DualFemur Total Left 01/30/2021 66.7 -3.0 0.625 g/cm2 DualFemur Total Left 06/15/2019 65.1 -2.8 0.649 g/cm2   DualFemur Total Mean 01/30/2021 66.7 -3.0 0.629 g/cm2 DualFemur Total Mean 06/15/2019 65.1 -2.8 0.655 g/cm2   Left Forearm Radius 33% 01/30/2021 66.7 -4.1 0.527 g/cm2    ASSESSMENT/PLAN/RECOMMENDATIONS:   Fatigue :   -Will proceed with pheochromocytoma screening as well as adrenal insufficiency screening -Cosyntropin stimulation test came back normal -Awaiting normetanephrine's   2.Hx of subclinical hypothyroidism :   -TFTs have been normal for  years -Spouse would like to test for anti-TPO antibodies -Her most recent TSH has been normal, I did explain to the patient/spouse that elevated TPO would not explain her symptoms and as long as her TFTs are normal there is no management -TFTs normal   3.  Neurodermatitis:  -She does have hypopigmented area over the left scapula, we discussed differential diagnosis to include due to degenerative joint disease of the neck causing nerve entrapment   4.  Headaches:  -She has a pending referral to neurology -Another differential to add is degenerative joint disease to the neck  F/U pending lab results  Signed electronically by: Lyndle Herrlich, MD  [facility] Endocrinology  [facility] [address] [place] [place], [place] [address] Phone: [phone] FAX: [phone]   CC: Philip Aspen, Limmie Patricia, MD [address] [place] [address] Phone: [phone] Fax: [phone]   Return to Endocrinology clinic as below: Future Appointments  Date Time Provider Department Center  10/05/2022  2:00 PM Sherene Plancarte, Konrad Dolores, MD [id number] None  10/30/2022  2:00 PM Karie Georges, MD [facility]

## 2022-10-06 LAB — CORTISOL: Cortisol, Plasma: 5.4 ug/dL

## 2022-10-06 LAB — T3, FREE: T3, Free: 2.4 pg/mL (ref 2.3–4.2)

## 2022-10-06 LAB — ALBUMIN: Albumin: 4.5 g/dL (ref 3.5–5.2)

## 2022-10-06 NOTE — Progress Notes (Unsigned)
Referring:  Carlos Levering, NP [address] [place],  [place] [address]  PCP: Philip Aspen, Limmie Patricia, MD  Neurology was asked to evaluate Emeri Schimanski, a 67 year old female for a chief complaint of headaches.  Our recommendations of care will be communicated by shared medical record.    CC:  headaches  History provided from self, husband  HPI:  Medical co-morbidities: MVP, GERD, anxiety, s/p right colectomy  The patient presents for evaluation of constant right-sided headaches which began 2 months ago. Headaches are usually mild, but will sometimes become severe enough that she will take medication. Will go away for an hour if she takes OTCs, but then will return soon afterwards. No associated photophobia, phonophobia, or nausea. No vision changes.  When she developed the headache, she felt generally unwell and had low appetite. BP spiked, so she went to the ED where [facility] was normal. Continues to feel lightheaded and dizzy. Sometimes feels like she is "vibrating". Her feet will tingle and burn at night, and it will feel like there is "something in her socks". Will also have tingling, which will travel down her left arm and leg.   Notes that she has had trouble swallowing for several years without a known cause.  Headache History: Onset: June 2024 Aura: no Location: right-sided Quality/Description: pressure Associated Symptoms:  Photophobia: no  Phonophobia: no  Nausea: yes Worse with activity?: no Duration of headaches: constant  Headache days per month: 30 Headache free days per month: 0  Current Treatment: Abortive Ibuprofen  Preventative none  Prior Therapies                                 Losartan Sertraline - diarrhea  LABS: CBC    Component Value Date/Time   WBC 11.1 (H) 09/01/2022 1530   RBC 4.32 09/01/2022 1530   HGB 13.5 09/01/2022 1530   HCT 39.9 09/01/2022 1530   PLT 424.0 (H) 09/01/2022 1530   MCV 92.3 09/01/2022 1530   MCH  30.7 08/19/2022 2214   MCHC 33.8 09/01/2022 1530   RDW 13.5 09/01/2022 1530   LYMPHSABS 2.3 09/01/2022 1530   MONOABS 0.9 09/01/2022 1530   EOSABS 0.1 09/01/2022 1530   BASOSABS 0.1 09/01/2022 1530      Latest Ref Rng & Units 09/01/2022    3:30 PM 08/19/2022   10:14 PM 08/07/2022    5:12 PM  CMP  Glucose 70 - 99 mg/dL 94  604  540   BUN 6 - 23 mg/dL 11  9  9    Creatinine 0.40 - 1.20 mg/dL 9.81  1.91  4.78   Sodium 135 - 145 mEq/L 132  125  133   Potassium 3.5 - 5.1 mEq/L 4.3  3.4  3.7   Chloride 96 - 112 mEq/L 98  93  101   CO2 19 - 32 mEq/L 22  17  20    Calcium 8.4 - 10.5 mg/dL 29.5  9.7  9.8   Total Protein 6.0 - 8.3 g/dL 7.6     Total Bilirubin 0.2 - 1.2 mg/dL 0.6     Alkaline Phos 39 - 117 U/L 87     AST 0 - 37 U/L 20     ALT 0 - 35 U/L 17      TSH, parathyroid, B12, ANA, ANCA, anti-DNA, centromere ab, anti-smith, RF, SSA, SSB, Esr, CRP, iron studies, vitamin D wnl  IMAGING:  [facility]  08/07/22: unremarkable  Imaging independently reviewed on October 07, 2022   Current Outpatient Medications on File Prior to Visit  Medication Sig Dispense Refill   ALPRAZolam (XANAX) 0.25 MG tablet Take 1 tablet (0.25 mg total) by mouth 2 (two) times daily as needed for anxiety or sleep. 60 tablet 2   ascorbic acid (VITAMIN C) 500 MG tablet Take 500 mg by mouth daily.     bismuth subsalicylate (PEPTO BISMOL) 262 MG/15ML suspension Take 30 mLs by mouth at bedtime.     Cholecalciferol (VITAMIN D3 PO) Take 2,000 Units by mouth daily.     Coral Calcium 1000 (390 Ca) MG TABS Take 1,000 mg by mouth once.     losartan (COZAAR) 100 MG tablet Take 1 tablet (100 mg total) by mouth daily. 30 tablet 0   Menaquinone-7 (VITAMIN K2 PO) Take by mouth daily.     omeprazole (PRILOSEC) 20 MG capsule Take 1 capsule (20 mg total) by mouth daily. 30 capsule 6   No current facility-administered medications on file prior to visit.     Allergies: No Known Allergies  Family History: Family History  Problem  Relation Age of Onset   Stroke Mother 14       brain hemorrhage   Irritable bowel syndrome Mother    Hyperlipidemia Mother    COPD Father    Ovarian cancer Maternal Grandmother    Diabetes Paternal Grandmother    Heart disease Paternal Grandfather 52       MI   Hypertension Daughter    Hypertension Son    Autoimmune disease Neg Hx    Colon cancer Neg Hx    Rectal cancer Neg Hx    Stomach cancer Neg Hx    Esophageal cancer Neg Hx     Past Medical History: Past Medical History:  Diagnosis Date   Anxiety    Appendicitis 09/25/2013   Basal cell carcinoma    Blood in stool    Chicken pox    Dysphagia    GERD (gastroesophageal reflux disease)    High blood pressure    HLD (hyperlipidemia)    Mitral valve prolapse    Osteoporosis     Past Surgical History Past Surgical History:  Procedure Laterality Date   APPENDECTOMY  2015   Open appendectomy with right colectomy   BREAST BIOPSY Right 2001   benign   COLON SURGERY  2015   right hemicolectomy done at time of appendectomy   LAPAROSCOPIC APPENDECTOMY N/A 09/25/2013   Procedure:  LAPAROSCOPIC APPENDECTOMY CONVERTED TO LAPAROSCOPIC ASSISTED RIGHT COLECTOMY;  Surgeon: Atilano Ina, MD;  Location: [facility] OR;  Service: General;  Laterality: N/A;   TONSILLECTOMY      Social History: Social History   Tobacco Use   Smoking status: Never   Smokeless tobacco: Never  Vaping Use   Vaping status: Never Used  Substance Use Topics   Alcohol use: Yes    Comment: 1 glass of wine once a week   Drug use: No    ROS: Negative for fevers, chills. Positive for headaches, paresthesias. All other systems reviewed and negative unless stated otherwise in HPI.   Physical Exam:   Vital Signs: BP (!) 143/90 (BP Location: Right Arm, Patient Position: Sitting, Cuff Size: Normal)   Pulse 81   Ht 5\' 7"  (1.702 m)   Wt 131 lb 6.4 oz (59.6 kg)   BMI 20.58 kg/m  GENERAL: well appearing,in no acute distress,alert SKIN:  Color, texture,  turgor normal. No rashes or  lesions HEAD:  Normocephalic/atraumatic. CV:  RRR RESP: Normal respiratory effort MSK: +tenderness to palpation over bilateral occiput  NEUROLOGICAL: Mental Status: Alert, oriented to person, place and time,Follows commands Cranial Nerves: PERRL, visual fields intact to confrontation, extraocular movements intact, facial sensation intact, no facial droop or ptosis, hearing grossly intact, no dysarthria Motor: muscle strength 5/5 both upper and lower extremities Reflexes: 2+ throughout Sensation: intact to light touch all 4 extremities, decreased sensation to vibration in bilateral feet Coordination: Finger-to- nose-finger intact bilaterally Gait: normal-based   IMPRESSION: 67 year old female with a history of MVP, GERD, anxiety, s/p right colectomy who presents for evaluation of headaches and paresthesias. 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 Will order brain MRI to rule out underlying structural causes. MRI C-spine ordered for pain and paresthesias of her left upper extremity. Exam with decreased sensation to vibration in bilateral feet. Will check A1c levels and myeloma panel. May consider EMG if paresthesias persist without clear etiology. Will start Effexor for headaches, anxiety, and paresthesias.   PLAN: -A1c, myeloma panel -MRI brain, C-spine -Start Effexor 25 mg daily, uptitrate as needed   I spent a total of 41 minutes chart reviewing and counseling the patient. Headache education was done. Discussed treatment options including preventive and acute medications, and natural supplements. Discussed medication side effects, adverse reactions and drug interactions. Written educational materials and patient instructions outlining all of the above were given.  Follow-up: 6 months   Ocie Doyne, MD 10/07/2022   3:19 PM

## 2022-10-07 ENCOUNTER — Encounter: Payer: Self-pay | Admitting: Psychiatry

## 2022-10-07 ENCOUNTER — Telehealth: Payer: Self-pay | Admitting: Psychiatry

## 2022-10-07 ENCOUNTER — Ambulatory Visit: Payer: Medicare PPO | Admitting: Psychiatry

## 2022-10-07 VITALS — BP 143/90 | HR 81 | Ht 67.0 in | Wt 131.4 lb

## 2022-10-07 DIAGNOSIS — R200 Anesthesia of skin: Secondary | ICD-10-CM

## 2022-10-07 DIAGNOSIS — G629 Polyneuropathy, unspecified: Secondary | ICD-10-CM | POA: Diagnosis not present

## 2022-10-07 DIAGNOSIS — G4452 New daily persistent headache (NDPH): Secondary | ICD-10-CM | POA: Diagnosis not present

## 2022-10-07 MED ORDER — VENLAFAXINE HCL 25 MG PO TABS: 25.0000 mg | ORAL_TABLET | Freq: Every day | ORAL | 6 refills | Status: DC

## 2022-10-07 NOTE — Patient Instructions (Signed)
Supplements that can be tried for "nerve" type pain: 1. Alpha lipoic acid 600mg  daily: Has some research data but actual dose not well established as they used IV in the clinical research trials. No major side effects other than <1% of people report upset stomach. This can be taken twice per day (1200mg  daily) if no relief obtained.  2. Acetyl-L-carnitine 1000mg  3 times daily: this has the most research data backing it with reports of diabetic, HIV and chemotherapy related neuropathy patients reporting improved symptoms. Well tolerated overall but can cause GI upset so take it with food.  3. Fish Oil (750mg  eicosapentaenoic acid, 560mg  docosapentaenoic acid, 1020mg  docosahexaenoic acid) - animal models suggest it can improve neuropathy and potentially stimulate nerve growth. It has also been shown to be beneficial in humans with type I diabetes and neuropathy.  4. Lidocaine cream - 1-2% can be applied to the feet/symptomatic areas several times daily. Wear gloves!  5. Capsaicin cream: Made of chili peppers, this cream burns and is actually rather painful when you first put it on. Mechanism of action is that it overwhelms all of the pain fibers, which theoretically lessens the pain. Wear gloves!  8. Curcumin - up to 600mg  three times daily - somewhat expensive, but can be found on [place]. Most supplements offer ~1800mg  just to be taken daily. Animal models suggest it may help with neve pain and may also help with weight loss. There are no large data human trials to support the use of this.  9. Vitamin E 10mg  twice daily  10. Vick's vapor rub

## 2022-10-07 NOTE — Telephone Encounter (Signed)
Cohere Berkley Harvey: [id number] exp. 10/07/22-12/06/22 sent to GI [phone]

## 2022-10-08 ENCOUNTER — Encounter (INDEPENDENT_AMBULATORY_CARE_PROVIDER_SITE_OTHER): Payer: Self-pay

## 2022-10-09 LAB — METANEPHRINES, PLASMA: Normetanephrine, Free: 72 pg/mL (ref <=148)

## 2022-10-11 LAB — MULTIPLE MYELOMA PANEL, SERUM
Albumin SerPl Elph-Mcnc: 3.9 g/dL (ref 2.9–4.4)
Albumin/Glob SerPl: 1.4 (ref 0.7–1.7)
Alpha 1: 0.2 g/dL (ref 0.0–0.4)
Alpha2 Glob SerPl Elph-Mcnc: 0.7 g/dL (ref 0.4–1.0)
B-Globulin SerPl Elph-Mcnc: 1.1 g/dL (ref 0.7–1.3)
Gamma Glob SerPl Elph-Mcnc: 0.8 g/dL (ref 0.4–1.8)
Globulin, Total: 2.8 g/dL (ref 2.2–3.9)
IgA/Immunoglobulin A, Serum: 351 mg/dL (ref 87–352)
IgG (Immunoglobin G), Serum: 781 mg/dL (ref 586–1602)
IgM (Immunoglobulin M), Srm: 58 mg/dL (ref 26–217)
Total Protein: 6.7 g/dL (ref 6.0–8.5)

## 2022-10-11 LAB — HEMOGLOBIN A1C
Est. average glucose Bld gHb Est-mCnc: 126 mg/dL
Hgb A1c MFr Bld: 6 % — ABNORMAL HIGH (ref 4.8–5.6)

## 2022-10-13 ENCOUNTER — Ambulatory Visit: Payer: Medicare PPO | Admitting: Internal Medicine

## 2022-10-14 ENCOUNTER — Telehealth: Payer: Self-pay | Admitting: Cardiovascular Disease

## 2022-10-14 DIAGNOSIS — I10 Essential (primary) hypertension: Secondary | ICD-10-CM | POA: Diagnosis not present

## 2022-10-14 DIAGNOSIS — F411 Generalized anxiety disorder: Secondary | ICD-10-CM | POA: Diagnosis not present

## 2022-10-14 DIAGNOSIS — Z8249 Family history of ischemic heart disease and other diseases of the circulatory system: Secondary | ICD-10-CM | POA: Diagnosis not present

## 2022-10-14 DIAGNOSIS — L309 Dermatitis, unspecified: Secondary | ICD-10-CM | POA: Diagnosis not present

## 2022-10-14 DIAGNOSIS — M810 Age-related osteoporosis without current pathological fracture: Secondary | ICD-10-CM | POA: Diagnosis not present

## 2022-10-14 DIAGNOSIS — I7090 Unspecified atherosclerosis: Secondary | ICD-10-CM | POA: Diagnosis not present

## 2022-10-14 NOTE — Telephone Encounter (Signed)
Left voicemail to return call to office.

## 2022-10-14 NOTE — Telephone Encounter (Signed)
Patient's husband is calling in regards to the patient receiving the Heart Monitor. Patient's husband stated they would like to start the process for the heart monitor. Patient's husband stated the patient will have a MRI on 09/04 and was unsure if she would need to wait to wear the monitor after the MRI. Patient's husband stated we can call him or send a MyChart message. Please advise.

## 2022-10-15 NOTE — Telephone Encounter (Signed)
Returned husband's call in regard's to the monitor. Pt is having an MRI in September and they wanted to wait to start it after that. Let pt know that would be fine to do since the MRI is on the 6th.   Husband also asked about patient's A1C and if Dr. Flora Lipps had any suggestions on the number which he said is 6.0. Patient does eat a rather healthy diet and before this illness hit her she was walking a mile every night per husband. Advised pt this message will be forwarded to Dr. Flora Lipps and his nurse for recommendation. Husband states we can send a MyChart message.

## 2022-10-17 IMAGING — CT CT CHEST HIGH RESOLUTION
2 of 7 series · 14 of 36 positions shown, 17 images · non-contrast
Comparison: None Available.

CLINICAL DATA: Shortness of breath, abnormal chest radiograph



[Series 4: chest 2.00 br36 s3 cor soft · coronal · 0.62mm/px · 3 of 139 slices shown]
[im 28/139  lung]
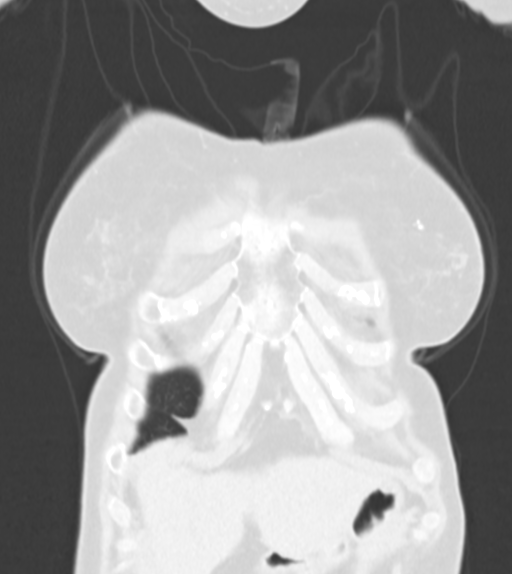
[im 56/139  lung]
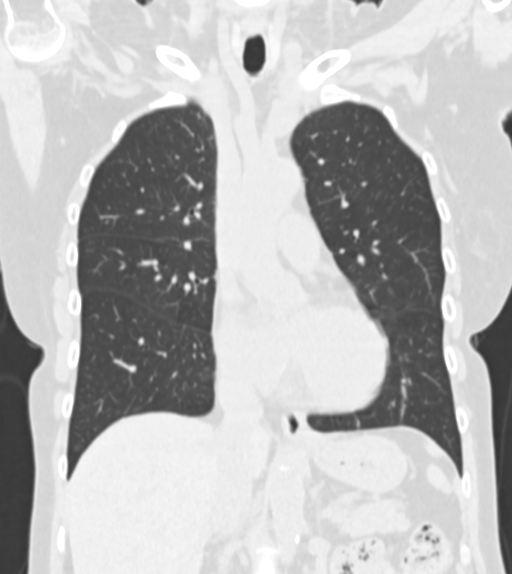
[im 83/139  lung]
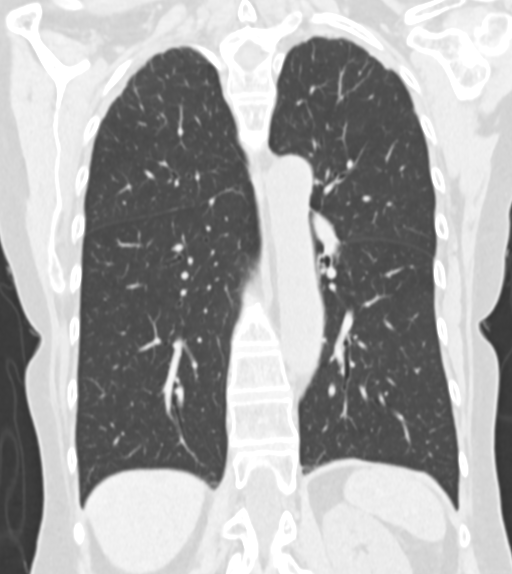

[Series 11: chest 1.00 br60 s3 high res thins 1x1 mm · axial · 0.56mm/px · z∈[+1547,+1849]mm · 11 of 351 slices shown, 14 images]
[im 27/351  mediastinal]
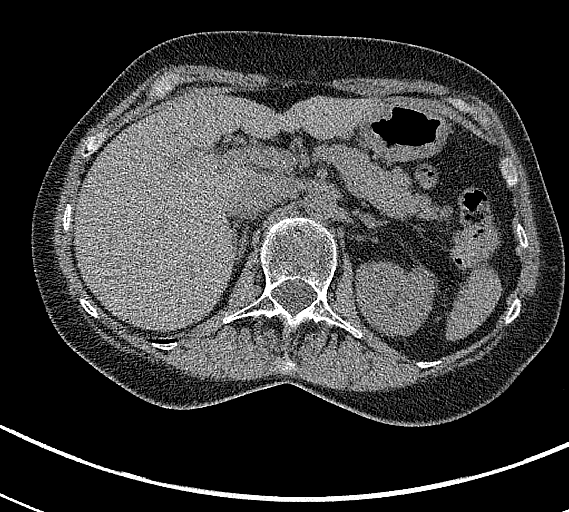
[im 27/351  lung]
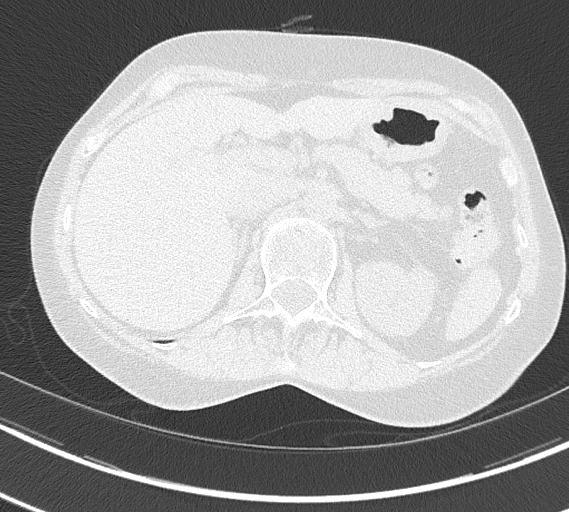
[im 54/351  lung]
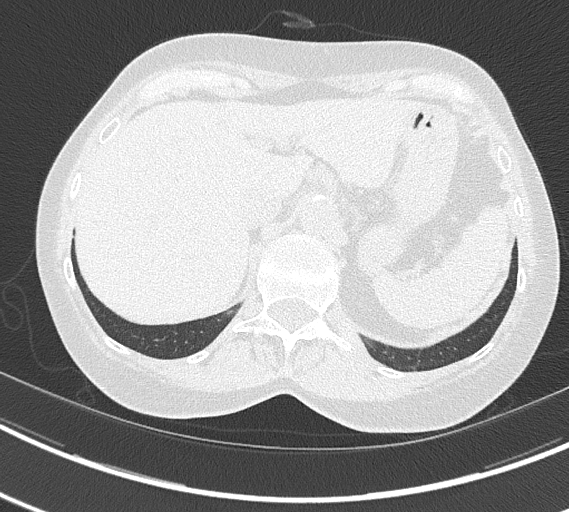
[im 81/351  lung]
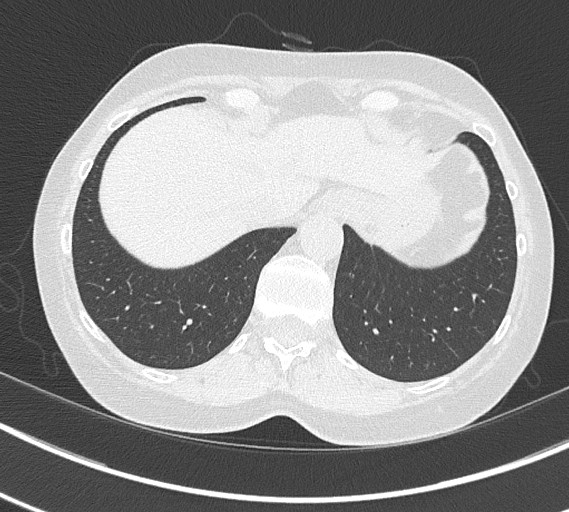
[im 108/351  lung]
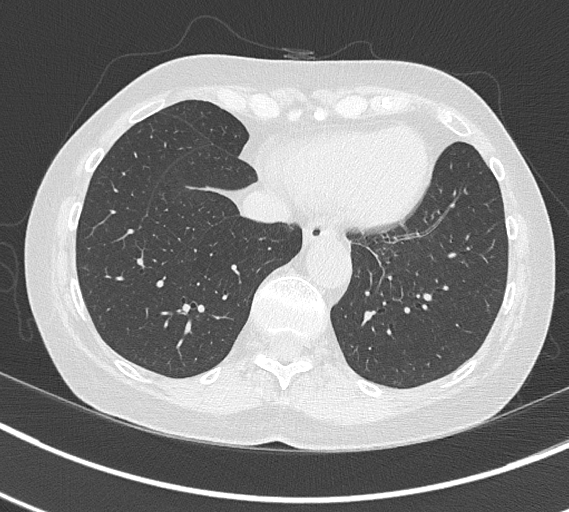
[im 135/351  mediastinal]
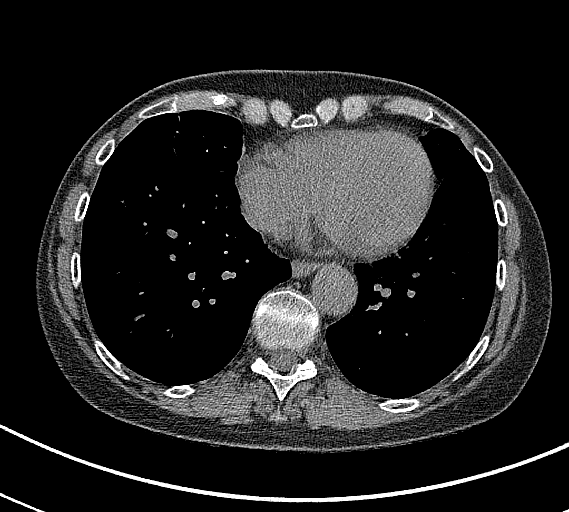
[im 135/351  lung]
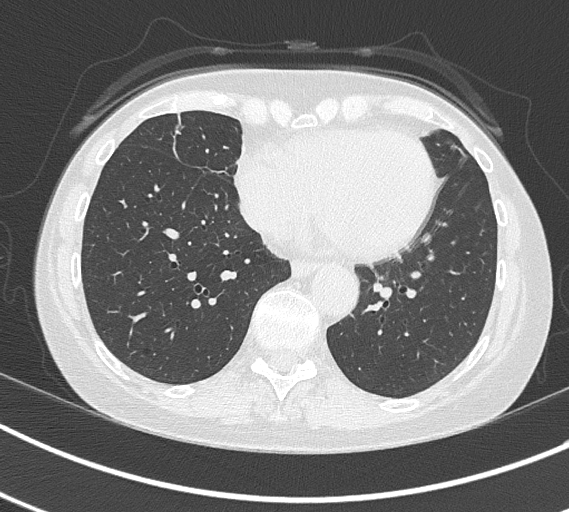
[im 189/351  lung]
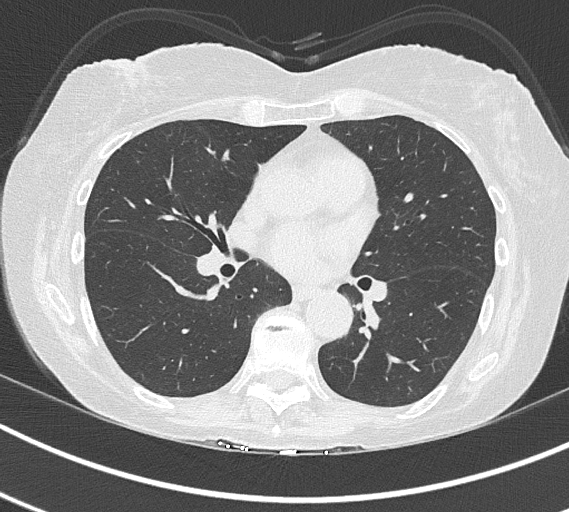
[im 216/351  lung]
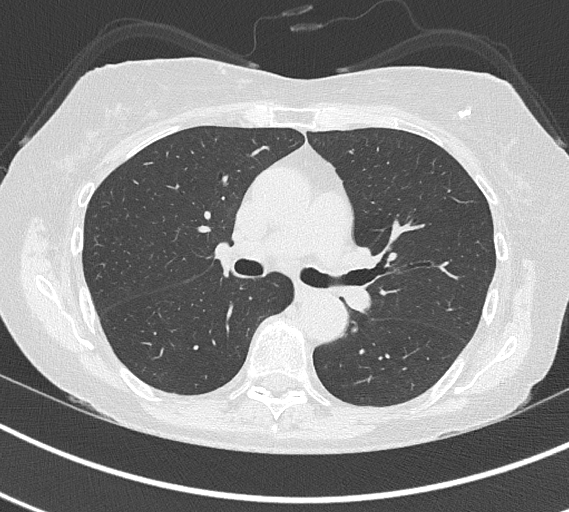
[im 243/351  lung]
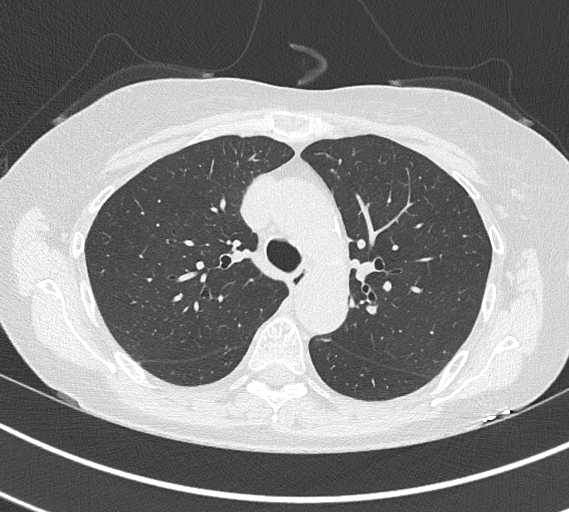
[im 270/351  mediastinal]
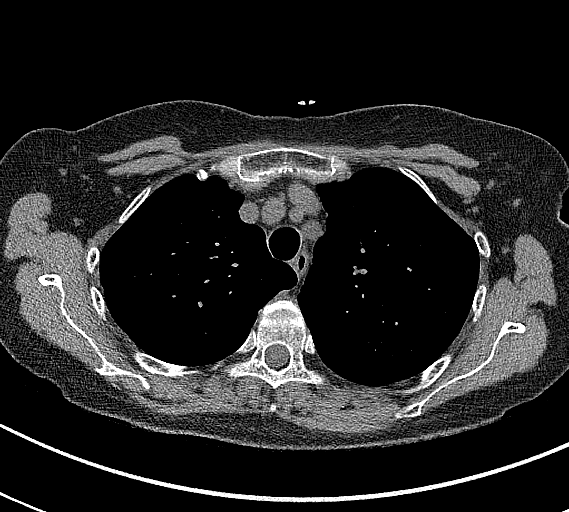
[im 270/351  lung]
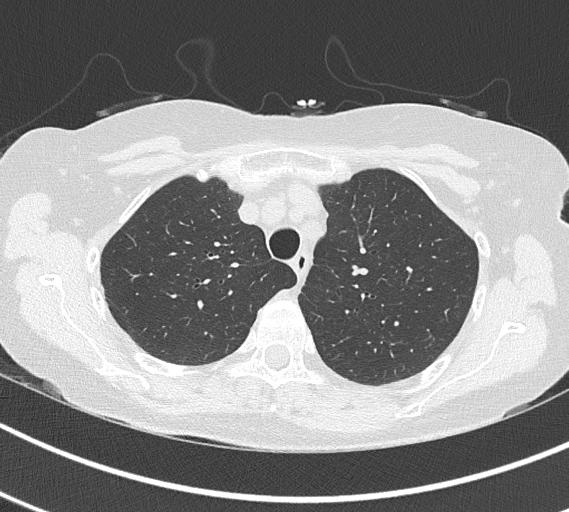
[im 297/351  lung]
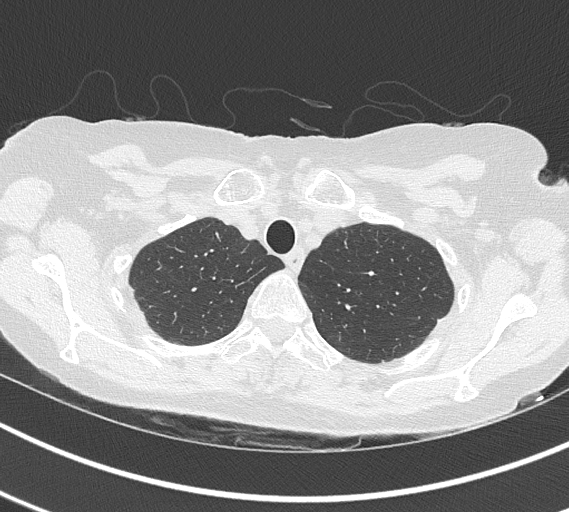
[im 324/351  lung]
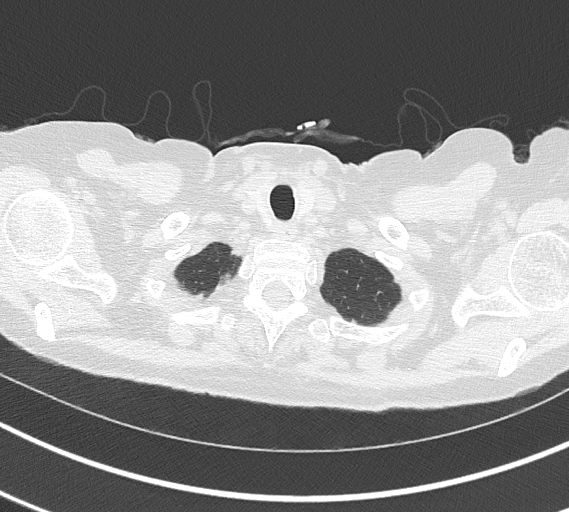

[14 of 36 positions shown; findings below may reference images not displayed]

FINDINGS: Cardiovascular: Aortic atherosclerosis. Normal heart size. No
pericardial effusion.

Mediastinum/Nodes: No enlarged mediastinal, hilar, or axillary lymph
nodes. Thyroid gland, trachea, and esophagus demonstrate no
significant findings.

Lungs/Pleura: Mild, bland, bandlike scarring of the bilateral lung
bases (series 8, image 109). No evidence of fibrotic interstitial
lung disease. Mild, lobular air trapping on expiratory phase
imaging. No pleural effusion or pneumothorax.

Upper Abdomen: No acute abnormality.

Musculoskeletal: No chest wall abnormality. No suspicious osseous
lesions identified.
IMPRESSION: 1. No evidence of fibrotic interstitial lung disease. Mild, bland,
post infectious or inflammatory scarring of the bilateral lung
bases.
2. Mild, lobular air trapping on expiratory phase imaging,
suggestive of small airways disease.

Aortic Atherosclerosis ([id number]).

## 2022-10-21 ENCOUNTER — Emergency Department (HOSPITAL_COMMUNITY): Payer: Medicare PPO

## 2022-10-21 ENCOUNTER — Emergency Department (HOSPITAL_COMMUNITY)
Admission: EM | Admit: 2022-10-21 | Discharge: 2022-10-21 | Disposition: A | Discharge status: Home / Self Care | Payer: Medicare PPO | Attending: Emergency Medicine | Admitting: Emergency Medicine

## 2022-10-21 ENCOUNTER — Encounter (HOSPITAL_COMMUNITY): Payer: Self-pay

## 2022-10-21 DIAGNOSIS — R112 Nausea with vomiting, unspecified: Secondary | ICD-10-CM | POA: Insufficient documentation

## 2022-10-21 DIAGNOSIS — Z79899 Other long term (current) drug therapy: Secondary | ICD-10-CM | POA: Diagnosis not present

## 2022-10-21 DIAGNOSIS — R519 Headache, unspecified: Secondary | ICD-10-CM | POA: Diagnosis not present

## 2022-10-21 DIAGNOSIS — M47812 Spondylosis without myelopathy or radiculopathy, cervical region: Secondary | ICD-10-CM | POA: Diagnosis not present

## 2022-10-21 DIAGNOSIS — R110 Nausea: Secondary | ICD-10-CM | POA: Diagnosis not present

## 2022-10-21 DIAGNOSIS — R42 Dizziness and giddiness: Secondary | ICD-10-CM | POA: Insufficient documentation

## 2022-10-21 DIAGNOSIS — I10 Essential (primary) hypertension: Secondary | ICD-10-CM | POA: Insufficient documentation

## 2022-10-21 LAB — URINALYSIS, ROUTINE W REFLEX MICROSCOPIC
Bilirubin Urine: NEGATIVE
Glucose, UA: NEGATIVE mg/dL
Hgb urine dipstick: NEGATIVE
Ketones, ur: 5 mg/dL — AB
Leukocytes,Ua: NEGATIVE
Nitrite: NEGATIVE
Protein, ur: NEGATIVE mg/dL
Specific Gravity, Urine: 1.008 (ref 1.005–1.030)
pH: 9 — ABNORMAL HIGH (ref 5.0–8.0)

## 2022-10-21 LAB — BASIC METABOLIC PANEL
Anion gap: 13 (ref 5–15)
BUN: 13 mg/dL (ref 8–23)
CO2: 21 mmol/L — ABNORMAL LOW (ref 22–32)
Calcium: 9.9 mg/dL (ref 8.9–10.3)
Chloride: 103 mmol/L (ref 98–111)
Creatinine, Ser: 1.02 mg/dL — ABNORMAL HIGH (ref 0.44–1.00)
GFR, Estimated: 60 mL/min — ABNORMAL LOW (ref >60)
Glucose, Bld: 167 mg/dL — ABNORMAL HIGH (ref 70–99)
Potassium: 3.6 mmol/L (ref 3.5–5.1)
Sodium: 137 mmol/L (ref 135–145)

## 2022-10-21 LAB — CBC
HCT: 36.5 % (ref 36.0–46.0)
Hemoglobin: 12.6 g/dL (ref 12.0–15.0)
MCH: 31.2 pg (ref 26.0–34.0)
MCHC: 34.5 g/dL (ref 30.0–36.0)
MCV: 90.3 fL (ref 80.0–100.0)
Platelets: 377 10*3/uL (ref 150–400)
RBC: 4.04 MIL/uL (ref 3.87–5.11)
RDW: 13 % (ref 11.5–15.5)
WBC: 10.7 10*3/uL — ABNORMAL HIGH (ref 4.0–10.5)
nRBC: 0 % (ref 0.0–0.2)

## 2022-10-21 LAB — CBG MONITORING, ED: Glucose-Capillary: 154 mg/dL — ABNORMAL HIGH (ref 70–99)

## 2022-10-21 MED ORDER — DIPHENHYDRAMINE HCL 50 MG/ML IJ SOLN
25.0000 mg | Freq: Once | INTRAMUSCULAR | Status: AC
  Administered 2022-10-21: 25 mg via INTRAVENOUS
  Filled 2022-10-21: qty 1

## 2022-10-21 MED ORDER — METOCLOPRAMIDE HCL 5 MG/ML IJ SOLN
10.0000 mg | Freq: Once | INTRAMUSCULAR | Status: AC
  Administered 2022-10-21: 10 mg via INTRAVENOUS
  Filled 2022-10-21: qty 2

## 2022-10-21 MED ORDER — DIPHENHYDRAMINE HCL 50 MG PO TABS: 50.0000 mg | ORAL_TABLET | Freq: Three times a day (TID) | ORAL | 0 refills | Status: DC | PRN

## 2022-10-21 MED ORDER — DIAZEPAM 5 MG/ML IJ SOLN
2.5000 mg | Freq: Once | INTRAMUSCULAR | Status: AC
  Administered 2022-10-21: 2.5 mg via INTRAVENOUS
  Filled 2022-10-21: qty 2

## 2022-10-21 MED ORDER — METOCLOPRAMIDE HCL 10 MG PO TABS: 10.0000 mg | ORAL_TABLET | Freq: Three times a day (TID) | ORAL | 0 refills | Status: DC | PRN

## 2022-10-21 MED ORDER — MECLIZINE HCL 25 MG PO TABS
25.0000 mg | ORAL_TABLET | Freq: Once | ORAL | Status: AC
  Administered 2022-10-21: 25 mg via ORAL
  Filled 2022-10-21: qty 1

## 2022-10-21 MED ORDER — ONDANSETRON HCL 4 MG/2ML IJ SOLN
4.0000 mg | Freq: Once | INTRAMUSCULAR | Status: AC
  Administered 2022-10-21: 4 mg via INTRAVENOUS
  Filled 2022-10-21: qty 2

## 2022-10-21 MED ORDER — SODIUM CHLORIDE 0.9 % IV BOLUS
1000.0000 mL | Freq: Once | INTRAVENOUS | Status: AC
  Administered 2022-10-21: 1000 mL via INTRAVENOUS

## 2022-10-21 NOTE — ED Notes (Addendum)
Pt and husband report that patient cannot swallow pills.  Not acute; has been ongoing for "years."  MD Denton Lank  and PA Ladona Ridgel notified and aware.

## 2022-10-21 NOTE — Discharge Instructions (Addendum)
As discussed, all of your labs and imaging show no acute findings. I have sent in prescriptions for Benadryl and Reglan to your pharmacy. You can take them both up to 8 hours a day for dizziness and nausea/vomiting. Additionally, I have sent a referral to ENT. They will call you in the next several days to schedule an appointment.  Get help right away if: You are always dizzy. You faint. You get very bad headaches. You get a stiff neck. Bright light starts to bother you. You have trouble moving or talking. You feel weak in your hands, arms, or legs. You have changes in your hearing or in how you see (vision).

## 2022-10-21 NOTE — ED Triage Notes (Addendum)
Patient arrives from homes with complaints of dizziness and headache (rated 2/10) that began at 2300 last night. Patient states she has also had nausea and vomiting x5 since last night. States the room is spinning and it gets worse when she is laying down. Also states she has been seen by a neurologist and is scheduled for an MRI. Denies falling. No blood thinners. No changes in speech or gate. No muscle weakness

## 2022-10-21 NOTE — ED Provider Notes (Signed)
[place] [facility] AT [facility] Provider Note   CSN: [id number] Arrival date & time: 10/21/22  2951     History  Chief Complaint  Patient presents with   Dizziness    Donna Hawkins is a 67 y.o. female with a history of mitral valve prolapse, hypertension, GERD, and anxiety who presents to the ED today for dizziness.  Patient reports that last night around 11 PM she started to develop dizziness that she describes as a "room spinning sensation," with associated nausea and vomiting.  Dizziness does not worsen with position.   Denies any head injury, falls, or blood thinner use.  Denies weakness, slurred speech, or gait abnormality. No hearing changes or recent illness.  Additionally, she reports a constant headache for the past 2 months. She has been seen in the ED twice for this and is also following with neurology about it. Patient is scheduled for a MRI next week.    Home Medications Prior to Admission medications   Medication Sig Start Date End Date Taking? Authorizing Provider  diphenhydrAMINE (BENADRYL) 50 MG tablet Take 1 tablet (50 mg total) by mouth every 8 (eight) hours as needed for itching. 10/21/22 11/20/22 Yes Maxwell Marion, PA-C  metoCLOPramide (REGLAN) 10 MG tablet Take 1 tablet (10 mg total) by mouth every 8 (eight) hours as needed for nausea. 10/21/22 11/20/22 Yes Maxwell Marion, PA-C  ALPRAZolam Prudy Feeler) 0.25 MG tablet Take 1 tablet (0.25 mg total) by mouth 2 (two) times daily as needed for anxiety or sleep. 09/22/22   Philip Aspen, Limmie Patricia, MD  ascorbic acid (VITAMIN C) 500 MG tablet Take 500 mg by mouth daily.    [provider]  bismuth subsalicylate (PEPTO BISMOL) 262 MG/15ML suspension Take 30 mLs by mouth at bedtime.    [provider]  Cholecalciferol (VITAMIN D3 PO) Take 2,000 Units by mouth daily.    [provider]  Coral Calcium 1000 (390 Ca) MG TABS Take 1,000 mg by mouth once. 08/24/18   [provider]  losartan (COZAAR) 100 MG tablet Take 1 tablet (100 mg total) by mouth daily. 08/20/22   Sabas Sous, MD  Menaquinone-7 (VITAMIN K2 PO) Take by mouth daily.    [provider]  omeprazole (PRILOSEC) 20 MG capsule Take 1 capsule (20 mg total) by mouth daily. 07/16/22   Meredith Pel, NP  venlafaxine (EFFEXOR) 25 MG tablet Take 1 tablet (25 mg total) by mouth daily. 10/07/22   Ocie Doyne, MD      Allergies    Patient has no known allergies.    Review of Systems   Review of Systems  Neurological:  Positive for dizziness.  All other systems reviewed and are negative.   Physical Exam Updated Vital Signs BP (!) 140/72   Pulse 73   Temp 98.1 F (36.7 C) (Oral)   Resp 18   Ht 5\' 7"  (1.702 m)   Wt 59 kg   SpO2 97%   BMI 20.36 kg/m  Physical Exam Vitals and nursing note reviewed.  Constitutional:      General: She is not in acute distress.    Appearance: Normal appearance.  HENT:     Head: Normocephalic and atraumatic.     Mouth/Throat:     Mouth: Mucous membranes are moist.  Eyes:     Extraocular Movements: Extraocular movements intact.     Conjunctiva/sclera: Conjunctivae normal.     Pupils: Pupils are equal, round, and reactive to light.  Cardiovascular:     Rate and Rhythm: Normal rate and regular rhythm.     Pulses: Normal pulses.     Heart sounds: Normal heart sounds.  Pulmonary:     Effort: Pulmonary effort is normal.     Breath sounds: Normal breath sounds.  Abdominal:     Palpations: Abdomen is soft.     Tenderness: There is no abdominal tenderness.  Musculoskeletal:     Cervical back: Normal range of motion.  Skin:    General: Skin is warm and dry.     Findings: No rash.  Neurological:     General: No focal deficit present.     Mental Status: She is alert.     Sensory: No sensory deficit.     Motor: No weakness.  Psychiatric:        Mood and Affect: Mood normal.        Behavior: Behavior normal.    ED Results /  Procedures / Treatments   Labs (all labs ordered are listed, but only abnormal results are displayed) Labs Reviewed  BASIC METABOLIC PANEL - Abnormal; Notable for the following components:      Result Value   CO2 21 (*)    Glucose, Bld 167 (*)    Creatinine, Ser 1.02 (*)    GFR, Estimated 60 (*)    All other components within normal limits  CBC - Abnormal; Notable for the following components:   WBC 10.7 (*)    All other components within normal limits  URINALYSIS, ROUTINE W REFLEX MICROSCOPIC - Abnormal; Notable for the following components:   APPearance HAZY (*)    pH 9.0 (*)    Ketones, ur 5 (*)    All other components within normal limits  CBG MONITORING, ED - Abnormal; Notable for the following components:   Glucose-Capillary 154 (*)    All other components within normal limits    EKG None  Radiology MR Cervical Spine Wo Contrast  Result Date: 10/21/2022 CLINICAL DATA:  Dizziness and headache. EXAM: MRI CERVICAL SPINE WITHOUT CONTRAST TECHNIQUE: Multiplanar, multisequence MR imaging of the cervical spine was performed. No intravenous contrast was administered. COMPARISON:  None Available. FINDINGS: Alignment: No substantial sagittal subluxation. Vertebrae: No fracture, evidence of discitis, or bone lesion. Cord: Normal cord signal. Posterior Fossa, vertebral arteries, paraspinal tissues: Visualized vertebral artery flow voids are maintained. Disc levels: Mild multilevel degenerative change. Small posterior disc osteophyte complex at C4-C5. Mild multilevel facet and uncovertebral hypertrophy. No significant foraminal or canal stenosis. IMPRESSION: Mild multilevel degenerative change without significant stenosis. Electronically Signed   By: Feliberto Harts M.D.   On: 10/21/2022 10:25   MR BRAIN WO CONTRAST  Result Date: 10/21/2022 CLINICAL DATA:  67 year old female with headache, dizziness, nausea. EXAM: MRI HEAD WITHOUT CONTRAST TECHNIQUE: Multiplanar, multiecho pulse  sequences of the brain and surrounding structures were obtained without intravenous contrast. COMPARISON:  Head CT 08/07/2022. FINDINGS: Brain: Cerebral volume is within normal limits for age. No restricted diffusion to suggest acute infarction. No midline shift, mass effect, evidence of mass lesion, ventriculomegaly, extra-axial collection or acute intracranial hemorrhage. Cervicomedullary junction and pituitary are within normal limits. Wallace Cullens and white matter signal is within normal limits for age throughout the brain. No cortical encephalomalacia. No chronic cerebral blood products on SWI. Deep gray nuclei, brainstem and cerebellum appear negative. Vascular: Major intracranial vascular flow voids are preserved, the distal left vertebral artery appears to be dominant, normal variant. Skull and upper cervical spine: Visualized bone marrow signal is  within normal limits. Cervical spine is detailed separately today. Sinuses/Orbits: Negative orbits. Paranasal sinuses and mastoids are stable and well aerated. Other: Grossly normal visible internal auditory structures. Stylomastoid foramina appear normal. Negative visible scalp and face. IMPRESSION: No acute intracranial abnormality. Normal for age noncontrast MRI appearance of the Brain. Electronically Signed   By: Odessa Fleming M.D.   On: 10/21/2022 10:25    Procedures Procedures: not indicated.   Medications Ordered in ED Medications  ondansetron (ZOFRAN) injection 4 mg (4 mg Intravenous Given 10/21/22 0722)  sodium chloride 0.9 % bolus 1,000 mL (0 mLs Intravenous Stopped 10/21/22 0820)  meclizine (ANTIVERT) tablet 25 mg (25 mg Oral Given 10/21/22 0748)  diazepam (VALIUM) injection 2.5 mg (2.5 mg Intravenous Given 10/21/22 1251)  diphenhydrAMINE (BENADRYL) injection 25 mg (25 mg Intravenous Given 10/21/22 1251)  metoCLOPramide (REGLAN) injection 10 mg (10 mg Intravenous Given 10/21/22 1251)    ED Course/ Medical Decision Making/ A&P                                  Medical Decision Making Amount and/or Complexity of Data Reviewed Labs: ordered. Radiology: ordered.  Risk OTC drugs. Prescription drug management.   This patient presents to the ED for concern of dizziness, this involves an extensive number of treatment options, and is a complaint that carries with it a high risk of complications and morbidity.   Differential diagnosis includes: electrolyte abnormality, dehydration, BPPV, Meniere's disease, labyrinthitis, etc.   Comorbidities  See HPI above   Additional History  Additional history obtained from previous ED and neurology notes.   Cardiac Monitoring / EKG  The patient was maintained on a cardiac monitor.  I personally viewed and interpreted the cardiac monitored which showed: sinus rhythm with a heart rate of 83 bpm.   Lab Tests  I ordered and personally interpreted labs.  The pertinent results include:   CBG of 154. BMP and CBC are within normal limits - no acute AKI, electrolyte abnormality, anemia, or infection. UA is within normal limits.   Imaging Studies  I ordered imaging studies including MRI head and neck  I independently visualized and interpreted imaging which showed: no acute intracranial abnormalities. I agree with the radiologist interpretation   Problem List / ED Course / Critical Interventions / Medication Management  Dizziness I ordered medications including: Normal saline, Meclizine, and Zofran for dizziness and nausea Reevaluation of the patient after these medicines showed that the patient stayed the same I then ordered IV Valium for patient's symptoms. On reevaluation, patient reports symptoms have remained the same. She states dizziness is worse with laying down. I ordered Reglan and Benadryl. This had some improvement on the patient's symptoms. I have reviewed the patients home medicines and have made adjustments as needed   Social Determinants of Health  Depression   Test /  Admission - Considered  Discussed results with patient and husband at bedside.  She is hemodynamically stable and safe for discharge home. Prescriptions sent to the pharmacy. Referral to ENT sent. Return precautions provided.       Final Clinical Impression(s) / ED Diagnoses Final diagnoses:  Dizziness    Rx / DC Orders ED Discharge Orders          Ordered    Ambulatory referral to ENT        10/21/22 1357    diphenhydrAMINE (BENADRYL) 50 MG tablet  Every 8 hours PRN  10/21/22 1357    metoCLOPramide (REGLAN) 10 MG tablet  Every 8 hours PRN        10/21/22 1357              Maxwell Marion, PA-C 10/21/22 1401    Cathren Laine, MD 10/21/22 2002

## 2022-10-21 NOTE — ED Notes (Signed)
Patient transported to MRI 

## 2022-10-21 NOTE — ED Notes (Signed)
Help wheel patient to the bathroom in a wheelchair patient did fine patient is now back in bed with family at bedside and bed rails up

## 2022-10-27 ENCOUNTER — Ambulatory Visit: Payer: Medicare PPO | Attending: Student

## 2022-10-27 DIAGNOSIS — R002 Palpitations: Secondary | ICD-10-CM

## 2022-10-27 NOTE — Progress Notes (Unsigned)
Enrolled for [employer] to mail a ZIO XT long term holter monitor to the patients address on file.   Dr. O'Neal to read.  

## 2022-10-28 ENCOUNTER — Encounter: Payer: Self-pay | Admitting: Psychiatry

## 2022-10-28 ENCOUNTER — Other Ambulatory Visit: Payer: Medicare PPO

## 2022-10-29 ENCOUNTER — Encounter: Payer: Medicare PPO | Admitting: Internal Medicine

## 2022-10-29 ENCOUNTER — Telehealth: Payer: Self-pay

## 2022-10-29 DIAGNOSIS — R002 Palpitations: Secondary | ICD-10-CM | POA: Diagnosis not present

## 2022-10-29 NOTE — Transitions of Care (Post Inpatient/ED Visit) (Addendum)
10/29/2022  Name: Donna Hawkins MRN: [id number] DOB: 1955-05-05  Today's TOC FU Call Status: Today's TOC FU Call Status:: Successful TOC FU Call Completed TOC FU Call Complete Date: 10/29/22 Patient's Name and Date of Birth confirmed.  Red on EMMI-ED Discharge Alert Date & Reason:10/23/22 "Scheduled follow-up appt? No"   Transition Care Management Follow-up Telephone Call Date of Discharge: 10/21/22 Discharge Facility: [facility]) Type of Discharge: Emergency Department Reason for ED Visit: Other: ("dizziness") How have you been since you were released from the hospital?: Same (Spouse states pt still having some dizziness but no vertigo,weak, occassional lower abd pain-worse with eating, decreased appetite, LBM Tues, occasional heart palpitiatons & bradycardia-awaiting heart montior delivery to wear/assess) Any questions or concerns?: No  Items Reviewed: Did you receive and understand the discharge instructions provided?: Yes Medications obtained,verified, and reconciled?: Yes (Medications Reviewed) Any new allergies since your discharge?: No (Spouse unsure if pt allergic to med but developed rash/itching from Reglan) Dietary orders reviewed?: Yes Type of Diet Ordered:: low salt/heart healthy Do you have support at home?: Yes People in Home: spouse Name of Support/Comfort Primary Source: Nadine Counts  Medications Reviewed Today: Medications Reviewed Today     Reviewed by Charlyn Minerva, RN (Registered Nurse) on 10/29/22 at [phone]  Med List Status: <None>   Medication Order Taking? Sig Documenting Provider Last Dose Status Informant  ALPRAZolam (XANAX) 0.25 MG tablet [id number] Yes Take 1 tablet (0.25 mg total) by mouth 2 (two) times daily as needed for anxiety or sleep. Philip Aspen, Limmie Patricia, MD Taking Active   ascorbic acid (VITAMIN C) 500 MG tablet [id number] Yes Take 500 mg by mouth daily. [provider] Taking Active   bismuth subsalicylate (PEPTO  BISMOL) 262 MG/15ML suspension [id number] Yes Take 30 mLs by mouth at bedtime. [provider] Taking Active   Cholecalciferol (VITAMIN D3 PO) [id number] Yes Take 2,000 Units by mouth daily. [provider] Taking Active   Coral Calcium 1000 (390 Ca) MG TABS [id number] Yes Take 1,000 mg by mouth once. [provider] Taking Active   dimenhyDRINATE (DRAMAMINE) 50 MG tablet [id number] Yes Take 50 mg by mouth daily. [provider] Taking Active Spouse/Significant Other  diphenhydrAMINE (BENADRYL) 50 MG tablet [id number] No Take 1 tablet (50 mg total) by mouth every 8 (eight) hours as needed for itching.  Patient not taking: Reported on 10/29/2022   Maxwell Marion, PA-C Not Taking Active   losartan (COZAAR) 100 MG tablet [id number] Yes Take 1 tablet (100 mg total) by mouth daily.  Patient taking differently: Take 100 mg by mouth daily. Spouse states taking 25mg    Sabas Sous, MD Taking Active Spouse/Significant Other  Menaquinone-7 (VITAMIN K2 PO) [id number] Yes Take by mouth daily. [provider] Taking Active   metoCLOPramide (REGLAN) 10 MG tablet [id number] No Take 1 tablet (10 mg total) by mouth every 8 (eight) hours as needed for nausea.  Patient not taking: Reported on 10/29/2022   Maxwell Marion, PA-C Not Taking Active Spouse/Significant Other  omeprazole (PRILOSEC) 20 MG capsule [id number] Yes Take 1 capsule (20 mg total) by mouth daily. Meredith Pel, NP Taking Active   venlafaxine [facility]) 25 MG tablet [id number] Yes Take 1 tablet (25 mg total) by mouth daily. Ocie Doyne, MD Taking Active             Home Care and Equipment/Supplies: Were Home Health Services Ordered?: NA Any new equipment or medical supplies ordered?: NA  Functional Questionnaire: Do you need assistance with  bathing/showering or dressing?: Yes Do you need assistance with meal preparation?: Yes Do you need assistance with eating?: No Do you have difficulty maintaining  continence: No Do you need assistance with getting out of bed/getting out of a chair/moving?: Yes Do you have difficulty managing or taking your medications?: Yes  Follow up appointments reviewed: PCP Follow-up appointment confirmed?: Yes Date of PCP follow-up appointment?: 10/30/22 (Spouse states that pt is transferring to new PCP-Dr. Casimiro Needle) Follow-up Provider: Dr. Acey Lav Follow-up appointment confirmed?: No (Disussed with spouse calling to make GI appt wtih provider due to pt having several GI issues-last seen GI MD in May-he will call and make an appt) Reason Specialist Follow-Up Not Confirmed: Patient has Specialist Provider Number and will Call for Appointment Do you need transportation to your follow-up appointment?: No Do you understand care options if your condition(s) worsen?: Yes-patient verbalized understanding  SDOH Interventions Today    Flowsheet Row Most Recent Value  SDOH Interventions   Food Insecurity Interventions Intervention Not Indicated  Transportation Interventions Intervention Not Indicated      TOC Interventions Today    Flowsheet Row Most Recent Value  TOC Interventions   TOC Interventions Discussed/Reviewed TOC Interventions Discussed      Interventions Today    Flowsheet Row Most Recent Value  Chronic Disease   Chronic disease during today's visit Hypertension (HTN)  General Interventions   General Interventions Discussed/Reviewed General Interventions Discussed, Doctor Visits, Durable Medical Equipment (DME)  [spouse monitoring BP in the home-last two readings 109/67, 128/7, pt with some bradycardia events-cardiology aware-has shipped out heart monitor for pt to wear for 14 days-sposue still awaiting to receive monitor]  Doctor Visits Discussed/Reviewed Doctor Visits Discussed, PCP, Specialist  Durable Medical Equipment (DME) BP Cuff  PCP/Specialist Visits Compliance with follow-up visit  Education Interventions   Education  Provided Provided Education  Provided Verbal Education On Nutrition, When to see the doctor, Medication, Other  [sx mgmt]  Nutrition Interventions   Nutrition Discussed/Reviewed Nutrition Discussed, Fluid intake  [decreased appetite-pt eating small servings of solid food, no recent N&, had some diarrhea a few days ago-resolved]  Pharmacy Interventions   Pharmacy Dicussed/Reviewed Pharmacy Topics Discussed, Medications and their functions  Safety Interventions   Safety Discussed/Reviewed Safety Discussed, Home Safety, Fall Risk  [Spouse voices pt is weak and deconditioned-he discussed with her about getting HH therapy but pt was not interested at this time, fall/safety measures reviewed]  Home Safety Assistive Devices       Antionette Fairy, [facility] Care Management Care Management Community Coordinator Direct Phone: [phone] Toll Free: [phone] Fax: [phone]

## 2022-10-30 ENCOUNTER — Encounter: Payer: Self-pay | Admitting: Family Medicine

## 2022-10-30 ENCOUNTER — Ambulatory Visit: Payer: Medicare PPO | Admitting: Family Medicine

## 2022-10-30 VITALS — BP 140/90 | HR 94 | Temp 97.6°F | Ht 67.0 in | Wt 128.2 lb

## 2022-10-30 DIAGNOSIS — G44209 Tension-type headache, unspecified, not intractable: Secondary | ICD-10-CM

## 2022-10-30 DIAGNOSIS — R110 Nausea: Secondary | ICD-10-CM | POA: Diagnosis not present

## 2022-10-30 DIAGNOSIS — G4700 Insomnia, unspecified: Secondary | ICD-10-CM

## 2022-10-30 DIAGNOSIS — I10 Essential (primary) hypertension: Secondary | ICD-10-CM | POA: Diagnosis not present

## 2022-10-30 MED ORDER — LOSARTAN POTASSIUM 25 MG PO TABS: 25.0000 mg | ORAL_TABLET | Freq: Every day | ORAL | Status: DC

## 2022-10-30 MED ORDER — ONDANSETRON HCL 4 MG PO TABS: 4.0000 mg | ORAL_TABLET | Freq: Three times a day (TID) | ORAL | 2 refills | Status: DC | PRN

## 2022-10-30 MED ORDER — VENLAFAXINE HCL 25 MG PO TABS
25.0000 mg | ORAL_TABLET | Freq: Two times a day (BID) | ORAL | Status: DC
Start: 2022-10-30 — End: 2022-12-30

## 2022-10-30 MED ORDER — AMITRIPTYLINE HCL 10 MG PO TABS
10.0000 mg | ORAL_TABLET | Freq: Every day | ORAL | 2 refills | Status: DC
Start: 2022-10-30 — End: 2022-12-22

## 2022-10-30 NOTE — Assessment & Plan Note (Signed)
Down to 25 mg of losartan, has been checking her blood pressure daily at home, reports a history of white coat HTN, will continue her 25 mg daily.

## 2022-10-30 NOTE — Progress Notes (Unsigned)
Established Patient Office Visit  Subjective   Patient ID: Donna Hawkins, female    DOB: 12/12/1955  Age: 67 y.o. MRN: [id number]  Chief Complaint  Patient presents with   Establish Care    Pt states that she has been having multiple issues since about June 8 states that it started with a mild headache. She thought initially that she was having a little viral illness. However she began to have multiple new symptoms, extreme fatigue, tiredness and decreased appetite, mild weight loss. She has seen mulitple specialists including endocrinology, neurology, and cardiology.   States that when she first got the symptoms the first one was diarrhea, states she would have one episode of diarrhea once a week, states that she did try the sertraline however got extreme diarrhea with this so she stopped it. She has some abdominal discomfort.   I spent 60 minutes with the patient today reviewing her symptoms, specialist notes, labs, imaging studies, etc. Counseled patient on possible next steps and what medications could be used to help alleviate her symptoms.   Current Outpatient Medications  Medication Instructions   ALPRAZolam (XANAX) 0.25 mg, Oral, 2 times daily PRN   amitriptyline (ELAVIL) 10 mg, Oral, Daily at bedtime   ascorbic acid (VITAMIN C) 500 mg, Oral, Daily   bismuth subsalicylate (PEPTO BISMOL) 262 MG/15ML suspension 30 mLs, Oral, Daily at bedtime   Cholecalciferol (VITAMIN D3 PO) 2,000 Units, Oral, Daily   Coral Calcium 1,000 mg, Oral,  Once   dimenhyDRINATE (DRAMAMINE) 50 mg, Oral, Daily   diphenhydrAMINE (BENADRYL) 50 mg, Oral, Every 8 hours PRN   losartan (COZAAR) 25 mg, Oral, Daily   Menaquinone-7 (VITAMIN K2 PO) Oral, Daily   ondansetron (ZOFRAN-ODT) 4 mg, Oral, Every 8 hours PRN   venlafaxine (EFFEXOR) 25 mg, Oral, 2 times daily    Patient Active Problem List   Diagnosis Date Noted   Subclinical hypothyroidism 09/01/2022   HTN (hypertension) 08/14/2022   Shortness  of breath 08/21/2021   Atherosclerosis 08/21/2021   Age-related osteoporosis without current pathological fracture 02/28/2020   Tachycardia 02/28/2020   Anxiety 02/28/2020   Mitral valve prolapse 05/04/2019   Encounter for other preprocedural examination 04/03/2019   S/P right colectomy 09/26/2013      Review of Systems  Constitutional:  Positive for malaise/fatigue and weight loss. Negative for chills, diaphoresis and fever.  Gastrointestinal:  Positive for diarrhea and nausea. Negative for blood in stool.  Psychiatric/Behavioral:  The patient is nervous/anxious.       Objective:     BP (!) 140/90 (BP Location: Right Arm, Patient Position: Sitting, Cuff Size: Normal)   Pulse 94   Temp 97.6 F (36.4 C) (Oral)   Ht 5\' 7"  (1.702 m)   Wt 128 lb 3.2 oz (58.2 kg)   SpO2 95%   BMI 20.08 kg/m    Physical Exam Vitals reviewed.  Constitutional:      Appearance: Normal appearance. She is normal weight.  Eyes:     Conjunctiva/sclera: Conjunctivae normal.  Cardiovascular:     Rate and Rhythm: Normal rate and regular rhythm.     Pulses: Normal pulses.     Heart sounds: Normal heart sounds. No murmur heard. Pulmonary:     Effort: Pulmonary effort is normal.     Breath sounds: Normal breath sounds. No wheezing.  Abdominal:     General: Bowel sounds are normal. There is no distension.     Palpations: There is no mass.  Lymphadenopathy:  Cervical: No cervical adenopathy.  Neurological:     Mental Status: She is alert and oriented to person, place, and time. Mental status is at baseline.  Psychiatric:        Mood and Affect: Mood normal.        Behavior: Behavior normal.      No results found for any visits on 10/30/22.    The 10-year ASCVD risk score (Arnett DK, et al., 2019) is: 11.6%    Assessment & Plan:  Primary hypertension Assessment & Plan: Down to 25 mg of losartan, has been checking her blood pressure daily at home, reports a history of white coat HTN,  will continue her 25 mg daily.   Orders: -     Losartan Potassium; Take 1 tablet (25 mg total) by mouth daily.  Insomnia, unspecified type We discussed various medication that could help with her insomnia, this might also improve her fatigue levels during the days as well. We discussed amitriptyline and she is agreeable.  -     Amitriptyline HCl; Take 1 tablet (10 mg total) by mouth at bedtime.  Dispense: 30 tablet; Refill: 2  Nausea Will give pt rx for ondansetron for the nausea to provide some relief. We briefly discussed ordering GB work up such as HIDA scan. Pt will think about it  -     Ondansetron HCl; Take 1 tablet (4 mg total) by mouth every 8 (eight) hours as needed for nausea or vomiting.  Dispense: 30 tablet; Refill: 2  Tension headache This was prescribed for her by the neurologist but she has not started taking the medication yet. I advised that she should start the medication to see if it would help with the chronic headaches.   -     Venlafaxine HCl; Take 1 tablet (25 mg total) by mouth 2 (two) times daily.     Return in about 2 months (around 12/30/2022).    Karie Georges, MD

## 2022-11-01 ENCOUNTER — Encounter: Payer: Self-pay | Admitting: Family Medicine

## 2022-11-01 DIAGNOSIS — R110 Nausea: Secondary | ICD-10-CM

## 2022-11-02 MED ORDER — ONDANSETRON 4 MG PO TBDP
4.0000 mg | ORAL_TABLET | Freq: Three times a day (TID) | ORAL | 1 refills | Status: DC | PRN
Start: 2022-11-02 — End: 2022-12-30

## 2022-11-05 ENCOUNTER — Encounter: Payer: Self-pay | Admitting: Family Medicine

## 2022-11-05 DIAGNOSIS — R1084 Generalized abdominal pain: Secondary | ICD-10-CM

## 2022-11-05 DIAGNOSIS — R110 Nausea: Secondary | ICD-10-CM

## 2022-11-06 ENCOUNTER — Other Ambulatory Visit: Payer: Self-pay | Admitting: Family Medicine

## 2022-11-12 DIAGNOSIS — R002 Palpitations: Secondary | ICD-10-CM | POA: Diagnosis not present

## 2022-11-13 ENCOUNTER — Ambulatory Visit (HOSPITAL_COMMUNITY)
Admission: RE | Admit: 2022-11-13 | Discharge: 2022-11-13 | Disposition: A | Discharge status: Home / Self Care | Payer: Medicare PPO | Source: Ambulatory Visit | Attending: Family Medicine | Admitting: Family Medicine

## 2022-11-13 DIAGNOSIS — R1084 Generalized abdominal pain: Secondary | ICD-10-CM | POA: Insufficient documentation

## 2022-11-13 DIAGNOSIS — R110 Nausea: Secondary | ICD-10-CM | POA: Diagnosis not present

## 2022-11-13 DIAGNOSIS — R1011 Right upper quadrant pain: Secondary | ICD-10-CM | POA: Diagnosis not present

## 2022-11-13 MED ORDER — TECHNETIUM TC 99M MEBROFENIN IV KIT
5.0000 | PACK | Freq: Once | INTRAVENOUS | Status: AC | PRN
  Administered 2022-11-13: 5 via INTRAVENOUS

## 2022-11-15 ENCOUNTER — Encounter: Payer: Self-pay | Admitting: Family Medicine

## 2022-11-15 DIAGNOSIS — K828 Other specified diseases of gallbladder: Secondary | ICD-10-CM

## 2022-11-16 ENCOUNTER — Telehealth: Payer: Self-pay | Admitting: Family Medicine

## 2022-11-16 NOTE — Telephone Encounter (Signed)
Patients husband called and would like a return call regarding referral to surgeon for patient.  Can be reached at [phone].

## 2022-11-17 ENCOUNTER — Telehealth: Payer: Self-pay | Admitting: Student

## 2022-11-17 DIAGNOSIS — K828 Other specified diseases of gallbladder: Secondary | ICD-10-CM | POA: Diagnosis not present

## 2022-11-17 NOTE — Telephone Encounter (Signed)
Pt's husband calling to f/u on Heart Monitor results. He states that pt is still have issues to where it's hard for her to sleep at night. They would like a callback regarding this matter. Please advise

## 2022-11-17 NOTE — Telephone Encounter (Signed)
Informed Mr. Sawin that the results from the monitor have not be reviewed yet. Asked pt to call back if he has not received an update in mychart by Friday 9/27.

## 2022-11-20 ENCOUNTER — Telehealth: Payer: Self-pay | Admitting: Student

## 2022-11-20 MED ORDER — PROPRANOLOL HCL 10 MG PO TABS: 10.0000 mg | ORAL_TABLET | Freq: Two times a day (BID) | ORAL | 1 refills | Status: DC | PRN

## 2022-11-20 NOTE — Telephone Encounter (Signed)
Corresponded with patient through MyChart. Reviewed monitor results. Would like to try patient on propranolol. She is concerned about her heart rate dropping into the 50s on occasion. Her average heart rate on monitor is 75 bpm. Patient agreed to propranolol as needed for sustained palpitations. Will have scheduling contact her to follow up with Dr. Carmon Ginsberg.   Etta Grandchild. Adriann Thau, DNP, NP-C  11/20/2022, 4:49 PM [facility] [address] Office [phone] Fax [phone]

## 2022-11-21 ENCOUNTER — Other Ambulatory Visit: Payer: Self-pay | Admitting: Family Medicine

## 2022-11-21 DIAGNOSIS — G4700 Insomnia, unspecified: Secondary | ICD-10-CM

## 2022-11-23 ENCOUNTER — Other Ambulatory Visit: Payer: Self-pay | Admitting: Internal Medicine

## 2022-11-23 NOTE — Telephone Encounter (Signed)
Called pt to schedule follow up appt with Oneal. Lm for cb

## 2022-11-24 NOTE — Telephone Encounter (Signed)
Rx done. 

## 2022-11-24 NOTE — Telephone Encounter (Signed)
Ok to refill 

## 2022-11-25 NOTE — Telephone Encounter (Signed)
Spoke with patient, it was about his cholesterol-- we discussed this and I placed order for CT calcium score. Ok to close task

## 2022-11-25 NOTE — Telephone Encounter (Signed)
Noted  

## 2022-12-07 ENCOUNTER — Ambulatory Visit (INDEPENDENT_AMBULATORY_CARE_PROVIDER_SITE_OTHER): Payer: Medicare PPO | Admitting: Audiology

## 2022-12-07 ENCOUNTER — Ambulatory Visit (INDEPENDENT_AMBULATORY_CARE_PROVIDER_SITE_OTHER): Payer: Medicare PPO

## 2022-12-07 ENCOUNTER — Encounter (INDEPENDENT_AMBULATORY_CARE_PROVIDER_SITE_OTHER): Payer: Self-pay

## 2022-12-07 VITALS — Ht 67.0 in | Wt 128.0 lb

## 2022-12-07 DIAGNOSIS — H906 Mixed conductive and sensorineural hearing loss, bilateral: Secondary | ICD-10-CM | POA: Diagnosis not present

## 2022-12-07 DIAGNOSIS — H903 Sensorineural hearing loss, bilateral: Secondary | ICD-10-CM

## 2022-12-07 DIAGNOSIS — R42 Dizziness and giddiness: Secondary | ICD-10-CM

## 2022-12-07 NOTE — Progress Notes (Signed)
[facility] [address], [address] [place], [place] [address]  Audiological Evaluation   Lateasha Breuer was referred today for a hearing evaluation by Dr. Allena Katz.   History: Symptoms Yes/No Details  Hearing Loss No Patient denied perceiving any hearing loss.  Tinnitus Yes Patient reported intermittent high-pitched tinnitus, bilaterally.  Balance Problems Yes Patient reported an episode of spinning vertigo and current imbalance after that episode.  Family History No Patient denied a family history of hearing loss.  Amplification No Patient denied the use of hearing aids.    Tympanogram: Right ear: Normal external ear canal volume with normal middle ear pressure and tympanic membrane compliance (Type A). Left ear: Normal external ear canal volume with normal middle ear pressure and tympanic membrane compliance (Type A).  Hearing Evaluation: The audiogram was completed using conventional audiometric techniques under headphones with good reliability.   The hearing test results indicate: Right ear: Normal hearing sensitivity from [phone] Hz sloping to moderately-severe sensorineural hearing loss from 3000-8000 Hz. Left ear: Normal hearing sensitivity from [phone] Hz sloping to moderately-severe sensorineural hearing loss from 6000-8000 Hz.   Speech Recognition Thresholds were obtained at 20 dBHL masked in the right ear and 15 dBHL in the left ear.   Word Recognition Testing was completed using the NU-6 word lists at 60 dBHL with 30 dBHL of masking noise in the right ear and at 55 dBHL in the left ear and the patient scored 92% in the right ear and 96% in the left ear.    Recommendations: Repeat audiogram when changes are perceived or per MD. Consider various tinnitus strategies, including the use of a noise generator, hearing aids, or tinnitus retraining therapy.   Conley Rolls Kalea Perine, AUD, CCC-A 12/07/22

## 2022-12-07 NOTE — Progress Notes (Unsigned)
Dear Dr. Philip Aspen, Here is my assessment for our mutual patient, Donna Hawkins. Thank you for allowing me the opportunity to care for your patient. Please do not hesitate to contact me should you have any other questions. Sincerely, Dr. Jovita Kussmaul  Otolaryngology Clinic Note Referring provider: Dr. Philip Aspen HPI:  Donna Hawkins is a 67 y.o. female kindly referred by Dr. Philip Aspen for evaluation of vertigo. PMHx: MVP, HTN, GERD, Anxiety.   Seen in ED in August 2024 for dizziness - "room spinnning sensation" w/ N/V.  This lasted for about 12 hours with resolution and now only feels a little lightheaded.  Has never had these episodes before or since.  Did not worsen with position. No antecedent event such as URI or sickness. Denies any head injury, falls, weakness, slurred speech, or gait abnormality. No hearing changes during the episode or ear pressure or fullness.  Does have some bilateral roaring?  Tinnitus.  Unclear if this is pulsatile and she is unable to accurately describe the tinnitus but reports most time it is high-pitched.  She also additionally denied any auras.  Of note, she does report some history of recent unilateral right-sided headaches for which she had been seen in the ED including in June 2024 and has undergone a CT head and MRI brain.  She does follow neurology for migraines and reports that her headaches have improved a little bit on medication.  She is also having some other symptoms such as palpitations, parasthesias in her extremities, and some diarrhea and fatigue.  She otherwise denies autophony, sound or pressure induced vertigo, ear drainage, pain, pressure, popping, deep pain within the ear canal.  She also denies history of barotrauma, vestibular suppressant use (has not had to use meclizine), or prior ear surgery.  No significant tinnitus causing medications noted.  PMH/Meds/All/SocHx/FamHx/ROS:   Past Medical History:  Diagnosis Date    Anxiety    Appendicitis 09/25/2013   Basal cell carcinoma    Blood in stool    Chicken pox    Dysphagia    GERD (gastroesophageal reflux disease)    High blood pressure    HLD (hyperlipidemia)    Mitral valve prolapse    Osteoporosis    Past Surgical History:  Procedure Laterality Date   APPENDECTOMY  2015   Open appendectomy with right colectomy   BREAST BIOPSY Right 2001   benign   COLON SURGERY  2015   right hemicolectomy done at time of appendectomy   LAPAROSCOPIC APPENDECTOMY N/A 09/25/2013   Procedure:  LAPAROSCOPIC APPENDECTOMY CONVERTED TO LAPAROSCOPIC ASSISTED RIGHT COLECTOMY;  Surgeon: Atilano Ina, MD;  Location: [facility] OR;  Service: General;  Laterality: N/A;   TONSILLECTOMY      Family History  Problem Relation Age of Onset   Stroke Mother 70       brain hemorrhage   Irritable bowel syndrome Mother    Hyperlipidemia Mother    COPD Father    Ovarian cancer Maternal Grandmother    Diabetes Paternal Grandmother    Heart disease Paternal Grandfather 17       MI   Hypertension Daughter    Hypertension Son    Autoimmune disease Neg Hx    Colon cancer Neg Hx    Rectal cancer Neg Hx    Stomach cancer Neg Hx    Esophageal cancer Neg Hx    No family history of bleeding disorders or difficulty with anesthesia.  Social Connections: Moderately Isolated (08/13/2022)   Social Connection and  Isolation Panel [NHANES]    Frequency of Communication with Friends and Family: Once a week    Frequency of Social Gatherings with Friends and Family: More than three times a week    Attends Religious Services: Never    Database administrator or Organizations: No    Attends Engineer, structural: Not on file    Marital Status: Married     Current Outpatient Medications:    ALPRAZolam (XANAX) 0.25 MG tablet, Take 1 tablet (0.25 mg total) by mouth 2 (two) times daily as needed for anxiety or sleep., Disp: 60 tablet, Rfl: 2   ascorbic acid (VITAMIN C) 500 MG tablet, Take 500  mg by mouth daily., Disp: , Rfl:    bismuth subsalicylate (PEPTO BISMOL) 262 MG/15ML suspension, Take 30 mLs by mouth at bedtime., Disp: , Rfl:    Cholecalciferol (VITAMIN D3 PO), Take 2,000 Units by mouth daily., Disp: , Rfl:    Coral Calcium 1000 (390 Ca) MG TABS, Take 1,000 mg by mouth once., Disp: , Rfl:    famotidine (PEPCID) 20 MG tablet, Take 20 mg by mouth 2 (two) times daily., Disp: , Rfl:    losartan (COZAAR) 25 MG tablet, Take 1 tablet (25 mg total) by mouth daily., Disp: , Rfl:    Menaquinone-7 (VITAMIN K2 PO), Take by mouth daily., Disp: , Rfl:    ondansetron (ZOFRAN-ODT) 4 MG disintegrating tablet, Take 1 tablet (4 mg total) by mouth every 8 (eight) hours as needed for nausea or vomiting., Disp: 30 tablet, Rfl: 1   propranolol (INDERAL) 10 MG tablet, Take 1 tablet (10 mg total) by mouth 2 (two) times daily as needed. Take 1 tablet by mouth for sustained palpitations up to 2 times a day., Disp: 60 tablet, Rfl: 1   amitriptyline (ELAVIL) 10 MG tablet, Take 1 tablet (10 mg total) by mouth at bedtime. (Patient not taking: Reported on 12/07/2022), Disp: 30 tablet, Rfl: 2   dimenhyDRINATE (DRAMAMINE) 50 MG tablet, Take 50 mg by mouth daily. (Patient not taking: Reported on 12/07/2022), Disp: , Rfl:    diphenhydrAMINE (BENADRYL) 50 MG tablet, Take 1 tablet (50 mg total) by mouth every 8 (eight) hours as needed for itching., Disp: 90 tablet, Rfl: 0   losartan (COZAAR) 50 MG tablet, TAKE 1 TABLET BY MOUTH EVERY DAY, Disp: 90 tablet, Rfl: 1   venlafaxine (EFFEXOR) 25 MG tablet, Take 1 tablet (25 mg total) by mouth 2 (two) times daily. (Patient not taking: Reported on 12/07/2022), Disp: , Rfl:   Physical Exam:   Ht 5\' 7"  (1.702 m)   Wt 128 lb (58.1 kg)   BMI 20.05 kg/m   Salient findings:  CN II-XII intact Bilateral EAC clear and TM intact with well pneumatized middle ear spaces.  No vertigo with tragal pump or pneumatic otoscopy Weber 512: Midline Rinne 512: AC > BC b/l Rine 1024: AC >  BC b/l  No gross nystagmus on EOMI or gaze evoked nystagmus. Head shake negative Romberg negative Anterior rhinoscopy: Septum relatively midline; bilateral inferior turbinates without significant hypertrophy No lesions of oral cavity/oropharynx; dentition  No obviously palpable neck masses/lymphadenopathy/thyromegaly No respiratory distress or stridor  Independent Review of Additional Tests or Records:   CT Head 08/07/2022 independently reviewed with focus on temporal bones showing: well aerated mastoids, no ME effusion; unable to definitely report SCC bony covering but no other obvious large lesion in ME or dehiscent carotid or pathology  MRI Brain 10/21/22 independently reviewed: no obvious schwannoma or IAC  lesion noted or mastoid effusion; semi circular canals appear appropriate on T2   12/09/2022 Audiogram was independently reviewed and interpreted by me and it reveals Right ear: normal downsloping to moderate HL - query small conductive component in higher freq but symmetric it appears; 92% word interpretation at 60dB; type A tympanogram Left ear: see above, symmetric with right; 96% word interpretation at 55dB; type A tympanogram  SNHL= Sensorineural hearing loss     Procedures:  None  Impression & Plans:  Javonda Suh is a 67 y.o. female with: Episodic vertigo -Unclear what this could be.  Given the duration of symptoms, query Mnire's versus atypical migraine.  Her CT and MRI do not appear to show any significant retrocochlear pathology although MRI is not ideal without contrast and not IAC.  - Audiogram ordered today and reviewed today -Discussed options, which include empiric treatment versus further evaluation with MRI versus formal vestibular testing.  I do suspect that given the lightheadedness, she may have atypical migraine. -She was to forego formal vestibular testing and will contact me when her MRI brain with contrast is done with neurology for me to  review. -Should she have any further episodes especially if it includes hearing loss, we will reevaluate and send her for formal vestibular testing Lightheadedness -Do not think this is related to a otologic etiology, will defer to cardiac versus neurologic etiology and further workup per them Mild-moderate mixed HL -Discussed repeat audiogram but will defer  MDM:  Level 4: 99204 Complexity/Problems addressed: mod Data complexity: mod - Morbidity: mod    Thank you for allowing me the opportunity to care for your patient. Please do not hesitate to contact me should you have any other questions.  Sincerely, Jovita Kussmaul, MD Otolarynoglogist (ENT), [facility] Phone: [phone] Fax: [phone]  12/07/2022, 8:45 AM

## 2022-12-15 ENCOUNTER — Other Ambulatory Visit: Payer: Self-pay | Admitting: Student

## 2022-12-16 ENCOUNTER — Other Ambulatory Visit: Payer: Self-pay | Admitting: Family Medicine

## 2022-12-16 DIAGNOSIS — C44311 Basal cell carcinoma of skin of nose: Secondary | ICD-10-CM

## 2022-12-22 ENCOUNTER — Other Ambulatory Visit: Payer: Self-pay | Admitting: Family Medicine

## 2022-12-22 DIAGNOSIS — G4700 Insomnia, unspecified: Secondary | ICD-10-CM

## 2022-12-22 MED ORDER — AMITRIPTYLINE HCL 10 MG PO TABS
10.0000 mg | ORAL_TABLET | Freq: Every day | ORAL | 1 refills | Status: DC
Start: 2022-12-22 — End: 2022-12-30

## 2022-12-30 ENCOUNTER — Encounter: Payer: Self-pay | Admitting: Family Medicine

## 2022-12-30 ENCOUNTER — Ambulatory Visit: Payer: Medicare PPO | Admitting: Family Medicine

## 2022-12-30 VITALS — BP 108/82 | HR 62 | Temp 97.6°F | Ht 67.0 in | Wt 129.1 lb

## 2022-12-30 DIAGNOSIS — R6883 Chills (without fever): Secondary | ICD-10-CM | POA: Diagnosis not present

## 2022-12-30 DIAGNOSIS — G4700 Insomnia, unspecified: Secondary | ICD-10-CM | POA: Diagnosis not present

## 2022-12-30 LAB — SEDIMENTATION RATE: Sed Rate: 21 mm/h (ref 0–30)

## 2022-12-30 LAB — CBC
HCT: 38.9 % (ref 36.0–46.0)
Hemoglobin: 12.6 g/dL (ref 12.0–15.0)
MCHC: 32.3 g/dL (ref 30.0–36.0)
MCV: 95.4 fL (ref 78.0–100.0)
Platelets: 410 10*3/uL — ABNORMAL HIGH (ref 150.0–400.0)
RBC: 4.08 Mil/uL (ref 3.87–5.11)
RDW: 13.3 % (ref 11.5–15.5)
WBC: 8.5 10*3/uL (ref 4.0–10.5)

## 2022-12-30 LAB — C-REACTIVE PROTEIN: CRP: <1 mg/dL (ref 0.5–20.0)

## 2022-12-30 MED ORDER — ALPRAZOLAM 0.25 MG PO TABS
0.2500 mg | ORAL_TABLET | Freq: Every evening | ORAL | Status: DC | PRN
Start: 2022-12-30 — End: 2023-05-04

## 2022-12-30 NOTE — Assessment & Plan Note (Signed)
Pt is down to once a day on the alprazolam, will continue this daily, I have changed it in the med list. I encouraged her to maybe try the amitriptyline if she continues to have problems with sleep., we again discussed the risks of long term benzodiazepine use.

## 2022-12-30 NOTE — Progress Notes (Signed)
Established Patient Office Visit  Subjective   Patient ID: Donna Hawkins, female    DOB: 04/10/1955  Age: 67 y.o. MRN: [id number]  Chief Complaint  Patient presents with   Medical Management of Chronic Issues    Pt is here for follow up today. She reports she is slightly better, however she still has the constant headache and feels "chills" up and down her body at night. She did meet with the surgeon about her gallbladder and she said it might help with some of the GI symptoms but it certainly would not help with the other symptoms.   She continues to report the chronic right sided headache and the excessive fatigue. Her weight is stable now and she reports her appetite is stable. She did relay that she had a history of dental infection during this time of illness and had a root canal and eventually had to have her tooth pulled, this happened about a week ago. She reports she was treated with antibiotics as well by her dentist. States she just saw him in follow up. States that the knot she had on the right lower jaw is smaller but still somewhat tender. We discussed that this could have possibly caused the right sided headache and some of her symptoms.   I reviewed the results of her heart monitor. She had 11 episodes of SVT during the period. She was placed on 10 mg BID propranolol to help control it. She continues to report feeling that her heart is "pounding" in her chest, and that she will just be sitting there and her heart rate will increase "for no reason".  Anxiety/ Insomnia-- pt continues to take the xanax at night to help her fall asleep. States she never tried the amitriptyline. States that she was nervous about starting a new medication after she had a bad reaction to the effexor. We discussed that this medication is still a viable option for her to try if she is still having difficulty sleeping. I have changes her alprazolam rx to reflect that she is only taking it once a  day.    Current Outpatient Medications  Medication Instructions   ALPRAZolam (XANAX) 0.25 mg, Oral, At bedtime PRN   ascorbic acid (VITAMIN C) 500 mg, Oral, Daily   bismuth subsalicylate (PEPTO BISMOL) 262 MG/15ML suspension 30 mLs, Oral, Daily at bedtime   Cholecalciferol (VITAMIN D3 PO) 2,000 Units, Oral, Daily   Coral Calcium 1,000 mg, Oral,  Once   famotidine (PEPCID) 20 mg, Oral, 2 times daily   losartan (COZAAR) 25 mg, Oral, Daily   Menaquinone-7 (VITAMIN K2 PO) Oral, Daily   propranolol (INDERAL) 10 MG tablet TAKE 1 TABLET (10 MG TOTAL) BY MOUTH 2 (TWO) TIMES DAILY AS NEEDED. FOR SUSTAINED PALPITATIONS    Patient Active Problem List   Diagnosis Date Noted   Insomnia 12/30/2022   Subclinical hypothyroidism 09/01/2022   HTN (hypertension) 08/14/2022   Shortness of breath 08/21/2021   Atherosclerosis 08/21/2021   Age-related osteoporosis without current pathological fracture 02/28/2020   Tachycardia 02/28/2020   Anxiety 02/28/2020   Mitral valve prolapse 05/04/2019   Encounter for other preprocedural examination 04/03/2019   S/P right colectomy 09/26/2013      Review of Systems  All other systems reviewed and are negative.     Objective:     BP 108/82 (BP Location: Left Arm, Patient Position: Sitting, Cuff Size: Normal)   Pulse 62   Temp 97.6 F (36.4 C) (Oral)   Ht 5'  7" (1.702 m)   Wt 129 lb 1.6 oz (58.6 kg)   SpO2 98%   BMI 20.22 kg/m    Physical Exam Vitals reviewed.  Constitutional:      Appearance: Normal appearance. She is normal weight.  HENT:     Mouth/Throat:     Dentition: No gingival swelling, dental abscesses or gum lesions.     Tongue: No lesions.  Cardiovascular:     Rate and Rhythm: Normal rate and regular rhythm.     Pulses: Normal pulses.     Heart sounds: Normal heart sounds. No murmur heard. Pulmonary:     Effort: Pulmonary effort is normal.     Breath sounds: Normal breath sounds. No wheezing.  Lymphadenopathy:     Cervical:  No cervical adenopathy.  Neurological:     Mental Status: She is alert and oriented to person, place, and time. Mental status is at baseline.  Psychiatric:        Mood and Affect: Mood normal.        Behavior: Behavior normal.      The 10-year ASCVD risk score (Arnett DK, et al., 2019) is: 7.1%    Assessment & Plan:  Chills without fever Pt mentions recent dental infection/abscess with root canal/ tooth extraction. This could explain the right sided headache she has been experiencing and the slight elevation in white count she had in August. Will order new CBC, ESR and CRP to ensure resolution  -     CBC -     C-reactive protein -     Sedimentation rate  Insomnia, unspecified type Assessment & Plan: Pt is down to once a day on the alprazolam, will continue this daily, I have changed it in the med list. I encouraged her to maybe try the amitriptyline if she continues to have problems with sleep., we again discussed the risks of long term benzodiazepine use.   Orders: -     ALPRAZolam; Take 1 tablet (0.25 mg total) by mouth at bedtime as needed for anxiety or sleep.  I spent 30 minutes with the patient reviewing ENT notes, discussing her symptoms and offering verbal support. Also discussed the biliary dyskinesia and I read the specialist note for this as well.    Return in about 3 months (around 04/01/2023) for follow up -- will need lipid panelthis visit.Karie Georges, MD

## 2023-01-05 DIAGNOSIS — H43813 Vitreous degeneration, bilateral: Secondary | ICD-10-CM | POA: Diagnosis not present

## 2023-01-05 DIAGNOSIS — H2513 Age-related nuclear cataract, bilateral: Secondary | ICD-10-CM | POA: Diagnosis not present

## 2023-01-17 NOTE — Progress Notes (Unsigned)
Cardiology Office Note:  .   Date:  01/18/2023  ID:  Donna Hawkins, DOB 1955-10-27, MRN [id number] PCP: Karie Georges, MD  [facility] Providers Cardiologist:  Reatha Harps, MD { History of Present Illness: .   Donna Hawkins is a 67 y.o. female with history of MVP, HTN, SVT who presents for follow-up.   History of Present Illness   Donna Hawkins, a 67 year old with a history of mitral valve prolapse and SVT, presents with a six-month history of general malaise. She initially experienced a headache, queasiness, heart palpitations, and chills without fever. These symptoms have persisted, with the headache being slight but constant. She also reports a general feeling of unwellness and occasional chills.  In addition to these symptoms, she has been experiencing gastrointestinal issues. She initially lost her appetite and experienced stomach pain, leading to a weight loss of 10 pounds. Although her appetite has returned, she continues to experience stomach discomfort and occasional queasiness. She reports that her stomach pain is not typical of gallbladder pain but is more generalized across her abdomen.  She also reports episodes of heart palpitations and a sensation of her heart "flip-flopping." These episodes occur both during activity and at rest. She has noticed her heart rate fluctuating, sometimes dropping to as low as 50 beats per minute and at other times increasing to 80 or 90 beats per minute. These fluctuations can last for several hours. Also has chest pressure, not associated with movement or improved with cessation of activity.   She has been using a KardiaMobile device to monitor her heart rate and rhythm at home. The device has recorded episodes of premature ventricular contractions (PVCs). She reports feeling stressed due to her ongoing health issues and has been experiencing insomnia.          Problem List 1. HTN 2. Mitral valve prolapse -mild  AMVL, trivial 2023 3. SVT -brief episodes, 11 episodes in 7 days, longest duration 9.6 secs     ROS: All other ROS reviewed and negative. Pertinent positives noted in the HPI.     Studies Reviewed: Marland Kitchen       Physical Exam:   VS:  BP 128/88 (BP Location: Left Arm, Patient Position: Sitting, Cuff Size: Normal)   Pulse 70   Ht 5\' 7"  (1.702 m)   Wt 130 lb (59 kg)   SpO2 99%   BMI 20.36 kg/m    Wt Readings from Last 3 Encounters:  01/18/23 130 lb (59 kg)  12/30/22 129 lb 1.6 oz (58.6 kg)  12/07/22 128 lb (58.1 kg)    GEN: Well nourished, well developed in no acute distress NECK: No JVD; No carotid bruits CARDIAC: RRR, no murmurs, rubs, gallops RESPIRATORY:  Clear to auscultation without rales, wheezing or rhonchi  ABDOMEN: Soft, non-tender, non-distended EXTREMITIES:  No edema; No deformity  ASSESSMENT AND PLAN: .   Assessment and Plan    Supraventricular Tachycardia (SVT) and Premature Ventricular Contractions (PVCs) Patient reports palpitations and variable heart rate. Recent monitor showed benign PVCs. No evidence of dangerous arrhythmias. -Discontinue Propranolol. -Start Metoprolol Tartrate 25mg  BID.  Chest Pain and Abdominal Symptoms Patient reports chest heaviness, throat tightness, and abdominal discomfort. Gallbladder ejection fraction is reduced (15%). Patient is scheduled for gallbladder surgery. Chest pain is suspected to be non-cardiac, but coronary CTA will be performed to exclude CAD. -Order coronary CTA. -Stop Metoprolol Tartrate and take 50mg  two hours before the scan. -Continue with gallbladder surgery as planned.  Follow-up: Return in about 1 year (around 01/18/2024).  Time Spent with Patient: I have spent a total of 35 minutes caring for this patient today face to face, ordering and reviewing labs/tests, reviewing prior records/medical history, examining the patient, establishing an assessment and plan, communicating results/findings to the  patient/family, and documenting in the medical record.   Signed, Lenna Gilford. Flora Lipps, MD, [facility]  [facility]  [address] [place], [place] [address] [phone]  3:36 PM

## 2023-01-18 ENCOUNTER — Encounter: Payer: Self-pay | Admitting: Cardiovascular Disease

## 2023-01-18 ENCOUNTER — Ambulatory Visit: Payer: Medicare PPO | Attending: Cardiovascular Disease | Admitting: Cardiovascular Disease

## 2023-01-18 VITALS — BP 128/88 | HR 70 | Ht 67.0 in | Wt 130.0 lb

## 2023-01-18 DIAGNOSIS — R072 Precordial pain: Secondary | ICD-10-CM

## 2023-01-18 DIAGNOSIS — Z01812 Encounter for preprocedural laboratory examination: Secondary | ICD-10-CM

## 2023-01-18 DIAGNOSIS — I341 Nonrheumatic mitral (valve) prolapse: Secondary | ICD-10-CM

## 2023-01-18 DIAGNOSIS — I471 Supraventricular tachycardia, unspecified: Secondary | ICD-10-CM | POA: Diagnosis not present

## 2023-01-18 DIAGNOSIS — R002 Palpitations: Secondary | ICD-10-CM

## 2023-01-18 MED ORDER — METOPROLOL TARTRATE 50 MG PO TABS: ORAL_TABLET | ORAL | 0 refills | Status: DC

## 2023-01-18 MED ORDER — METOPROLOL TARTRATE 25 MG PO TABS: 25.0000 mg | ORAL_TABLET | Freq: Two times a day (BID) | ORAL | 3 refills | Status: DC

## 2023-01-18 NOTE — Patient Instructions (Addendum)
Medication Instructions:  - START METOPROLOL TARTRATE 25MG  TWICE A  DAY - STOP PROPANOLOL    *If you need a refill on your cardiac medications before your next appointment, please call your pharmacy*   Lab Work: BMET    If you have labs (blood work) drawn today and your tests are completely normal, you will receive your results only by: MyChart Message (if you have MyChart) OR A paper copy in the mail If you have any lab test that is abnormal or we need to change your treatment, we will call you to review the results.   Testing/Procedures:   Your cardiac CT will be scheduled at one of the below locations:   [facility] [address] [place], [place] [address] [phone]  OR  [facility] [address] [place], [place] [address] [phone]  OR   [facility] [address] [place], [place] [address] [phone]  If scheduled at [facility], please arrive at the [facility] and Children's Entrance (Entrance C2) of [facility] 30 minutes prior to test start time. You can use the FREE valet parking offered at entrance C (encouraged to control the heart rate for the test)  Proceed to the [facility] Radiology Department (first floor) to check-in and test prep.  All radiology patients and guests should use entrance C2 at [facility], accessed from [facility], even though the hospital's physical address listed is [address]    If scheduled at [facility] or [facility], please arrive 15 mins early for check-in and test prep.  There is spacious parking and easy access to the radiology department from the [facility] Heart and Vascular entrance. Please enter here and check-in with the desk attendant.   Please follow these instructions carefully (unless otherwise directed):  An IV will be required  for this test and Nitroglycerin will be given.  Hold all erectile dysfunction medications at least 3 days (72 hrs) prior to test. (Ie viagra, cialis, sildenafil, tadalafil, etc)   On the Night Before the Test: Be sure to Drink plenty of water. Do not consume any caffeinated/decaffeinated beverages or chocolate 12 hours prior to your test. Do not take any antihistamines 12 hours prior to your test.  On the Day of the Test: Drink plenty of water until 1 hour prior to the test. Do not eat any food 1 hour prior to test. You may take your regular medications prior to the test.  Take metoprolol (Lopressor) two hours prior to test. If you take Furosemide/Hydrochlorothiazide/Spironolactone, please HOLD on the morning of the test. FEMALES- please wear underwire-free bra if available, avoid dresses & tight clothing       After the Test: Drink plenty of water. After receiving IV contrast, you may experience a mild flushed feeling. This is normal. On occasion, you may experience a mild rash up to 24 hours after the test. This is not dangerous. If this occurs, you can take Benadryl 25 mg and increase your fluid intake. If you experience trouble breathing, this can be serious. If it is severe call 911 IMMEDIATELY. If it is mild, please call our office. If you take any of these medications: Glipizide/Metformin, Avandament, Glucavance, please do not take 48 hours after completing test unless otherwise instructed.  We will call to schedule your test 2-4 weeks out understanding that some insurance companies will need an authorization prior to the  service being performed.   For more information and frequently asked questions, please visit our website : [email or web address]  For non-scheduling related questions, please contact the cardiac imaging nurse navigator should you have any questions/concerns: Cardiac Imaging Nurse Navigators Direct Office Dial: [phone]   For scheduling needs,  including cancellations and rescheduling, please call [place], [phone].    Follow-Up: At [facility], you and your health needs are our priority.  As part of our continuing mission to provide you with exceptional heart care, we have created designated Provider Care Teams.  These Care Teams include your primary Cardiologist (physician) and Advanced Practice Providers (APPs -  Physician Assistants and Nurse Practitioners) who all work together to provide you with the care you need, when you need it.  We recommend signing up for the patient portal called "MyChart".  Sign up information is provided on this After Visit Summary.  MyChart is used to connect with patients for Virtual Visits (Telemedicine).  Patients are able to view lab/test results, encounter notes, upcoming appointments, etc.  Non-urgent messages can be sent to your provider as well.   To learn more about what you can do with MyChart, go to [email or web address].    Your next appointment:   1 year(s)  The format for your next appointment:   In Person  Provider:   Edd Fabian, FNP, Micah Flesher, PA-C, Marjie Skiff, PA-C, Robet Leu, PA-C, Juanda Crumble, PA-C, Joni Reining, DNP, ANP, Azalee Course, PA-C, Bernadene Person, NP, or Reather Littler, NP       Other Instructions

## 2023-01-19 ENCOUNTER — Ambulatory Visit: Payer: Medicare PPO | Admitting: Nurse Practitioner

## 2023-01-19 ENCOUNTER — Encounter: Payer: Self-pay | Admitting: Nurse Practitioner

## 2023-01-19 ENCOUNTER — Other Ambulatory Visit: Payer: Self-pay

## 2023-01-19 VITALS — BP 110/80 | HR 68 | Ht 67.0 in | Wt 129.2 lb

## 2023-01-19 DIAGNOSIS — Z860101 Personal history of adenomatous and serrated colon polyps: Secondary | ICD-10-CM

## 2023-01-19 DIAGNOSIS — K2100 Gastro-esophageal reflux disease with esophagitis, without bleeding: Secondary | ICD-10-CM | POA: Diagnosis not present

## 2023-01-19 DIAGNOSIS — R6881 Early satiety: Secondary | ICD-10-CM

## 2023-01-19 DIAGNOSIS — K828 Other specified diseases of gallbladder: Secondary | ICD-10-CM | POA: Diagnosis not present

## 2023-01-19 DIAGNOSIS — R110 Nausea: Secondary | ICD-10-CM | POA: Diagnosis not present

## 2023-01-19 LAB — BASIC METABOLIC PANEL
BUN/Creatinine Ratio: 18 (ref 12–28)
BUN: 14 mg/dL (ref 8–27)
CO2: 21 mmol/L (ref 20–29)
Calcium: 10 mg/dL (ref 8.7–10.3)
Chloride: 101 mmol/L (ref 96–106)
Creatinine, Ser: 0.8 mg/dL (ref 0.57–1.00)
Glucose: 89 mg/dL (ref 70–99)
Potassium: 4.3 mmol/L (ref 3.5–5.2)
Sodium: 139 mmol/L (ref 134–144)
eGFR: 80 mL/min/{1.73_m2} (ref >59)

## 2023-01-19 MED ORDER — PROPRANOLOL HCL 20 MG PO TABS: 20.0000 mg | ORAL_TABLET | Freq: Two times a day (BID) | ORAL | 3 refills | Status: DC

## 2023-01-19 NOTE — Patient Instructions (Addendum)
Contact our office if you wish to schedule a gastric emptying study.  Due to recent changes in healthcare laws, you may see the results of your imaging and laboratory studies on MyChart before your provider has had a chance to review them.  We understand that in some cases there may be results that are confusing or concerning to you. Not all laboratory results come back in the same time frame and the provider may be waiting for multiple results in order to interpret others.  Please give [place] 48 hours in order for your provider to thoroughly review all the results before contacting the office for clarification of your results.   Thank you for trusting me with your gastrointestinal care!   Alcide Evener, CRNP

## 2023-01-19 NOTE — Progress Notes (Signed)
Propanolol updated to 20mg  BID and sent to pharmacy on [address]. Patient notified

## 2023-01-19 NOTE — Progress Notes (Signed)
01/19/2023 Haedyn Hassel [id number] 20-Jun-1955   Chief Complaint: Nausea, abdominal pain   History of Present Illness: Alfonsina Cintora. Burrola is a 67 year old female with a past medical history of anxiety, hypertension, hyperlipidemia, MVP, osteoporosis, and GERD. Acute appendicitis s/p appendectomy and right colon resection 2015.  She has chronic nausea and upper abdominal pain, previously followed by Dr. Orvan Falconer then pain management was transitioned to Dr. Myrtie Neither.  She previously underwent EGD 05/25/2022 which showed grade a reflux esophagitis and mild reactive gastropathy without evidence of H. pylori. She was last seen in office by Willette Cluster, NP 07/16/2022 with chronic epigastric pain.  She underwent RUQ sonogram 08/31/2022 which showed a normal gallbladder without CBD dilatation.  She underwent a CCK HIDA scan 11/13/2022 which showed a reduced gallbladder ejection fracture of 15% was consistent with gallbladder dyskinesia.  She was seen by surgeon Dr. Sophronia Simas 11/17/2022 for possible cholecystectomy.  Dr. Freida Busman assessed that the patient's epigastric/RUQ pains were likely due to biliary dyskinesia and that her nausea and decreased appetite may not be related to gallbladder dysfunction and may not be relieved with a cholecystectomy. The patient remains undecided about proceeding with a cholecystectomy or not and presents today to further discuss her symptoms.  She has a decreased appetite and endorses losing 8 to 10 pounds June through July 2024 but over the past few months has gained back the lost weight.  She continues to have intermittent nausea and describes feeling stomach fullness a few hours after eating.  No vomiting.  She sometimes has pain above the umbilicus. She previously had spells of diarrhea once weekly few months ago which abated.  She is passing a normal brown formed bowel movement most days.  No rectal bleeding or black stools.  She underwent a colonoscopy 04/17/2019 which showed  3 small tubular adenomatous polyps removed from the colon and diverticulosis to the sigmoid/descending colon.  No known family history of IBD or colorectal cancer.  She is also experiencing headaches and underwent a brain MRI which was unrevealing.  She was experiencing some palpitations and underwent a Zio patch which she reported showed brief runs of SVT and PVCs.  She was prescribed beta-blocker and scheduled for coronary CT scan 02/03/2023.     Latest Ref Rng & Units 12/30/2022    9:55 AM 10/21/2022    5:56 AM 09/01/2022    3:30 PM  CBC  WBC 4.0 - 10.5 K/uL 8.5  10.7  11.1   Hemoglobin 12.0 - 15.0 g/dL 96.2  95.2  84.1   Hematocrit 36.0 - 46.0 % 38.9  36.5  39.9   Platelets 150.0 - 400.0 K/uL 410.0  377  424.0        Latest Ref Rng & Units 01/18/2023    3:30 PM 10/21/2022    5:56 AM 10/07/2022    3:15 PM  CMP  Glucose 70 - 99 mg/dL 89  324    BUN 8 - 27 mg/dL 14  13    Creatinine 4.01 - 1.00 mg/dL 0.27  2.53    Sodium 664 - 144 mmol/L 139  137    Potassium 3.5 - 5.2 mmol/L 4.3  3.6    Chloride 96 - 106 mmol/L 101  103    CO2 20 - 29 mmol/L 21  21    Calcium 8.7 - 10.3 mg/dL 40.3  9.9    Total Protein 6.0 - 8.5 g/dL   6.7     CTAP 4/74/2595: 1. No  acute intra-abdominal or pelvic pathology. 2. Sigmoid diverticulosis. 3. Aortic Atherosclerosis  RUQ sono 08/31/2022: FINDINGS: Gallbladder: No gallstones or wall thickening visualized. No sonographic Murphy sign noted by sonographer.   Common bile duct: Diameter: 2.4 mm   Liver: No focal lesion identified. Within normal limits in parenchymal echogenicity. Portal vein is patent on color Doppler imaging with normal direction of blood flow towards the liver.    IMPRESSION: No cholelithiasis or sonographic evidence for acute cholecystitis.  [facility] HIDA 11/13/2022: FINDINGS: Prompt uptake and biliary excretion of activity by the liver is seen. Gallbladder activity is visualized, consistent with patency of cystic duct. Biliary  activity passes into small bowel, consistent with patent common bile duct.   Calculated gallbladder ejection fraction is 15%. (Normal gallbladder ejection fraction with Ensure is greater than 33% and less than 80%.)   IMPRESSION: Reduced gallbladder ejection fraction compatible with chronic cholecystitis/biliary dyskinesia.  PAST GI PROCEDURES:  05/25/2022 EGD:  - LA Grade A reflux esophagitis with no bleeding. Biopsied.  - Normal stomach. Biopsied.  - Normal examined duodenum. Biopsied.   Diagnosis 1. Surgical [P], duodenal biopsies - BENIGN SMALL BOWEL MUCOSA WITH NO SIGNIFICANT PATHOLOGIC CHANGES 2. Surgical [P], fundus, gastric antrum, and gastric body - GASTRIC ANTRAL AND OXYNTIC MUCOSA WITH FEATURES OF MILD REACTIVE GASTROPATHY - NEGATIVE FOR H. PYLORI ON H&E STAIN - NEGATIVE FOR INTESTINAL METAPLASIA OR MALIGNANCY 3. Surgical [P], distal esophagus - BENIGN SQUAMOUS AND GASTRIC CARDIAC MUCOSA WITH NO SPECIFIC PATHOLOGIC CHANGES - NEGATIVE FOR INCREASED INTRAEPITHELIAL EOSINOPHILS - NEGATIVE FOR INTESTINAL METAPLASIA, DYSPLASIA OR MALIGNANCY 4. Surgical [P], mid esophagus and proximal esophagus - SQUAMOUS MUCOSA WITH NO SPECIFIC PATHOLOGIC CHANGES - NEGATIVE FOR INCREASED INTRAEPITHELIAL EOSINOPHILS - NEGATIVE FOR DYSPLASIA OR MALIGNANCY   Colonoscopy 04/17/2019:  - Diverticulosis in the sigmoid colon and in the descending colon.  - Three 1 mm polyps in the descending colon, at the hepatic flexure and in the ascending colon, removed with a cold snare. Resected and retrieved.  - Non-bleeding internal hemorrhoids.  - No obvious source for abdominal pain  - Recall colonoscopy 7 years  1. Surgical [P], fundus, gastric antrum and gastric body - ANTRAL AND OXYNTIC MUCOSA WITH SLIGHT CHRONIC INFLAMMATION. [place] NEGATIVE FOR HELICOBACTER PYLORI. - NO INTESTINAL METAPLASIA, DYSPLASIA OR CARCINOMA. 2. Surgical [P], distal esophagus BX - SQUAMOUS MUCOSA WITH MILD  REFLUX CHANGES. - NO EOSINOPHILIC ESOPHAGITIS (LESS THAN 10 PER HIGH POWER FIELD). - NO INTESTINAL METAPLASIA, DYSPLASIA OR CARCINOMA. 3. Surgical [P], mid esophagus and proximal esophagus BX - SQUAMOUS MUCOSA WITH HYPEREMIA. - NO EOSINOPHILIC ESOPHAGITIS (LESS THAN 10 PER HIGH POWER FIELD) 4. Surgical [P], colon, ascending, hepatic flexure, descending, polyp (3) - TUBULAR ADENOMA (TWO). - NO HIGH GRADE DYSPLASIA OR CARCINOMA. - COLONIC MUCOSA WITH BENIGN LYMPHOID AGGREGATE.  Past Medical History:  Diagnosis Date   Anxiety    Appendicitis 09/25/2013   Basal cell carcinoma    Biliary dyskinesia    Blood in stool    Chicken pox    Dysphagia    GERD (gastroesophageal reflux disease)    High blood pressure    HLD (hyperlipidemia)    Mitral valve prolapse    Osteoporosis    SVT (supraventricular tachycardia) (HCC)    Past Surgical History:  Procedure Laterality Date   APPENDECTOMY  2015   Open appendectomy with right colectomy   BREAST BIOPSY Right 2001   benign   COLON SURGERY  2015   right hemicolectomy done at time of appendectomy   LAPAROSCOPIC APPENDECTOMY  N/A 09/25/2013   Procedure:  LAPAROSCOPIC APPENDECTOMY CONVERTED TO LAPAROSCOPIC ASSISTED RIGHT COLECTOMY;  Surgeon: Atilano Ina, MD;  Location: [facility] OR;  Service: General;  Laterality: N/A;   TONSILLECTOMY     Current Medications, Allergies, Past Medical History, Past Surgical History, Family History and Social History were reviewed in [employer] record.  Review of Systems:   Constitutional: See HPI.  Respiratory: Negative for shortness of breath.   Cardiovascular: Negative for chest pain, palpitations and leg swelling.  Gastrointestinal: See HPI.  Musculoskeletal: Negative for back pain or muscle aches.  Neurological: Negative for dizziness, headaches or paresthesias.   Physical Exam: BP 110/80 (BP Location: Left Arm, Patient Position: Sitting, Cuff Size: Normal)   Pulse 68   Ht 5\' 7"   (1.702 m)   Wt 129 lb 4 oz (58.6 kg)   BMI 20.24 kg/m   General: 67 year old female in no acute distress. Head: Normocephalic and atraumatic. Eyes: No scleral icterus. Conjunctiva pink . Ears: Normal auditory acuity. Mouth: Dentition intact. No ulcers or lesions.  Lungs: Clear throughout to auscultation. Heart: Regular rate and rhythm, no murmur. Abdomen: Soft, nondistended.  Mild tenderness above the umbilicus without rebound or guarding.  No masses or hepatomegaly. Normal bowel sounds x 4 quadrants.  Rectal: Deferred.  Musculoskeletal: Symmetrical with no gross deformities. Extremities: No edema. Neurological: Alert oriented x 4. No focal deficits.  Psychological: Alert and cooperative. Normal mood and affect  Assessment and Recommendations:  67 year old female with chronic nausea, decreased appetite with intermittent epigastric pain which sometimes radiates above the umbilicus. Patient also endorses having early satiety at times and often feels full a few hours after eating a meal. CTAP 04/2022 was unrevealing. EGD 05/2022 showed grade a reflux esophagitis and mild reactive gastropathy without evidence of H. pylori.  RUQ sonogram 08/2022 was normal.  Normal LFTs. CCK HIDA 10/2022 showed a gallbladder ejection fracture 15% consistent with gallbladder dyskinesia. Patient was evaluated by Dr. Sophronia Simas who assessed the patient's epigastric and RUQ pains were likely due to biliary dyskinesia and that her nausea and decreased appetite may due to other GI disorder.  Elective laparoscopic cholecystectomy was recommended. -Patient to consider proceeding with a laparoscopic cholecystectomy if her coronary CT is unremarkable.  Patient requesting input from Dr. Myrtie Neither regarding his recommendations regarding a future cholecystectomy. -I discussed scheduling a gastric emptying study to rule out gastroparesis which could potentially explain her nausea and early satiety, however, the patient does not wish  to schedule at this time -Avoid spicy/fatty foods  GERD, reflux esophagitis. EGD 05/2022 showed grade a reflux esophagitis and mild reactive gastropathy without evidence of H. pylori.  -Continue Famotidine 20 mg twice daily  History of colon polyps.  Colonoscopy 03/2019 identified 3 small tubular adenomatous polyps removed from the colon. -Next colonoscopy due 03/2026  History of MVP, SVT, palpitations, evaluated by cardiology who prescribed Propranolol as needed.  Coronary CTA scheduled 02/03/2023.

## 2023-01-20 ENCOUNTER — Telehealth: Payer: Self-pay

## 2023-01-20 NOTE — Progress Notes (Signed)
Donna Hawkins, pls contact patient and let her know Dr. Myrtie Neither' recommendations as noted in the addendum note below. If patient is willing, pls schedule her for a gastric empty study. THX.

## 2023-01-20 NOTE — Telephone Encounter (Signed)
Pt was made aware of Dr. Myrtie Neither and Alcide Evener NP recommendations in regard to Dr. Myrtie Neither Addendum. Pt stated that she would like to think about the scan and do some research on it and will call us back if she wants to proceed with it.  Pt verbalized understanding with all questions answered.

## 2023-01-20 NOTE — Progress Notes (Signed)
____________________________________________________________  Attending physician addendum:  Thank you for sending this case to me. I have reviewed the entire note and agree with the plan.  I agree with our surgical colleague that the pain does sound like biliary colic, but that the nausea and early satiety are less typical gallbladder symptoms.  Therefore I also think a gastric emptying study is worth doing.  If it is normal, there will still be uncertainty about the degree to which the nausea and early satiety would be relieved after cholecystectomy.  However, she would then have had a thorough workup (including the previous testing you diligently outlined), and can then make a more informed decision about the risks and benefits of cholecystectomy.  Amada Jupiter, MD  ____________________________________________________________

## 2023-01-20 NOTE — Telephone Encounter (Signed)
Message Received: Today Arnaldo Natal, NP  Emeline Darling, RN      Previous Messages  Routed Note  Author: Arnaldo Natal, NP Service: Gastroenterology Author Type: Nurse Practitioner  Filed: 01/20/2023  9:19 AM Encounter Date: 01/19/2023 Status: Signed  Editor: Arnaldo Natal, NP (Nurse Practitioner)   Viviann Spare, pls contact patient and let her know Dr. Myrtie Neither' recommendations as noted in the addendum note below. If patient is willing, pls schedule her for a gastric empty study. THX.

## 2023-01-26 ENCOUNTER — Encounter: Payer: Self-pay | Admitting: Family Medicine

## 2023-01-26 ENCOUNTER — Other Ambulatory Visit: Payer: Self-pay | Admitting: Family Medicine

## 2023-01-26 ENCOUNTER — Other Ambulatory Visit: Payer: Self-pay

## 2023-01-26 DIAGNOSIS — I10 Essential (primary) hypertension: Secondary | ICD-10-CM

## 2023-01-26 MED ORDER — LOSARTAN POTASSIUM 25 MG PO TABS: 25.0000 mg | ORAL_TABLET | Freq: Every day | ORAL | 0 refills | Status: DC

## 2023-01-26 MED ORDER — LOSARTAN POTASSIUM 25 MG PO TABS
25.0000 mg | ORAL_TABLET | Freq: Every day | ORAL | 0 refills | Status: DC
Start: 2023-01-26 — End: 2023-01-26
  Filled 2023-01-26: qty 90, 90d supply, fill #0

## 2023-02-03 ENCOUNTER — Ambulatory Visit (HOSPITAL_COMMUNITY): Payer: Medicare PPO

## 2023-03-09 ENCOUNTER — Encounter: Payer: Self-pay | Admitting: Dermatology

## 2023-03-09 ENCOUNTER — Ambulatory Visit: Payer: Medicare PPO | Admitting: Dermatology

## 2023-03-09 VITALS — BP 128/77 | HR 63

## 2023-03-09 DIAGNOSIS — R202 Paresthesia of skin: Secondary | ICD-10-CM

## 2023-03-09 DIAGNOSIS — Z85828 Personal history of other malignant neoplasm of skin: Secondary | ICD-10-CM

## 2023-03-09 DIAGNOSIS — L821 Other seborrheic keratosis: Secondary | ICD-10-CM | POA: Diagnosis not present

## 2023-03-09 DIAGNOSIS — Z808 Family history of malignant neoplasm of other organs or systems: Secondary | ICD-10-CM

## 2023-03-09 NOTE — Patient Instructions (Signed)
 Place in fridge  Use on back for notalgia paresthetica   Important Information  Due to recent changes in healthcare laws, you may see results of your pathology and/or laboratory studies on MyChart before the doctors have had a chance to review them. We understand that in some cases there may be results that are confusing or concerning to you. Please understand that not all results are received at the same time and often the doctors may need to interpret multiple results in order to provide you with the best plan of care or course of treatment. Therefore, we ask that you please give us  2 business days to thoroughly review all your results before contacting the office for clarification. Should we see a critical lab result, you will be contacted sooner.   If You Need Anything After Your Visit  If you have any questions or concerns for your doctor, please call our main line at [phone] If no one answers, please leave a voicemail as directed and we will return your call as soon as possible. Messages left after 4 pm will be answered the following business day.   You may also send us  a message via MyChart. We typically respond to MyChart messages within 1-2 business days.  For prescription refills, please ask your pharmacy to contact our office. Our fax number is [phone].  If you have an urgent issue when the clinic is closed that cannot wait until the next business day, you can page your doctor at the number below.    Please note that while we do our best to be available for urgent issues outside of office hours, we are not available 24/7.   If you have an urgent issue and are unable to reach us , you may choose to seek medical care at your doctor's office, retail clinic, urgent care center, or emergency room.  If you have a medical emergency, please immediately call 911 or go to the emergency department. In the event of inclement weather, please call our main line at [phone] for an  update on the status of any delays or closures.  Dermatology Medication Tips: Please keep the boxes that topical medications come in in order to help keep track of the instructions about where and how to use these. Pharmacies typically print the medication instructions only on the boxes and not directly on the medication tubes.   If your medication is too expensive, please contact our office at [phone] or send us  a message through MyChart.   We are unable to tell what your co-pay for medications will be in advance as this is different depending on your insurance coverage. However, we may be able to find a substitute medication at lower cost or fill out paperwork to get insurance to cover a needed medication.   If a prior authorization is required to get your medication covered by your insurance company, please allow us  1-2 business days to complete this process.  Drug prices often vary depending on where the prescription is filled and some pharmacies may offer cheaper prices.  The website www.goodrx.com contains coupons for medications through different pharmacies. The prices here do not account for what the cost may be with help from insurance (it may be cheaper with your insurance), but the website can give you the price if you did not use any insurance.  - You can print the associated coupon and take it with your prescription to the pharmacy.  - You may also stop by our office during  regular business hours and pick up a GoodRx coupon card.  - If you need your prescription sent electronically to a different pharmacy, notify our office through [facility] or by phone at [phone]    Skin Education :   I counseled the patient regarding the following: Sun screen (SPF 30 or greater) should be applied during peak UV exposure (between 10am and 2pm) and reapplied after exercise or swimming.  The ABCDEs of melanoma were reviewed with the patient, and the importance of monthly  self-examination of moles was emphasized. Should any moles change in shape or color, or itch, bleed or burn, pt will contact our office for evaluation sooner then their interval appointment.  Plan: Sunscreen Recommendations I recommended a broad spectrum sunscreen with a SPF of 30 or higher. I explained that SPF 30 sunscreens block approximately 97 percent of the sun's harmful rays. Sunscreens should be applied at least 15 minutes prior to expected sun exposure and then every 2 hours after that as long as sun exposure continues. If swimming or exercising sunscreen should be reapplied every 45 minutes to an hour after getting wet or sweating. One ounce, or the equivalent of a shot glass full of sunscreen, is adequate to protect the skin not covered by a bathing suit. I also recommended a lip balm with a sunscreen as well. Sun protective clothing can be used in lieu of sunscreen but must be worn the entire time you are exposed to the sun's rays.

## 2023-03-09 NOTE — Progress Notes (Signed)
   New Patient Visit   Subjective  Donna Hawkins is a 68 y.o. female who presents for the following: spot check.  Hx of BCC. Father hx of MM.  The patient has spots, moles and lesions to be evaluated, some may be new or changing.  Itching on back, present for years, previously given topical steroids with limited improvement.  The following portions of the chart were reviewed this encounter and updated as appropriate: medications, allergies, medical history  Review of Systems:  No other skin or systemic complaints except as noted in HPI or Assessment and Plan.  Objective  Well appearing patient in no apparent distress; mood and affect are within normal limits.  A focused examination was performed of the following areas:  Face and back  Relevant exam findings are noted in the Assessment and Plan.  Left Forehead, Mid Forehead, Right Forehead Brown stuck on papules Left Upper Back, Right Upper Back Bilateral yellow to brown patches with with atrophic linear scar on bilateral upper backs  Assessment & Plan   NOTALGIA PARESTHETICA- bilateral upper back Exam: Perispinal hyperpigmented patch Chronic condition without cure secondary to pinched nerve along spine causing itching or sensation changes in an area of skin. Chronic rubbing or scratching causes darkening of the skin.  OTC treatments which can help with itch include numbing creams like pramoxine or lidocaine  which temporarily reduce itch or Capsaicin-containing creams which cause a burning sensation but which sometimes over time will reset the nerves to stop producing itch.  If you choose to use Capsaicin cream, it is recommended to use it 5 times daily for 1 week followed by 3 times daily for 3-6 weeks. You may have to continue using it long-term.  Recommend Sarna Lotion with menthol to be placed in the fridge  SEBORRHEIC KERATOSIS - Stuck-on, waxy, tan-brown papules and/or plaques  - Benign-appearing - Discussed  benign etiology and prognosis. - Observe - Call for any changes  SEBORRHEIC KERATOSIS (3) Left Forehead, Mid Forehead, Right Forehead NOTALGIA PARESTHETICA (2) Left Upper Back, Right Upper Back Use OTC Sarna lotion with menthol  Return if symptoms worsen or fail to improve.  LILLETTE Berwyn Baseman, Surg Tech III, am acting as scribe for RUFUS CHRISTELLA HOLY, MD.   Documentation: I have reviewed the above documentation for accuracy and completeness, and I agree with the above.  RUFUS CHRISTELLA HOLY, MD

## 2023-04-01 ENCOUNTER — Ambulatory Visit: Payer: Medicare PPO | Admitting: Family Medicine

## 2023-04-19 ENCOUNTER — Telehealth: Payer: Self-pay | Admitting: Psychiatry

## 2023-04-19 NOTE — Telephone Encounter (Signed)
 Pt's husband, Donna Hawkins said my wife prefers to cancel the appointment because there is no change in health.

## 2023-04-20 ENCOUNTER — Ambulatory Visit: Payer: Medicare PPO | Admitting: Neurology

## 2023-04-23 ENCOUNTER — Other Ambulatory Visit: Payer: Self-pay | Admitting: Cardiovascular Disease

## 2023-04-26 ENCOUNTER — Other Ambulatory Visit: Payer: Self-pay | Admitting: Family Medicine

## 2023-04-26 DIAGNOSIS — Z1231 Encounter for screening mammogram for malignant neoplasm of breast: Secondary | ICD-10-CM

## 2023-04-29 ENCOUNTER — Ambulatory Visit
Admission: RE | Admit: 2023-04-29 | Discharge: 2023-04-29 | Disposition: A | Discharge status: Home / Self Care | Source: Ambulatory Visit | Attending: Family Medicine | Admitting: Family Medicine

## 2023-04-29 DIAGNOSIS — Z1231 Encounter for screening mammogram for malignant neoplasm of breast: Secondary | ICD-10-CM

## 2023-05-04 ENCOUNTER — Ambulatory Visit: Payer: Medicare PPO | Admitting: Family Medicine

## 2023-05-04 ENCOUNTER — Encounter: Payer: Self-pay | Admitting: Family Medicine

## 2023-05-04 DIAGNOSIS — G4700 Insomnia, unspecified: Secondary | ICD-10-CM

## 2023-05-04 DIAGNOSIS — I10 Essential (primary) hypertension: Secondary | ICD-10-CM

## 2023-05-04 MED ORDER — ALPRAZOLAM 0.25 MG PO TABS: 0.2500 mg | ORAL_TABLET | Freq: Every evening | ORAL | 2 refills | Status: AC | PRN

## 2023-05-04 MED ORDER — LOSARTAN POTASSIUM 25 MG PO TABS
25.0000 mg | ORAL_TABLET | Freq: Every day | ORAL | 1 refills | Status: DC
Start: 2023-05-04 — End: 2023-10-29

## 2023-05-04 NOTE — Assessment & Plan Note (Signed)
 BP toda is borderline, she usually has good BP at home and at other doctor's offices, will continue losartan 25 mg daily and propranolol 20 mg BID. Refills sent

## 2023-05-04 NOTE — Assessment & Plan Note (Signed)
 Chronic, Taking 1/2- 1 alprazolam daily as needed, reviewed PDMP, this is chronic for her, she reports that her symptoms are stable at this time, will refill this for her today

## 2023-05-04 NOTE — Patient Instructions (Signed)
 Ask your insurance first if they cover the cost of the immune titer testing for the measles.

## 2023-05-04 NOTE — Progress Notes (Signed)
 Established Patient Office Visit  Subjective   Patient ID: Donna Hawkins, female    DOB: Jun 12, 1955  Age: 68 y.o. MRN: [id number]  Chief Complaint  Patient presents with   Medical Management of Chronic Issues    Pt is here for follow up on her blood pressure. She reports that the propranolol is working to help her with the palpitations. Pt reports that she does feel some better since the last visit. States that she continues to have this little headache in the same spot on her head that comes and goes. States she had COVID in January and this caused a set back, but over christmas she was doing very well.   I reviewed notes from the cardiologist and the gastroenterologist  and noted their recommendations.pt states she cancelled the CTA coronary because she didn't feel that it was necessary.   HTN -- BP in office performed and is borderline elevated. She  reports no side effects to the medications, no chest pain, SOB, dizziness or headaches. She has a BP cuff at home and is checking BP regularly, reports they are in the normal range. She had 2 pressure readings in the chart previously that were in the normal range.        Current Outpatient Medications  Medication Instructions   ALPRAZolam (XANAX) 0.25 mg, Oral, At bedtime PRN   ascorbic acid (VITAMIN C) 500 mg, Daily   bismuth subsalicylate (PEPTO BISMOL) 262 MG/15ML suspension 30 mLs, Daily at bedtime   Cholecalciferol (VITAMIN D3 PO) 2,000 Units, Daily   Coral Calcium 1,000 mg,  Once   famotidine (PEPCID) 20 mg, 2 times daily   losartan (COZAAR) 25 mg, Oral, Daily   MAGNESIUM GLYCINATE PO 400 Units, Daily   Menaquinone-7 (VITAMIN K2 PO) Daily   propranolol (INDERAL) 20 mg, Oral, 2 times daily    Patient Active Problem List   Diagnosis Date Noted   Insomnia 12/30/2022   Subclinical hypothyroidism 09/01/2022   HTN (hypertension) 08/14/2022   Shortness of breath 08/21/2021   Atherosclerosis 08/21/2021   Age-related  osteoporosis without current pathological fracture 02/28/2020   Tachycardia 02/28/2020   Anxiety 02/28/2020   Mitral valve prolapse 05/04/2019   Encounter for other preprocedural examination 04/03/2019   S/P right colectomy 09/26/2013      Review of Systems  All other systems reviewed and are negative.     Objective:     BP (!) 140/100   Pulse 96   Temp 97.8 F (36.6 C) (Oral)   Ht 5\' 7"  (1.702 m)   Wt 134 lb 3.2 oz (60.9 kg)   SpO2 97%   BMI 21.02 kg/m    Physical Exam Vitals reviewed.  Constitutional:      Appearance: Normal appearance. She is normal weight.  Cardiovascular:     Rate and Rhythm: Normal rate and regular rhythm.     Heart sounds: Normal heart sounds. No murmur heard. Pulmonary:     Effort: Pulmonary effort is normal.     Breath sounds: Normal breath sounds. No wheezing.  Neurological:     Mental Status: She is alert and oriented to person, place, and time. Mental status is at baseline.  Psychiatric:        Mood and Affect: Mood normal.        Behavior: Behavior normal.      No results found for any visits on 05/04/23.    The 10-year ASCVD risk score (Arnett DK, et al., 2019) is: 13%  Assessment & Plan:  Primary hypertension Assessment & Plan: BP toda is borderline, she usually has good BP at home and at other doctor's offices, will continue losartan 25 mg daily and propranolol 20 mg BID. Refills sent  Orders: -     Losartan Potassium; Take 1 tablet (25 mg total) by mouth daily.  Dispense: 90 tablet; Refill: 1  Insomnia, unspecified type Assessment & Plan: Chronic, Taking 1/2- 1 alprazolam daily as needed, reviewed PDMP, this is chronic for her, she reports that her symptoms are stable at this time, will refill this for her today  Orders: -     ALPRAZolam; Take 1 tablet (0.25 mg total) by mouth at bedtime as needed for anxiety or sleep.  Dispense: 30 tablet; Refill: 2     Return in about 6 months (around 11/04/2023).     Karie Georges, MD

## 2023-09-06 ENCOUNTER — Encounter (HOSPITAL_COMMUNITY): Payer: Self-pay

## 2023-09-06 ENCOUNTER — Ambulatory Visit (HOSPITAL_COMMUNITY)
Admission: EM | Admit: 2023-09-06 | Discharge: 2023-09-06 | Disposition: A | Discharge status: Home / Self Care | Source: Ambulatory Visit | Attending: Internal Medicine | Admitting: Internal Medicine

## 2023-09-06 ENCOUNTER — Ambulatory Visit (HOSPITAL_COMMUNITY): Admission: RE | Admit: 2023-09-06 | Source: Other Acute Inpatient Hospital

## 2023-09-06 ENCOUNTER — Ambulatory Visit (HOSPITAL_COMMUNITY)
Admission: RE | Admit: 2023-09-06 | Discharge: 2023-09-06 | Disposition: A | Discharge status: Home / Self Care | Source: Ambulatory Visit | Attending: Internal Medicine

## 2023-09-06 ENCOUNTER — Ambulatory Visit (HOSPITAL_COMMUNITY)
Admission: EM | Admit: 2023-09-06 | Discharge: 2023-09-06 | Disposition: A | Discharge status: Home / Self Care | Attending: Internal Medicine | Admitting: Internal Medicine

## 2023-09-06 DIAGNOSIS — M25561 Pain in right knee: Secondary | ICD-10-CM | POA: Insufficient documentation

## 2023-09-06 DIAGNOSIS — S80211A Abrasion, right knee, initial encounter: Secondary | ICD-10-CM

## 2023-09-06 DIAGNOSIS — M25461 Effusion, right knee: Secondary | ICD-10-CM | POA: Insufficient documentation

## 2023-09-06 DIAGNOSIS — S8001XA Contusion of right knee, initial encounter: Secondary | ICD-10-CM | POA: Insufficient documentation
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
## 2023-09-06 NOTE — Discharge Instructions (Addendum)
 Please go to [facility] ER to have imaging performed of the knee.  Do not check in to the ER. Show them the order form, tell them you are there to have an x-ray done from urgent care and do not need to see a doctor. They will call you from the lobby back to the radiology department to do your x-ray. Once x-rays are completed, you may go home.   You will receive a phone call if the x-ray shows any abnormal results requiring further treatment. If the x-ray results do not change our treatment plan, you will not receive a phone call.  Also see these results on MyChart.  Take tylenol  1,000mg  every 6 hours as needed for pain.   Ice to the right knee 20 minutes on 20 minutes off as needed for swelling for the first 24-48 hours, then heat.  Wear the ACE wrap for compression of the right knee to reduce swelling.  Follow-up with [facility] as needed.   If you develop any new or worsening symptoms or if your symptoms do not start to improve, please return here or follow-up with your primary care provider. If your symptoms are severe, please go to the emergency room.
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
## 2023-09-07 ENCOUNTER — Ambulatory Visit (HOSPITAL_COMMUNITY): Payer: Self-pay

## 2023-09-09 ENCOUNTER — Ambulatory Visit: Payer: Self-pay

## 2023-09-09 NOTE — Telephone Encounter (Signed)
 Noted- ok to close.
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
## 2023-09-13 ENCOUNTER — Encounter: Payer: Self-pay | Admitting: Family Medicine

## 2023-09-13 ENCOUNTER — Telehealth: Payer: Self-pay | Admitting: Family Medicine

## 2023-09-13 ENCOUNTER — Ambulatory Visit: Payer: Self-pay | Admitting: Family Medicine

## 2023-09-13 ENCOUNTER — Inpatient Hospital Stay: Admitting: Family Medicine

## 2023-09-13 ENCOUNTER — Ambulatory Visit: Admitting: Family Medicine

## 2023-09-13 VITALS — BP 138/80 | HR 76 | Temp 97.8°F | Resp 16 | Ht 67.0 in | Wt 140.5 lb

## 2023-09-13 DIAGNOSIS — S8001XA Contusion of right knee, initial encounter: Secondary | ICD-10-CM | POA: Diagnosis not present

## 2023-09-13 DIAGNOSIS — S8992XD Unspecified injury of left lower leg, subsequent encounter: Secondary | ICD-10-CM | POA: Diagnosis not present

## 2023-09-13 LAB — CBC
HCT: 34.6 % — ABNORMAL LOW (ref 36.0–46.0)
Hemoglobin: 12 g/dL (ref 12.0–15.0)
MCHC: 34.6 g/dL (ref 30.0–36.0)
MCV: 91.4 fl (ref 78.0–100.0)
Platelets: 362 K/uL (ref 150.0–400.0)
RBC: 3.78 Mil/uL — ABNORMAL LOW (ref 3.87–5.11)
RDW: 13.1 % (ref 11.5–15.5)
WBC: 8.7 K/uL (ref 4.0–10.5)

## 2023-09-13 LAB — PROTIME-INR
INR: 1.1 ratio — ABNORMAL HIGH (ref 0.8–1.0)
Prothrombin Time: 11.4 s (ref 9.6–13.1)
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
## 2023-09-13 NOTE — Patient Instructions (Addendum)
 A few things to remember from today's visit:  Hematoma of right knee region - Plan: CBC, Protime-INR  Knee injury, left, subsequent encounter - Plan: CBC Sun protection, continue daily activities as tolerated.\Fall precautions. Elevation a few times per day.  Do not use My Chart to request refills or for acute issues that need immediate attention. If you send a my chart message, it may take a few days to be addressed, specially if I am not in the office.  Please be sure medication list is accurate. If a new problem present, please set up appointment sooner than planned today.

## 2023-09-16 DIAGNOSIS — M25561 Pain in right knee: Secondary | ICD-10-CM | POA: Diagnosis not present

## 2023-10-29 ENCOUNTER — Other Ambulatory Visit: Payer: Self-pay | Admitting: Student

## 2023-10-29 ENCOUNTER — Other Ambulatory Visit: Payer: Self-pay | Admitting: Family Medicine

## 2023-10-29 DIAGNOSIS — I10 Essential (primary) hypertension: Secondary | ICD-10-CM

## 2023-11-09 ENCOUNTER — Ambulatory Visit: Admitting: Family Medicine

## 2023-12-15 ENCOUNTER — Ambulatory Visit: Admitting: Family Medicine

## 2023-12-15 ENCOUNTER — Encounter: Payer: Self-pay | Admitting: Family Medicine

## 2023-12-15 ENCOUNTER — Encounter: Payer: Self-pay | Admitting: *Deleted

## 2023-12-15 ENCOUNTER — Other Ambulatory Visit: Payer: Self-pay | Admitting: Student

## 2023-12-15 VITALS — BP 160/108 | HR 67 | Temp 97.6°F | Ht 67.0 in | Wt 137.1 lb

## 2023-12-15 DIAGNOSIS — I10 Essential (primary) hypertension: Secondary | ICD-10-CM | POA: Diagnosis not present

## 2023-12-15 DIAGNOSIS — R252 Cramp and spasm: Secondary | ICD-10-CM | POA: Diagnosis not present

## 2023-12-15 DIAGNOSIS — E038 Other specified hypothyroidism: Secondary | ICD-10-CM | POA: Diagnosis not present

## 2023-12-15 DIAGNOSIS — E782 Mixed hyperlipidemia: Secondary | ICD-10-CM

## 2023-12-15 DIAGNOSIS — R7303 Prediabetes: Secondary | ICD-10-CM | POA: Diagnosis not present

## 2023-12-15 DIAGNOSIS — Z23 Encounter for immunization: Secondary | ICD-10-CM | POA: Diagnosis not present

## 2023-12-15 LAB — COMPREHENSIVE METABOLIC PANEL WITH GFR
ALT: 14 U/L (ref 0–35)
AST: 16 U/L (ref 0–37)
Albumin: 4.7 g/dL (ref 3.5–5.2)
Alkaline Phosphatase: 79 U/L (ref 39–117)
BUN: 13 mg/dL (ref 6–23)
CO2: 27 meq/L (ref 19–32)
Calcium: 9.6 mg/dL (ref 8.4–10.5)
Chloride: 99 meq/L (ref 96–112)
Creatinine, Ser: 0.7 mg/dL (ref 0.40–1.20)
GFR: 88.1 mL/min (ref >60.00)
Glucose, Bld: 95 mg/dL (ref 70–99)
Potassium: 3.9 meq/L (ref 3.5–5.1)
Sodium: 134 meq/L — ABNORMAL LOW (ref 135–145)
Total Bilirubin: 0.8 mg/dL (ref 0.2–1.2)
Total Protein: 7.4 g/dL (ref 6.0–8.3)

## 2023-12-15 LAB — CK: Total CK: 64 U/L (ref 17–177)

## 2023-12-15 LAB — LIPID PANEL
Cholesterol: 238 mg/dL — ABNORMAL HIGH (ref 0–200)
HDL: 65.4 mg/dL (ref >39.00)
LDL Cholesterol: 157 mg/dL — ABNORMAL HIGH (ref 0–99)
NonHDL: 172.23
Total CHOL/HDL Ratio: 4
Triglycerides: 76 mg/dL (ref 0.0–149.0)
VLDL: 15.2 mg/dL (ref 0.0–40.0)

## 2023-12-15 LAB — TSH: TSH: 3.14 u[IU]/mL (ref 0.35–5.50)

## 2023-12-15 LAB — HEMOGLOBIN A1C: Hgb A1c MFr Bld: 6 % (ref 4.6–6.5)

## 2023-12-15 NOTE — Progress Notes (Signed)
 Established Patient Office Visit  Subjective   Patient ID: Donna Hawkins, female    DOB: [birth year]  Age: 68 y.o. MRN: [id number]  Chief Complaint  Patient presents with   Medical Management of Chronic Issues    HPI Discussed the use of AI scribe software for clinical note transcription with the patient, who gave verbal consent to proceed.  History of Present Illness   Donna Hawkins is a 68 year old female who presents with persistent abdominal pain and dizziness.  She experiences cramping abdominal pain for over a year, particularly at night when her stomach is empty. Drinking water worsens the pain, while fatty meals do not affect it. Omeprazole  alleviates heartburn but not the cramping pain. A HIDA scan showed some abnormality, but she has not pursued gallbladder removal.  She experiences persistent, mild dizziness, described as feeling 'dizzyish most of the time.' A past severe episode of vertigo is noted in the past, but current dizziness is less intense. She describes it as always being there but denies any room spinning or falls.   She experiences frequent leg cramps at night and takes magnesium. Electrolytes were previously normal. She has reduced physical activity due to not feeling well.  Current medications include propranolol  10 mg nightly and losartan  25 mg daily for blood pressure management. Home blood pressure readings are normal, despite elevated readings in the office.   Chronic insomnia-- pt reports she is only taking the alprazolam  as needed, states she still has the last script she filled in August. Not taking is frequently at all.      Current Outpatient Medications  Medication Instructions   ALPRAZolam  (XANAX ) 0.25 mg, Oral, At bedtime PRN   ascorbic acid (VITAMIN C) 500 mg, Daily   bismuth subsalicylate (PEPTO BISMOL) 262 MG/15ML suspension 30 mLs, Daily at bedtime   Cholecalciferol (VITAMIN D3 PO) 2,000 Units, Daily   Coral Calcium 1,000  mg,  Once   losartan  (COZAAR ) 25 mg, Oral, Daily   MAGNESIUM GLYCINATE PO 400 Units, Daily   MELATONIN PO 1 mg, Daily at bedtime   Menaquinone-7 (VITAMIN K2 PO) Daily   propranolol  (INDERAL ) 20 mg, Oral, 2 times daily    Patient Active Problem List   Diagnosis Date Noted   Insomnia 12/30/2022   Subclinical hypothyroidism 09/01/2022   HTN (hypertension) 08/14/2022   Shortness of breath 08/21/2021   Atherosclerosis 08/21/2021   Age-related osteoporosis without current pathological fracture 02/28/2020   Tachycardia 02/28/2020   Anxiety 02/28/2020   Mitral valve prolapse 05/04/2019   Encounter for other preprocedural examination 04/03/2019   S/P right colectomy 09/26/2013     Review of Systems  All other systems reviewed and are negative.     Objective:     BP (!) 160/108   Pulse 67   Temp 97.6 F (36.4 C) (Oral)   Ht 5' 7 (1.702 m)   Wt 137 lb 1.6 oz (62.2 kg)   SpO2 98%   BMI 21.47 kg/m     Physical Exam Vitals reviewed.  Constitutional:      Appearance: Normal appearance. She is well-groomed and normal weight.  Eyes:     Conjunctiva/sclera: Conjunctivae normal.  Neck:     Thyroid : No thyromegaly.  Cardiovascular:     Rate and Rhythm: Normal rate and regular rhythm.     Pulses: Normal pulses.     Heart sounds: S1 normal and S2 normal.  Pulmonary:     Effort: Pulmonary effort is normal.  Breath sounds: Normal breath sounds and air entry.  Abdominal:     General: Bowel sounds are normal.  Musculoskeletal:     Right lower leg: No edema.     Left lower leg: No edema.  Neurological:     Mental Status: She is alert and oriented to person, place, and time. Mental status is at baseline.     Gait: Gait is intact.  Psychiatric:        Mood and Affect: Mood and affect normal.        Speech: Speech normal.        Behavior: Behavior normal.        Judgment: Judgment normal.      No results found for any visits on 12/15/23.     The 10-year ASCVD risk  score (Arnett DK, et al., 2019) is: 16.7%    Assessment & Plan:  Primary hypertension -     Comprehensive metabolic panel with GFR; Future  Subclinical hypothyroidism -     TSH; Future  Mixed hyperlipidemia -     Lipid panel; Future  Prediabetes -     Hemoglobin A1c; Future  Muscle cramps at night -     CK; Future  Immunization due -     Flu vaccine HIGH DOSE PF(Fluzone Trivalent)   Assessment and Plan    Chronic abdominal pain Chronic abdominal pain with cramping, not typical of gallbladder issues. Symptoms do not correlate with fatty food intake. Previous HIDA scan indicated possible gallbladder dysfunction, but symptoms are not classic for gallbladder disease. Negative for H. pylori. Previous GI evaluation suggested gastric emptying test, which was not pursued. Omeprazole  relieved heartburn but not cramping pain. Discussed 50% chance of symptom relief with gallbladder removal. Weight is stable, no vomiting or severe diarrhea, will continue to monitor her symptoms.   Dizziness Chronic mild dizziness, not severe. Possible vestibular or cardiovascular origin. Blood pressure and cardiac function considered as potential causes. Dizziness not typically related to gallbladder issues. - Monitor blood pressure at home  Muscle cramps Frequent nocturnal leg cramps. Currently taking magnesium. Electrolytes previously normal. Considering muscle breakdown as a potential cause. - Order CK to assess for muscle breakdown - Continue magnesium supplementation  Essential hypertension Blood pressure slightly elevated in office which is attributed to white coat syndrome, normal at home. Currently on losartan  and propranolol  (10 mg nightly). - Continue current antihypertensive regimen - Monitor blood pressure at home -Ordering her annual labs today, lipid panel, CMP, TSH, and A1C screening.  Osteoporosis Osteoporosis diagnosed in 2022. No current pharmacological treatment due to concerns about  side effects. Discussed Prolia as an alternative to oral bisphosphonates, highlighting its lower risk of osteonecrosis of the jaw. High risk of fracture due to current bone density status. - Discuss potential initiation of Prolia if agreeable - Pt declined DEXA scan today.   Return in about 6 months (around 06/14/2024) for annual physical exam, AWV with me in person.    Heron CHRISTELLA Sharper, MD

## 2023-12-20 ENCOUNTER — Ambulatory Visit: Payer: Self-pay | Admitting: Family Medicine

## 2023-12-22 ENCOUNTER — Other Ambulatory Visit: Payer: Self-pay | Admitting: Student

## 2023-12-22 MED ORDER — PROPRANOLOL HCL 20 MG PO TABS: 20.0000 mg | ORAL_TABLET | Freq: Two times a day (BID) | ORAL | 0 refills | Status: AC

## 2024-03-20 NOTE — Telephone Encounter (Signed)
 This encounter was created in error - please disregard.

## 2024-06-14 ENCOUNTER — Ambulatory Visit: Admitting: Family Medicine
